# Patient Record
Sex: Female | Born: 1950 | Race: White | Hispanic: No | Marital: Married | Smoking: Never smoker
Health system: Southern US, Community
[De-identification: ages and names within clinical notes are randomized; demographics above are authoritative.]

## PROBLEM LIST (undated history)

## (undated) DIAGNOSIS — E119 Type 2 diabetes mellitus without complications: Secondary | ICD-10-CM

## (undated) DIAGNOSIS — M1990 Unspecified osteoarthritis, unspecified site: Secondary | ICD-10-CM

## (undated) DIAGNOSIS — R05 Cough: Secondary | ICD-10-CM

## (undated) DIAGNOSIS — R062 Wheezing: Secondary | ICD-10-CM

## (undated) DIAGNOSIS — E039 Hypothyroidism, unspecified: Secondary | ICD-10-CM

## (undated) DIAGNOSIS — F329 Major depressive disorder, single episode, unspecified: Secondary | ICD-10-CM

## (undated) DIAGNOSIS — G459 Transient cerebral ischemic attack, unspecified: Secondary | ICD-10-CM

## (undated) DIAGNOSIS — K219 Gastro-esophageal reflux disease without esophagitis: Secondary | ICD-10-CM

## (undated) DIAGNOSIS — R0600 Dyspnea, unspecified: Secondary | ICD-10-CM

## (undated) DIAGNOSIS — S82001A Unspecified fracture of right patella, initial encounter for closed fracture: Secondary | ICD-10-CM

## (undated) DIAGNOSIS — R0789 Other chest pain: Secondary | ICD-10-CM

## (undated) DIAGNOSIS — I10 Essential (primary) hypertension: Secondary | ICD-10-CM

## (undated) DIAGNOSIS — Z8719 Personal history of other diseases of the digestive system: Secondary | ICD-10-CM

## (undated) DIAGNOSIS — H9190 Unspecified hearing loss, unspecified ear: Secondary | ICD-10-CM

## (undated) DIAGNOSIS — J189 Pneumonia, unspecified organism: Secondary | ICD-10-CM

## (undated) DIAGNOSIS — I499 Cardiac arrhythmia, unspecified: Secondary | ICD-10-CM

## (undated) DIAGNOSIS — E785 Hyperlipidemia, unspecified: Secondary | ICD-10-CM

## (undated) DIAGNOSIS — I639 Cerebral infarction, unspecified: Secondary | ICD-10-CM

## (undated) DIAGNOSIS — E079 Disorder of thyroid, unspecified: Secondary | ICD-10-CM

## (undated) DIAGNOSIS — R519 Headache, unspecified: Secondary | ICD-10-CM

## (undated) DIAGNOSIS — E7800 Pure hypercholesterolemia, unspecified: Secondary | ICD-10-CM

## (undated) DIAGNOSIS — D649 Anemia, unspecified: Secondary | ICD-10-CM

## (undated) DIAGNOSIS — R740 Nonspecific elevation of levels of transaminase and lactic acid dehydrogenase [LDH]: Secondary | ICD-10-CM

## (undated) DIAGNOSIS — G5762 Lesion of plantar nerve, left lower limb: Secondary | ICD-10-CM

## (undated) DIAGNOSIS — E669 Obesity, unspecified: Secondary | ICD-10-CM

## (undated) DIAGNOSIS — J3089 Other allergic rhinitis: Secondary | ICD-10-CM

## (undated) DIAGNOSIS — K449 Diaphragmatic hernia without obstruction or gangrene: Secondary | ICD-10-CM

## (undated) DIAGNOSIS — R7401 Elevation of levels of liver transaminase levels: Secondary | ICD-10-CM

## (undated) DIAGNOSIS — Z87410 Personal history of cervical dysplasia: Secondary | ICD-10-CM

## (undated) DIAGNOSIS — R053 Chronic cough: Secondary | ICD-10-CM

## (undated) DIAGNOSIS — F32A Depression, unspecified: Secondary | ICD-10-CM

## (undated) DIAGNOSIS — R51 Headache: Secondary | ICD-10-CM

## (undated) DIAGNOSIS — F0390 Unspecified dementia without behavioral disturbance: Secondary | ICD-10-CM

## (undated) DIAGNOSIS — G43809 Other migraine, not intractable, without status migrainosus: Secondary | ICD-10-CM

## (undated) DIAGNOSIS — J40 Bronchitis, not specified as acute or chronic: Secondary | ICD-10-CM

## (undated) DIAGNOSIS — I82409 Acute embolism and thrombosis of unspecified deep veins of unspecified lower extremity: Secondary | ICD-10-CM

## (undated) HISTORY — DX: Other chest pain: R07.89

## (undated) HISTORY — DX: Depression, unspecified: F32.A

## (undated) HISTORY — DX: Personal history of cervical dysplasia: Z87.410

## (undated) HISTORY — DX: Disorder of thyroid, unspecified: E07.9

## (undated) HISTORY — DX: Headache, unspecified: R51.9

## (undated) HISTORY — DX: Nonspecific elevation of levels of transaminase and lactic acid dehydrogenase (ldh): R74.0

## (undated) HISTORY — DX: Obesity, unspecified: E66.9

## (undated) HISTORY — DX: Elevation of levels of liver transaminase levels: R74.01

## (undated) HISTORY — DX: Other migraine, not intractable, without status migrainosus: G43.809

## (undated) HISTORY — DX: Unspecified osteoarthritis, unspecified site: M19.90

## (undated) HISTORY — DX: Unspecified fracture of right patella, initial encounter for closed fracture: S82.001A

## (undated) HISTORY — PX: SPINE SURGERY: SHX786

## (undated) HISTORY — DX: Type 2 diabetes mellitus without complications: E11.9

## (undated) HISTORY — DX: Transient cerebral ischemic attack, unspecified: G45.9

## (undated) HISTORY — PX: TRIGGER FINGER RELEASE: SHX641

## (undated) HISTORY — PX: TUBAL LIGATION: SHX77

## (undated) HISTORY — PX: JOINT REPLACEMENT: SHX530

## (undated) HISTORY — DX: Major depressive disorder, single episode, unspecified: F32.9

## (undated) HISTORY — DX: Lesion of plantar nerve, left lower limb: G57.62

## (undated) HISTORY — DX: Headache: R51

## (undated) HISTORY — PX: BACK SURGERY: SHX140

## (undated) HISTORY — PX: TONSILLECTOMY: SHX5217

## (undated) HISTORY — DX: Hyperlipidemia, unspecified: E78.5

## (undated) HISTORY — PX: HAND SURGERY: SHX662

---

## 1993-04-28 HISTORY — PX: ESOPHAGOGASTRODUODENOSCOPY: SHX1529

## 2001-12-07 HISTORY — PX: CERVICAL BIOPSY  W/ LOOP ELECTRODE EXCISION: SUR135

## 2005-02-06 ENCOUNTER — Emergency Department: Payer: Self-pay | Admitting: General Practice

## 2005-02-06 ENCOUNTER — Other Ambulatory Visit: Payer: Self-pay

## 2005-02-12 ENCOUNTER — Emergency Department: Payer: Self-pay | Admitting: Unknown Physician Specialty

## 2005-02-12 ENCOUNTER — Other Ambulatory Visit: Payer: Self-pay

## 2006-08-17 ENCOUNTER — Ambulatory Visit: Payer: Self-pay | Admitting: Gastroenterology

## 2006-08-30 ENCOUNTER — Other Ambulatory Visit: Payer: Self-pay

## 2006-08-30 ENCOUNTER — Inpatient Hospital Stay: Payer: Self-pay | Admitting: Internal Medicine

## 2006-08-31 DIAGNOSIS — I639 Cerebral infarction, unspecified: Secondary | ICD-10-CM

## 2006-08-31 HISTORY — DX: Cerebral infarction, unspecified: I63.9

## 2007-05-31 ENCOUNTER — Ambulatory Visit: Payer: Self-pay | Admitting: Internal Medicine

## 2007-12-18 ENCOUNTER — Emergency Department: Payer: Self-pay | Admitting: Emergency Medicine

## 2008-09-05 ENCOUNTER — Ambulatory Visit: Payer: Self-pay | Admitting: Internal Medicine

## 2008-12-24 ENCOUNTER — Emergency Department: Payer: Self-pay | Admitting: Emergency Medicine

## 2009-05-14 ENCOUNTER — Ambulatory Visit: Payer: Self-pay

## 2009-10-06 ENCOUNTER — Emergency Department: Payer: Self-pay | Admitting: Emergency Medicine

## 2009-12-18 ENCOUNTER — Emergency Department: Payer: Self-pay | Admitting: Emergency Medicine

## 2010-07-07 ENCOUNTER — Emergency Department: Payer: Self-pay | Admitting: Emergency Medicine

## 2010-10-14 ENCOUNTER — Ambulatory Visit: Payer: Self-pay | Admitting: Internal Medicine

## 2012-04-29 ENCOUNTER — Emergency Department: Payer: Self-pay | Admitting: Emergency Medicine

## 2013-07-14 ENCOUNTER — Ambulatory Visit: Payer: Self-pay | Admitting: Neurology

## 2013-12-07 DIAGNOSIS — E119 Type 2 diabetes mellitus without complications: Secondary | ICD-10-CM | POA: Insufficient documentation

## 2013-12-07 DIAGNOSIS — I10 Essential (primary) hypertension: Secondary | ICD-10-CM | POA: Insufficient documentation

## 2013-12-07 DIAGNOSIS — M1990 Unspecified osteoarthritis, unspecified site: Secondary | ICD-10-CM | POA: Insufficient documentation

## 2013-12-07 DIAGNOSIS — E7800 Pure hypercholesterolemia, unspecified: Secondary | ICD-10-CM | POA: Insufficient documentation

## 2013-12-07 DIAGNOSIS — D649 Anemia, unspecified: Secondary | ICD-10-CM | POA: Insufficient documentation

## 2013-12-07 DIAGNOSIS — E039 Hypothyroidism, unspecified: Secondary | ICD-10-CM | POA: Insufficient documentation

## 2014-03-05 DIAGNOSIS — R262 Difficulty in walking, not elsewhere classified: Secondary | ICD-10-CM | POA: Insufficient documentation

## 2014-03-05 DIAGNOSIS — E669 Obesity, unspecified: Secondary | ICD-10-CM | POA: Insufficient documentation

## 2014-03-05 DIAGNOSIS — R413 Other amnesia: Secondary | ICD-10-CM | POA: Insufficient documentation

## 2014-03-05 DIAGNOSIS — G479 Sleep disorder, unspecified: Secondary | ICD-10-CM | POA: Insufficient documentation

## 2014-06-04 DIAGNOSIS — IMO0002 Reserved for concepts with insufficient information to code with codable children: Secondary | ICD-10-CM | POA: Insufficient documentation

## 2014-06-04 DIAGNOSIS — E119 Type 2 diabetes mellitus without complications: Secondary | ICD-10-CM | POA: Insufficient documentation

## 2014-06-04 DIAGNOSIS — Z794 Long term (current) use of insulin: Secondary | ICD-10-CM

## 2014-06-27 ENCOUNTER — Emergency Department: Payer: Self-pay | Admitting: Emergency Medicine

## 2014-06-27 LAB — COMPREHENSIVE METABOLIC PANEL
Albumin: 3.4 g/dL (ref 3.4–5.0)
Alkaline Phosphatase: 103 U/L
Anion Gap: 9 (ref 7–16)
BUN: 12 mg/dL (ref 7–18)
Bilirubin,Total: 0.3 mg/dL (ref 0.2–1.0)
Calcium, Total: 8.2 mg/dL — ABNORMAL LOW (ref 8.5–10.1)
Chloride: 108 mmol/L — ABNORMAL HIGH (ref 98–107)
Co2: 25 mmol/L (ref 21–32)
Creatinine: 0.77 mg/dL (ref 0.60–1.30)
EGFR (African American): >60
EGFR (Non-African Amer.): >60
Glucose: 123 mg/dL — ABNORMAL HIGH (ref 65–99)
Osmolality: 284 (ref 275–301)
Potassium: 3.6 mmol/L (ref 3.5–5.1)
SGOT(AST): 36 U/L (ref 15–37)
SGPT (ALT): 48 U/L
Sodium: 142 mmol/L (ref 136–145)
Total Protein: 7 g/dL (ref 6.4–8.2)

## 2014-06-27 LAB — CBC
HCT: 38.3 % (ref 35.0–47.0)
HGB: 12 g/dL (ref 12.0–16.0)
MCH: 27.2 pg (ref 26.0–34.0)
MCHC: 31.4 g/dL — ABNORMAL LOW (ref 32.0–36.0)
MCV: 87 fL (ref 80–100)
Platelet: 255 10*3/uL (ref 150–440)
RBC: 4.42 10*6/uL (ref 3.80–5.20)
RDW: 14.9 % — ABNORMAL HIGH (ref 11.5–14.5)
WBC: 7.9 10*3/uL (ref 3.6–11.0)

## 2014-06-27 LAB — TROPONIN I: Troponin-I: <0.02 ng/mL

## 2014-08-21 ENCOUNTER — Emergency Department: Payer: Self-pay | Admitting: Emergency Medicine

## 2014-08-21 LAB — CBC
HCT: 39 % (ref 35.0–47.0)
HGB: 12.5 g/dL (ref 12.0–16.0)
MCH: 27.5 pg (ref 26.0–34.0)
MCHC: 32 g/dL (ref 32.0–36.0)
MCV: 86 fL (ref 80–100)
Platelet: 283 10*3/uL (ref 150–440)
RBC: 4.54 10*6/uL (ref 3.80–5.20)
RDW: 14.7 % — ABNORMAL HIGH (ref 11.5–14.5)
WBC: 8.7 10*3/uL (ref 3.6–11.0)

## 2014-08-21 LAB — PRO B NATRIURETIC PEPTIDE: B-Type Natriuretic Peptide: 104 pg/mL (ref 0–125)

## 2014-08-21 LAB — BASIC METABOLIC PANEL
Anion Gap: 8 (ref 7–16)
BUN: 11 mg/dL (ref 7–18)
CO2: 26 mmol/L (ref 21–32)
CREATININE: 0.8 mg/dL (ref 0.60–1.30)
Calcium, Total: 8.3 mg/dL — ABNORMAL LOW (ref 8.5–10.1)
Chloride: 105 mmol/L (ref 98–107)
EGFR (Non-African Amer.): >60
GLUCOSE: 100 mg/dL — AB (ref 65–99)
OSMOLALITY: 277 (ref 275–301)
POTASSIUM: 3.8 mmol/L (ref 3.5–5.1)
Sodium: 139 mmol/L (ref 136–145)

## 2014-08-21 LAB — TROPONIN I: Troponin-I: <0.02 ng/mL

## 2015-03-11 DIAGNOSIS — F329 Major depressive disorder, single episode, unspecified: Secondary | ICD-10-CM | POA: Insufficient documentation

## 2015-03-11 DIAGNOSIS — F32A Depression, unspecified: Secondary | ICD-10-CM | POA: Insufficient documentation

## 2015-04-01 ENCOUNTER — Ambulatory Visit (INDEPENDENT_AMBULATORY_CARE_PROVIDER_SITE_OTHER): Payer: Medicare Other | Admitting: Licensed Clinical Social Worker

## 2015-04-01 DIAGNOSIS — F411 Generalized anxiety disorder: Secondary | ICD-10-CM

## 2015-04-01 NOTE — Progress Notes (Signed)
Patient:   Gina Tate   DOB:   Jan 22, 1951  MR Number:  [id number]  Location:  [facility] [address] [place] [address] Dept: [phone]           Date of Service:   04/01/2015  Start Time:   9:02am End Time:   10:05 am  Provider/Observer:  Gina Tate Counselor       Billing Code/Service: 46962  Behavioral Observation: Gina Tate  presents as a 64 y.o.-year-old Caucasian Female who appeared her stated age. her dress was appropriate and she was Casual and her manners were appropriate to the situation.  Gina Tate used a cane to assist her with walking.  she displayed an appropriate level of cooperation and motivation.    Interactions:    Active   Attention:   within normal limits  Memory:   within normal limits  Speech (Volume):  normal  Speech:   normal volume  Thought Process:  Coherent  Though Content:  WNL  Orientation:   person, place, time/date, situation, day of week and month of year  Judgment:   Fair  Planning:   Fair  Affect:    Depressed and Flat  Mood:    Depressed and Worthless  Insight:   Fair  Intelligence:   average  Chief Complaint:     Chief Complaint  Patient presents with  . Depression  . Family Problem  . Establish Care    Reason for Service:  To increase her energy, assist with weight reduction and increase sleep  Current Symptoms:  Reduce energy, weight gain, increase sleep, crying spells, sadness, fatigue, lack of motivation, low self esteem  Source of Distress:              unknown  Marital Status/Living: Lives in [place] with her husband  Employment History: Currently employed part time at [employer] for the past 24 years  Education:   GED  Legal History:  Denies current or past legal history.  Gina Tate reports that she has had difficulty with the IRS for the past 4 years.  Military  Experience:  denies   Religious/Spiritual Preferences:  Did not disclose    Children/Grand-children:    Gina Tate has three children Gina Tate [address], Gina Tate, & Gina Tate 37) and one living stepdaughter Gina Tate 40).  Gina Tate has been married for the past 18 years.  Natural/Informal Support:                           Reports that her children are supportive   Substance Use:  No concerns of substance abuse are reported.     Medical History:  No past medical history on file.        Medication List    Notice  As of 04/01/2015 11:20 AM   You have not been prescribed any medications.            Sexual History:   History  Sexual Activity  . Sexual Activity: Not on file     Abuse/Trauma History: Asees denies abuse or trauma      Psychiatric History:  Gina Tate has taken depression medication for several years.  She denies attending therapy or in patient treatment.   Strengths:   Hardworking, loving  Recovery Goals:  "To feel better"  Hobbies/Interests:               Reading,  sewing, interacting with her childre   Challenges/Barriers: Inability to sleep throughout the night, forgetful, lacks motivation    Family Med/Psych History: No family history on file.  Risk of Suicide/Violence: low     History of Suicide/Violence:  denies  Psychosis:   denies  Diagnosis:    Major Depression, Recurrent, Moderate  Impression/DX:  Gina Tate is a 64 year old female who was referred to [facility] by her Primary Care Physician.  Gina Tate endorses the following symptoms reduced energy, weight gain, increase sleep, crying spells, sadness, fatigue, lack of motivation and low self esteem.  She will best be assisted through CBT and motivational interviewing to address her current mood.  Gina Tate works part time and obtained her GED approximately ten years ago.  She is currently married and has three daughters and one step daughter.  Her step son died about twenty years ago due to medical concerns. Gina Tate wants to  improve the relationship she has with her husband who rarely communicates with her and her children.  Gina Tate is currently concerned with her IRS struggles.  Gina Tate's identify was mistaken several years ago in [name].  She currently owes about $1500 in taxes but reports that she does not have the money to pay or nor did she or her husband win any money.  Gina Tate continues to have medical and dental concerns that need to be addressed.  At this time Writer will recommend medication management and OPT to assist with recovery.  Recommendation/Plan: Writer recommends Outpatient Therapy at least twice monthly to include but not limited to individual, group and or family therapy.  Medication Management is also recommended to assist with her mood.

## 2015-04-02 ENCOUNTER — Ambulatory Visit: Payer: Medicare Other | Attending: Neurology

## 2015-04-02 DIAGNOSIS — E669 Obesity, unspecified: Secondary | ICD-10-CM | POA: Diagnosis not present

## 2015-04-02 DIAGNOSIS — Z8673 Personal history of transient ischemic attack (TIA), and cerebral infarction without residual deficits: Secondary | ICD-10-CM | POA: Insufficient documentation

## 2015-04-02 DIAGNOSIS — I10 Essential (primary) hypertension: Secondary | ICD-10-CM | POA: Diagnosis not present

## 2015-04-02 DIAGNOSIS — E119 Type 2 diabetes mellitus without complications: Secondary | ICD-10-CM | POA: Insufficient documentation

## 2015-04-02 DIAGNOSIS — G4733 Obstructive sleep apnea (adult) (pediatric): Secondary | ICD-10-CM | POA: Insufficient documentation

## 2015-04-02 DIAGNOSIS — E079 Disorder of thyroid, unspecified: Secondary | ICD-10-CM | POA: Diagnosis not present

## 2015-04-02 DIAGNOSIS — G479 Sleep disorder, unspecified: Secondary | ICD-10-CM | POA: Diagnosis present

## 2015-04-02 DIAGNOSIS — K219 Gastro-esophageal reflux disease without esophagitis: Secondary | ICD-10-CM | POA: Insufficient documentation

## 2015-04-02 DIAGNOSIS — G4761 Periodic limb movement disorder: Secondary | ICD-10-CM | POA: Insufficient documentation

## 2015-04-02 DIAGNOSIS — G4710 Hypersomnia, unspecified: Secondary | ICD-10-CM | POA: Diagnosis not present

## 2015-04-02 DIAGNOSIS — E785 Hyperlipidemia, unspecified: Secondary | ICD-10-CM | POA: Diagnosis not present

## 2015-04-15 ENCOUNTER — Ambulatory Visit: Payer: Self-pay | Admitting: Psychiatry

## 2015-04-15 ENCOUNTER — Ambulatory Visit: Payer: Self-pay | Admitting: Licensed Clinical Social Worker

## 2015-04-15 ENCOUNTER — Ambulatory Visit (INDEPENDENT_AMBULATORY_CARE_PROVIDER_SITE_OTHER): Payer: Medicare Other | Admitting: Licensed Clinical Social Worker

## 2015-04-15 DIAGNOSIS — F32A Depression, unspecified: Secondary | ICD-10-CM

## 2015-04-15 DIAGNOSIS — F329 Major depressive disorder, single episode, unspecified: Secondary | ICD-10-CM

## 2015-04-16 NOTE — Progress Notes (Signed)
   THERAPIST PROGRESS NOTE  Session Time:1p  Participation Level: Active  Behavioral Response: NeatAlertDepressed  Type of Therapy: Individual Therapy  Treatment Goals addressed: Coping  Interventions: CBT, Strength-based, Supportive and Reframing  Summary: Gina Tate is a 64 y.o. female who presents with depressive symptoms.  She continues to have difficulty with communicating with her husband and her adult children.  She has difficulty with sleeping throughout the night and is minimally socially active.   Suicidal/Homicidal: No  Therapist Response: Therapist encouraged Patient to take medication daily and to interact with friends at least every other day.  Therapist encouraged Patient to communicate with her husband daily.Therapist encouraged Patient to continue to attend OPT session.  Therapist gave homework assignment to write in journal.  Plan: Return again in 2 weeks.  Diagnosis: Axis I: Depressive Disorder NOS    Axis II: No diagnosis    [name] 04/16/2015

## 2015-04-18 ENCOUNTER — Ambulatory Visit: Payer: Self-pay | Admitting: Psychiatry

## 2015-04-29 ENCOUNTER — Ambulatory Visit (INDEPENDENT_AMBULATORY_CARE_PROVIDER_SITE_OTHER): Payer: Medicare Other | Admitting: Licensed Clinical Social Worker

## 2015-04-29 DIAGNOSIS — F329 Major depressive disorder, single episode, unspecified: Secondary | ICD-10-CM

## 2015-04-29 DIAGNOSIS — F32A Depression, unspecified: Secondary | ICD-10-CM

## 2015-04-29 HISTORY — PX: TRIGGER FINGER RELEASE: SHX641

## 2015-04-29 NOTE — Progress Notes (Signed)
   THERAPIST PROGRESS NOTE  Session Time: 3p  Participation Level: Active  Behavioral Response: CasualAlertIrritable  Type of Therapy: Individual Therapy  Treatment Goals addressed: Coping  Interventions: Motivational Interviewing  Summary: Gina Tate is a 64 y.o. female who presents with continued depressive symptoms. [name] had surgery on her right hand at 1130am on this day.  Although [name] was alert and oriented she appeared in pain.  Isidra continues to struggle with communicating with her husband and adult children.  [name] sleep patterns are getting better.  She feels well rested after waking up.  [name] has a good, strong support system through friends.   Suicidal/Homicidal: Nowithout intent/plan  Therapist Response: Writer was supportive and active throughout the session.  Writer shortened the session to 30 minutes due to her having surgery earlier today.  Although [name] denied pain she rubbed her hand several times during the session.  Writer will continue to provide OPT techniques to assist with depressive mood.  Plan: Return again in 2 weeks.  Diagnosis: Axis I: Depressive Disorder NOS    Axis II: No diagnosis    [name] 04/29/2015

## 2015-05-02 ENCOUNTER — Ambulatory Visit: Payer: Self-pay | Admitting: Psychiatry

## 2015-05-13 ENCOUNTER — Ambulatory Visit (INDEPENDENT_AMBULATORY_CARE_PROVIDER_SITE_OTHER): Payer: Medicare Other | Admitting: Licensed Clinical Social Worker

## 2015-05-13 DIAGNOSIS — F329 Major depressive disorder, single episode, unspecified: Secondary | ICD-10-CM | POA: Diagnosis not present

## 2015-05-13 DIAGNOSIS — F32A Depression, unspecified: Secondary | ICD-10-CM

## 2015-05-13 NOTE — Progress Notes (Signed)
   THERAPIST PROGRESS NOTE  Session Time:9a  Participation Level: Active  Behavioral Response: CasualAlertIrritable  Type of Therapy: Individual Therapy  Treatment Goals addressed: Coping  Interventions: CBT, Motivational Interviewing and Reframing  Summary: JERALDINE PRIMEAU is a 64 y.o. female who presents with continued depressive symptoms such as lack of sleep and appetite.  She continues to have a gloomy outlook and lack of interest in daily activities.  Patient does interact with one friend once weekly.  Currently Patient is worried about 15 year old Grandson who broke his arm in two places last week.  He recently had surgery.  Patient was open to communication styles and patient asked questions until she comprehended.   Suicidal/Homicidal: Nowithout intent/plan  Therapist Response: Therapist actively listened as offered suggestion on several coping styles.  Therapist performed role play exercise.  Plan: Return again in 2 weeks.  Diagnosis: Axis I: Major Depression, Recurrent, Moderate    Axis II: No diagnosis    [name] 05/13/2015

## 2015-05-23 ENCOUNTER — Ambulatory Visit: Payer: Self-pay | Admitting: Psychiatry

## 2015-05-27 ENCOUNTER — Ambulatory Visit (INDEPENDENT_AMBULATORY_CARE_PROVIDER_SITE_OTHER): Payer: Medicare Other | Admitting: Licensed Clinical Social Worker

## 2015-05-27 DIAGNOSIS — F329 Major depressive disorder, single episode, unspecified: Secondary | ICD-10-CM | POA: Diagnosis not present

## 2015-05-27 DIAGNOSIS — F32A Depression, unspecified: Secondary | ICD-10-CM

## 2015-05-28 NOTE — Progress Notes (Signed)
   THERAPIST PROGRESS NOTE  Session Time:  Participation Level: Active  Behavioral Response: CasualAlertDepressed  Type of Therapy: Individual Therapy  Treatment Goals addressed: Coping  Interventions: CBT, Solution Focused, Supportive and Reframing  Summary: Gina Tate is a 64 y.o. female who presents with continued symptoms of her diagnosis.  She continues to have difficulty with sleeping and lack of motivation.  [name] has become social able with her friends.  She is currently scheduling a day trip to the mountains with her husband and another couple.  She reports no change in the communication between she and her husband. She gets tearful while discussing her IRS troubles.  She continues to work but gets frustated and irritable while at work.   Suicidal/Homicidal: Nowithout intent/plan  Therapist Response: Therapist was able to assist Patient with coping strategies and to continue to learn about her diagnosis.  She has not attended Medication Management due to continued canceled appointments.  Therapist provided Patient with homework; journaling. Patient to return in 4 weeks due to prior obligations.  Plan: Return again in 4 weeks.  Diagnosis: Axis I: Depressive Disorder NOS    Axis II: No diagnosis    [name] 05/28/2015

## 2015-06-03 ENCOUNTER — Encounter: Payer: Self-pay | Admitting: Psychiatry

## 2015-06-03 ENCOUNTER — Ambulatory Visit (INDEPENDENT_AMBULATORY_CARE_PROVIDER_SITE_OTHER): Payer: Medicare Other | Admitting: Psychiatry

## 2015-06-03 VITALS — BP 140/88 | HR 103 | Temp 98.4°F | Ht 66.0 in | Wt 244.6 lb

## 2015-06-03 DIAGNOSIS — F331 Major depressive disorder, recurrent, moderate: Secondary | ICD-10-CM

## 2015-06-03 DIAGNOSIS — F411 Generalized anxiety disorder: Secondary | ICD-10-CM | POA: Diagnosis not present

## 2015-06-03 MED ORDER — VENLAFAXINE HCL 75 MG PO TABS: 75.0000 mg | ORAL_TABLET | Freq: Every day | ORAL | Status: DC

## 2015-06-03 MED ORDER — DONEPEZIL HCL 10 MG PO TABS: 10.0000 mg | ORAL_TABLET | Freq: Every day | ORAL | Status: DC

## 2015-06-03 MED ORDER — NORTRIPTYLINE HCL 10 MG PO CAPS: 10.0000 mg | ORAL_CAPSULE | Freq: Every day | ORAL | Status: DC
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
## 2015-07-01 ENCOUNTER — Ambulatory Visit (INDEPENDENT_AMBULATORY_CARE_PROVIDER_SITE_OTHER): Payer: Medicare Other | Admitting: Licensed Clinical Social Worker

## 2015-07-01 DIAGNOSIS — F331 Major depressive disorder, recurrent, moderate: Secondary | ICD-10-CM
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
## 2015-07-04 ENCOUNTER — Ambulatory Visit (INDEPENDENT_AMBULATORY_CARE_PROVIDER_SITE_OTHER): Payer: Medicare Other | Admitting: Psychiatry

## 2015-07-04 ENCOUNTER — Encounter: Payer: Self-pay | Admitting: Psychiatry

## 2015-07-04 VITALS — BP 124/86 | HR 94 | Temp 98.6°F | Ht 66.0 in | Wt 248.4 lb

## 2015-07-04 DIAGNOSIS — F411 Generalized anxiety disorder: Secondary | ICD-10-CM | POA: Diagnosis not present

## 2015-07-04 DIAGNOSIS — F331 Major depressive disorder, recurrent, moderate: Secondary | ICD-10-CM | POA: Diagnosis not present

## 2015-07-04 MED ORDER — NORTRIPTYLINE HCL 10 MG PO CAPS: 10.0000 mg | ORAL_CAPSULE | Freq: Every day | ORAL | Status: DC

## 2015-07-04 MED ORDER — VENLAFAXINE HCL 75 MG PO TABS: 75.0000 mg | ORAL_TABLET | Freq: Every day | ORAL | Status: DC

## 2015-07-04 MED ORDER — DONEPEZIL HCL 10 MG PO TABS: 10.0000 mg | ORAL_TABLET | Freq: Every day | ORAL | Status: DC

## 2015-07-04 NOTE — Progress Notes (Signed)
BH MD/PA/NP OP Progress Note  07/04/2015 2:46 PM Gina Tate  MRN:  [id number]  Subjective:  Patient returns for follow-up of her major depressive disorder recurrent moderate and generalized anxiety disorder. She is very cheerful today. She indicated that her oldest granddaughter just got married at the middle of last month and she was able to enjoy that. She states she is now ambulating without her cane and this makes her feel good. He does state one stressor some ongoing issues with the IRS demanding payment for some alleged winnings. Patient states she's sleeping pretty well. She states her appetite is unchanged. She feels good on the medications and denies any side effects.  Chief Complaint: IRS Chief Complaint    Follow-up; Medication Refill     Visit Diagnosis:     ICD-9-CM ICD-10-CM   1. Major depressive disorder, recurrent episode, moderate (HCC) 296.32 F33.1   2. Generalized anxiety disorder 300.02 F41.1     Past Medical History:  Past Medical History  Diagnosis Date  . Depression   . Thyroid disease   . Diabetes mellitus, type II (HCC)   . Headache     Past Surgical History  Procedure Laterality Date  . Back surgery    . Tonsillectomy    . Hand surgery Right   . Trigger finger release Right    Family History:  Family History  Problem Relation Age of Onset  . Anemia Mother   . Alzheimer's disease Mother   . Heart attack Father   . Angina Father   . Alzheimer's disease Father   . Depression Father   . Alcohol abuse Father   . Diabetes Sister   . Heart disease Brother   . Bipolar disorder Brother   . Dementia Sister   . Diabetes Brother   . Heart attack Brother   . Heart disease Brother   . Cancer Brother    Social History:  Social History   Social History  . Marital Status: Married    Spouse Name: N/A  . Number of Children: N/A  . Years of Education: N/A   Social History Main Topics  . Smoking status: Never Smoker   . Smokeless tobacco: Never  Used  . Alcohol Use: No  . Drug Use: No  . Sexual Activity: Not Currently   Other Topics Concern  . None   Social History Narrative   Additional History:   Assessment:   Musculoskeletal: Strength & Muscle Tone: within normal limits Gait & Station: normal Patient leans: N/A  Psychiatric Specialty Exam: HPI  Review of Systems  Psychiatric/Behavioral: Negative for depression, suicidal ideas, hallucinations, memory loss and substance abuse. The patient is not nervous/anxious and does not have insomnia.   All other systems reviewed and are negative.   Blood pressure 124/86, pulse 94, temperature 98.6 F (37 C), temperature source Tympanic, height 5\' 6"  (1.676 m), weight 248 lb 6.4 oz (112.674 kg), SpO2 94 %.Body mass index is 40.11 kg/(m^2).  General Appearance: Well Groomed  Eye Contact:  Good  Speech:  Normal Rate  Volume:  Normal  Mood:  Good  Affect:  Congruent Bright, smiling able to laugh   Thought Process:  Linear  Orientation:  Full (Time, Place, and Person)  Thought Content:  Negative  Suicidal Thoughts:  No  Homicidal Thoughts:  No  Memory:  Immediate;   Good Recent;   Good Remote;   Good  Judgement:  Good  Insight:  Good  Psychomotor Activity:  Negative  Concentration:  Good  Recall:  Good  Fund of Knowledge: Good  Language: Good  Akathisia:  Negative  Handed:    AIMS (if indicated):    Assets:  Communication Skills Desire for Improvement Social Support Vocational/Educational  ADL's:  Intact  Cognition: WNL  Sleep:  good   Is the patient at risk to self?  No. Has the patient been a risk to self in the past 6 months?  No. Has the patient been a risk to self within the distant past?  Yes.   Is the patient a risk to others?  No. Has the patient been a risk to others in the past 6 months?  No. Has the patient been a risk to others within the distant past?  No.  Current Medications: Current Outpatient Prescriptions  Medication Sig Dispense Refill   . aspirin EC 81 MG tablet Take 81 mg by mouth daily.    Marland Kitchen atenolol (TENORMIN) 25 MG tablet Take 25 mg by mouth daily.  0  . atorvastatin (LIPITOR) 10 MG tablet Take by mouth.    . Cyanocobalamin (RA VITAMIN B-12 TR) 1000 MCG TBCR Take by mouth.    . donepezil (ARICEPT) 10 MG tablet Take 1 tablet (10 mg total) by mouth at bedtime. 30 tablet 2  . gabapentin (NEURONTIN) 300 MG capsule take 1 capsule by mouth at bedtime for 1 week then 2 capsules for 1 week then 3 capsules at bedtime  0  . insulin NPH-regular Human (NOVOLIN 70/30) (70-30) 100 UNIT/ML injection Inject 35 units q am and inject 45 units q pm    . lansoprazole (PREVACID) 15 MG capsule Take by mouth.    . levothyroxine (SYNTHROID, LEVOTHROID) 137 MCG tablet Take by mouth.    . losartan (COZAAR) 50 MG tablet   0  . metFORMIN (GLUCOPHAGE) 1000 MG tablet Take by mouth.    . nortriptyline (PAMELOR) 10 MG capsule Take 1 capsule (10 mg total) by mouth at bedtime. 30 capsule 2  . venlafaxine (EFFEXOR) 75 MG tablet Take 1 tablet (75 mg total) by mouth daily. 30 tablet 2   No current facility-administered medications for this visit.    Medical Decision Making:  Established Problem, Stable/Improving (1) and Review of Medication Regimen & Side Effects (2)  Treatment Plan Summary:Medication management and Plan   Major depressive disorder, recurrent, moderate. Patient has been on her venlafaxine for 1 year and nortriptyline for 2 or 3 months. She reports this has improved her mood. Patient has been on Aricept by her neurologist reports this is also helped her issues with poor memory. Patient is reporting good benefit from the current regimen as such we will continue her on this. She will continue on venlafaxine 75 mg a day, nortriptyline 10 mg at bedtime and donepezil 10 mg at bedtime. Risk and benefits of medications reviewed in patient's able to consent.  Generalized anxiety disorder-continue venlafaxine as above.  Patient will follow up in  10 weeks. She's been encouraged call with any questions or concerns prior to her next appointment.    Wallace Going 07/04/2015, 2:46 PM

## 2015-07-31 ENCOUNTER — Ambulatory Visit: Payer: Self-pay | Admitting: Licensed Clinical Social Worker

## 2015-09-12 ENCOUNTER — Ambulatory Visit: Payer: Medicare Other | Admitting: Psychiatry

## 2015-10-07 ENCOUNTER — Ambulatory Visit: Payer: Medicare Other | Admitting: Anesthesiology

## 2015-10-07 ENCOUNTER — Encounter
Admission: RE | Disposition: A | Discharge status: Home / Self Care | Payer: Self-pay | Source: Ambulatory Visit | Attending: Gastroenterology

## 2015-10-07 ENCOUNTER — Encounter: Payer: Self-pay | Admitting: *Deleted

## 2015-10-07 ENCOUNTER — Ambulatory Visit
Admission: RE | Admit: 2015-10-07 | Discharge: 2015-10-07 | Disposition: A | Discharge status: Home / Self Care | Payer: Medicare Other | Source: Ambulatory Visit | Attending: Gastroenterology | Admitting: Gastroenterology

## 2015-10-07 DIAGNOSIS — Z7984 Long term (current) use of oral hypoglycemic drugs: Secondary | ICD-10-CM | POA: Insufficient documentation

## 2015-10-07 DIAGNOSIS — E039 Hypothyroidism, unspecified: Secondary | ICD-10-CM | POA: Insufficient documentation

## 2015-10-07 DIAGNOSIS — R140 Abdominal distension (gaseous): Secondary | ICD-10-CM | POA: Insufficient documentation

## 2015-10-07 DIAGNOSIS — Z794 Long term (current) use of insulin: Secondary | ICD-10-CM | POA: Diagnosis not present

## 2015-10-07 DIAGNOSIS — E7800 Pure hypercholesterolemia, unspecified: Secondary | ICD-10-CM | POA: Insufficient documentation

## 2015-10-07 DIAGNOSIS — Z8673 Personal history of transient ischemic attack (TIA), and cerebral infarction without residual deficits: Secondary | ICD-10-CM | POA: Insufficient documentation

## 2015-10-07 DIAGNOSIS — R0789 Other chest pain: Secondary | ICD-10-CM | POA: Diagnosis not present

## 2015-10-07 DIAGNOSIS — E119 Type 2 diabetes mellitus without complications: Secondary | ICD-10-CM | POA: Insufficient documentation

## 2015-10-07 DIAGNOSIS — Z7982 Long term (current) use of aspirin: Secondary | ICD-10-CM | POA: Insufficient documentation

## 2015-10-07 DIAGNOSIS — M1990 Unspecified osteoarthritis, unspecified site: Secondary | ICD-10-CM | POA: Insufficient documentation

## 2015-10-07 DIAGNOSIS — G43909 Migraine, unspecified, not intractable, without status migrainosus: Secondary | ICD-10-CM | POA: Diagnosis not present

## 2015-10-07 DIAGNOSIS — K2950 Unspecified chronic gastritis without bleeding: Secondary | ICD-10-CM | POA: Insufficient documentation

## 2015-10-07 DIAGNOSIS — K210 Gastro-esophageal reflux disease with esophagitis: Secondary | ICD-10-CM | POA: Insufficient documentation

## 2015-10-07 DIAGNOSIS — E785 Hyperlipidemia, unspecified: Secondary | ICD-10-CM | POA: Diagnosis not present

## 2015-10-07 DIAGNOSIS — I10 Essential (primary) hypertension: Secondary | ICD-10-CM | POA: Diagnosis not present

## 2015-10-07 DIAGNOSIS — Z79899 Other long term (current) drug therapy: Secondary | ICD-10-CM | POA: Insufficient documentation

## 2015-10-07 DIAGNOSIS — Z6839 Body mass index (BMI) 39.0-39.9, adult: Secondary | ICD-10-CM | POA: Insufficient documentation

## 2015-10-07 DIAGNOSIS — F0390 Unspecified dementia without behavioral disturbance: Secondary | ICD-10-CM | POA: Insufficient documentation

## 2015-10-07 DIAGNOSIS — R1310 Dysphagia, unspecified: Secondary | ICD-10-CM | POA: Diagnosis not present

## 2015-10-07 HISTORY — DX: Essential (primary) hypertension: I10

## 2015-10-07 HISTORY — DX: Unspecified dementia, unspecified severity, without behavioral disturbance, psychotic disturbance, mood disturbance, and anxiety: F03.90

## 2015-10-07 HISTORY — DX: Pure hypercholesterolemia, unspecified: E78.00

## 2015-10-07 HISTORY — DX: Gastro-esophageal reflux disease without esophagitis: K21.9

## 2015-10-07 HISTORY — DX: Hypothyroidism, unspecified: E03.9

## 2015-10-07 HISTORY — DX: Personal history of other diseases of the digestive system: Z87.19

## 2015-10-07 HISTORY — PX: ESOPHAGOGASTRODUODENOSCOPY: SHX5428

## 2015-10-07 LAB — GLUCOSE, CAPILLARY: Glucose-Capillary: 184 mg/dL — ABNORMAL HIGH (ref 65–99)

## 2015-10-07 SURGERY — EGD (ESOPHAGOGASTRODUODENOSCOPY)
Anesthesia: General

## 2015-10-07 MED ORDER — GLYCOPYRROLATE 0.2 MG/ML IJ SOLN
INTRAMUSCULAR | Status: DC | PRN
  Administered 2015-10-07: 0.2 mg via INTRAVENOUS

## 2015-10-07 MED ORDER — SODIUM CHLORIDE 0.9 % IV SOLN: INTRAVENOUS | Status: DC

## 2015-10-07 MED ORDER — SODIUM CHLORIDE 0.9 % IV SOLN
INTRAVENOUS | Status: DC
  Administered 2015-10-07: 1000 mL via INTRAVENOUS

## 2015-10-07 MED ORDER — LIDOCAINE HCL (CARDIAC) 20 MG/ML IV SOLN
INTRAVENOUS | Status: DC | PRN
  Administered 2015-10-07: 100 mg via INTRAVENOUS

## 2015-10-07 MED ORDER — PROPOFOL 500 MG/50ML IV EMUL
INTRAVENOUS | Status: DC | PRN
Start: 2015-10-07 — End: 2015-10-07
  Administered 2015-10-07: 150 ug/kg/min via INTRAVENOUS

## 2015-10-07 MED ORDER — PROPOFOL 10 MG/ML IV BOLUS
INTRAVENOUS | Status: DC | PRN
  Administered 2015-10-07: 120 mg via INTRAVENOUS

## 2015-10-07 NOTE — H&P (Signed)
  Date of Initial H&P: 09/13/2015  History reviewed, patient examined, no change in status, stable for surgery. 

## 2015-10-07 NOTE — Transfer of Care (Signed)
Immediate Anesthesia Transfer of Care Note  Patient: Gina Tate  Procedure(s) Performed: Procedure(s): ESOPHAGOGASTRODUODENOSCOPY (EGD) (N/A)  Patient Location: Endoscopy Unit  Anesthesia Type:General  Level of Consciousness: awake and alert   Airway & Oxygen Therapy: Patient Spontanous Breathing and Patient connected to nasal cannula oxygen  Post-op Assessment: Report given to RN and Post -op Vital signs reviewed and stable  Post vital signs: Reviewed and stable  Last Vitals:  Filed Vitals:   10/07/15 0711  BP: 139/69  Pulse: 76  Temp: 36.6 C  Resp: 16    Complications: No apparent anesthesia complications

## 2015-10-07 NOTE — Op Note (Signed)
[facility] Gastroenterology Patient Name: Gina Tate Procedure Date: 10/07/2015 7:56 AM MRN: [id number] Account #: [id number] Date of Birth: 1951/03/07 Admit Type: Outpatient Age: 65 Room: [facility] ENDO ROOM 4 Gender: Female Note Status: Finalized Procedure:         Upper GI endoscopy Indications:       Dysphagia, Chest pain (non cardiac) Providers:         Ezzard Standing. Bluford Kaufmann, MD Referring MD:      Teena Irani. Sampson Goon, MD (Referring MD) Medicines:         Monitored Anesthesia Care Complications:     No immediate complications. Procedure:         Pre-Anesthesia Assessment:                    - Prior to the procedure, a History and Physical was                     performed, and patient medications, allergies and                     sensitivities were reviewed. The patient's tolerance of                     previous anesthesia was reviewed.                    - The risks and benefits of the procedure and the sedation                     options and risks were discussed with the patient. All                     questions were answered and informed consent was obtained.                    - After reviewing the risks and benefits, the patient was                     deemed in satisfactory condition to undergo the procedure.                    After obtaining informed consent, the endoscope was passed                     under direct vision. Throughout the procedure, the                     patient's blood pressure, pulse, and oxygen saturations                     were monitored continuously. The Endoscope was introduced                     through the mouth, and advanced to the second part of                     duodenum. The upper GI endoscopy was accomplished without                     difficulty. The patient tolerated the procedure well. Findings:      No endoscopic abnormality was evident in the esophagus to explain the       patient's complaint of dysphagia. It was  decided, however, to proceed  with dilation of the entire esophagus. The scope was withdrawn. Dilation       was performed with a Maloney dilator with mild resistance at 54 Fr. GE       junction biopsies taken.      Localized erythematous mucosa was found in the gastric antrum. Biopsies       were taken with a cold forceps for histology.      The exam was otherwise without abnormality.      The examined duodenum was normal. Impression:        - No endoscopic esophageal abnormality to explain                     patient's dysphagia. Esophagus dilated. Dilated.                    - Erythematous mucosa in the antrum. Biopsied.                    - The examination was otherwise normal.                    - Normal examined duodenum. Recommendation:    - Discharge patient to home.                    - Observe patient's clinical course.                    - Await pathology results.                    - The findings and recommendations were discussed with the                     patient. Procedure Code(s): --- Professional ---                    [phone], Esophagogastroduodenoscopy, flexible, transoral;                     with biopsy, single or multiple                    43450, Dilation of esophagus, by unguided sound or bougie,                     single or multiple passes Diagnosis Code(s): --- Professional ---                    R13.10, Dysphagia, unspecified                    K31.89, Other diseases of stomach and duodenum                    R07.89, Other chest pain CPT copyright 2014 American Medical Association. All rights reserved. The codes documented in this report are preliminary and upon coder review may  be revised to meet current compliance requirements. Wallace Cullens, MD 10/07/2015 8:16:19 AM This report has been signed electronically. Number of Addenda: 0 Note Initiated On: 10/07/2015 7:56 AM      [facility]

## 2015-10-07 NOTE — Anesthesia Postprocedure Evaluation (Signed)
Anesthesia Post Note  Patient: Gina Tate  Procedure(s) Performed: Procedure(s) (LRB): ESOPHAGOGASTRODUODENOSCOPY (EGD) (N/A)  Patient location during evaluation: Endoscopy Anesthesia Type: General Level of consciousness: awake and alert Pain management: pain level controlled Vital Signs Assessment: post-procedure vital signs reviewed and stable Respiratory status: spontaneous breathing, nonlabored ventilation, respiratory function stable and patient connected to nasal cannula oxygen Cardiovascular status: blood pressure returned to baseline and stable Postop Assessment: no signs of nausea or vomiting Anesthetic complications: no    Last Vitals:  Filed Vitals:   10/07/15 0840 10/07/15 0850  BP: 137/69 146/83  Pulse: 80 80  Temp:    Resp: 23 18    Last Pain: There were no vitals filed for this visit.               Lenard Simmer

## 2015-10-07 NOTE — Anesthesia Preprocedure Evaluation (Signed)
Anesthesia Evaluation  Patient identified by MRN, date of birth, ID band Patient awake    Reviewed: Allergy & Precautions, H&P , NPO status , Patient's Chart, lab work & pertinent test results, reviewed documented beta blocker date and time   History of Anesthesia Complications Negative for: history of anesthetic complications  Airway Mallampati: III  TM Distance: >3 FB Neck ROM: full    Dental no notable dental hx. (+) Missing, Poor Dentition   Pulmonary neg pulmonary ROS,    Pulmonary exam normal breath sounds clear to auscultation       Cardiovascular Exercise Tolerance: Good hypertension, On Medications and On Home Beta Blockers (-) angina(-) CAD, (-) Past MI, (-) Cardiac Stents and (-) CABG Normal cardiovascular exam(-) dysrhythmias (-) Valvular Problems/Murmurs Rhythm:regular Rate:Normal     Neuro/Psych PSYCHIATRIC DISORDERS (Depression and dementia) negative neurological ROS     GI/Hepatic Neg liver ROS, hiatal hernia, GERD  Medicated,  Endo/Other  diabetes, Poorly Controlled, Insulin Dependent, Oral Hypoglycemic AgentsHypothyroidism Morbid obesity  Renal/GU negative Renal ROS  negative genitourinary   Musculoskeletal   Abdominal   Peds  Hematology  (+) Blood dyscrasia, anemia ,   Anesthesia Other Findings Past Medical History:   Depression                                                   Thyroid disease                                              Diabetes mellitus, type II (HCC)                             Headache                                                     Hypercholesteremia                                           Hypertension                                                 Hypothyroidism                                               Dementia                                                     GERD (gastroesophageal reflux disease)  History of hiatal hernia                                      Reproductive/Obstetrics negative OB ROS                             Anesthesia Physical Anesthesia Plan  ASA: III  Anesthesia Plan: General   Post-op Pain Management:    Induction:   Airway Management Planned:   Additional Equipment:   Intra-op Plan:   Post-operative Plan:   Informed Consent: I have reviewed the patients History and Physical, chart, labs and discussed the procedure including the risks, benefits and alternatives for the proposed anesthesia with the patient or authorized representative who has indicated his/her understanding and acceptance.   Dental Advisory Given  Plan Discussed with: Anesthesiologist, CRNA and Surgeon  Anesthesia Plan Comments:         Anesthesia Quick Evaluation

## 2015-10-08 ENCOUNTER — Encounter: Payer: Self-pay | Admitting: Gastroenterology

## 2015-10-08 LAB — SURGICAL PATHOLOGY

## 2015-11-04 ENCOUNTER — Encounter: Payer: Self-pay | Admitting: *Deleted

## 2015-11-04 ENCOUNTER — Emergency Department
Admission: EM | Admit: 2015-11-04 | Discharge: 2015-11-04 | Disposition: A | Discharge status: Home / Self Care | Payer: Medicare Other | Attending: Emergency Medicine | Admitting: Emergency Medicine

## 2015-11-04 DIAGNOSIS — E11649 Type 2 diabetes mellitus with hypoglycemia without coma: Secondary | ICD-10-CM | POA: Insufficient documentation

## 2015-11-04 DIAGNOSIS — Z794 Long term (current) use of insulin: Secondary | ICD-10-CM | POA: Diagnosis not present

## 2015-11-04 DIAGNOSIS — Z7982 Long term (current) use of aspirin: Secondary | ICD-10-CM | POA: Insufficient documentation

## 2015-11-04 DIAGNOSIS — I10 Essential (primary) hypertension: Secondary | ICD-10-CM | POA: Diagnosis not present

## 2015-11-04 DIAGNOSIS — E162 Hypoglycemia, unspecified: Secondary | ICD-10-CM

## 2015-11-04 DIAGNOSIS — Z79899 Other long term (current) drug therapy: Secondary | ICD-10-CM | POA: Insufficient documentation

## 2015-11-04 DIAGNOSIS — Z7984 Long term (current) use of oral hypoglycemic drugs: Secondary | ICD-10-CM | POA: Insufficient documentation

## 2015-11-04 LAB — CBC
HCT: 36.8 % (ref 35.0–47.0)
HEMOGLOBIN: 12 g/dL (ref 12.0–16.0)
MCH: 27.2 pg (ref 26.0–34.0)
MCHC: 32.7 g/dL (ref 32.0–36.0)
MCV: 83 fL (ref 80.0–100.0)
Platelets: 275 10*3/uL (ref 150–440)
RBC: 4.44 MIL/uL (ref 3.80–5.20)
RDW: 15.8 % — ABNORMAL HIGH (ref 11.5–14.5)
WBC: 9.3 10*3/uL (ref 3.6–11.0)

## 2015-11-04 LAB — GLUCOSE, CAPILLARY
GLUCOSE-CAPILLARY: 100 mg/dL — AB (ref 65–99)
Glucose-Capillary: 193 mg/dL — ABNORMAL HIGH (ref 65–99)

## 2015-11-04 LAB — COMPREHENSIVE METABOLIC PANEL
ALT: 32 U/L (ref 14–54)
ANION GAP: 9 (ref 5–15)
AST: 40 U/L (ref 15–41)
Albumin: 3.9 g/dL (ref 3.5–5.0)
Alkaline Phosphatase: 92 U/L (ref 38–126)
BUN: 12 mg/dL (ref 6–20)
CHLORIDE: 104 mmol/L (ref 101–111)
CO2: 26 mmol/L (ref 22–32)
Calcium: 8.9 mg/dL (ref 8.9–10.3)
Creatinine, Ser: 0.69 mg/dL (ref 0.44–1.00)
GFR calc non Af Amer: >60 mL/min (ref >60)
Glucose, Bld: 112 mg/dL — ABNORMAL HIGH (ref 65–99)
Potassium: 3.7 mmol/L (ref 3.5–5.1)
SODIUM: 139 mmol/L (ref 135–145)
Total Bilirubin: 0.6 mg/dL (ref 0.3–1.2)
Total Protein: 7.1 g/dL (ref 6.5–8.1)

## 2015-11-04 NOTE — ED Notes (Signed)
States this AM CBG of 47, states she took 30 units of 70/30, states this AM she had to fast for [facility], EMS reports CBG of 64 and came up to 83, pt awake and alert, states her CBg is normally around 140

## 2015-11-04 NOTE — ED Notes (Signed)
CBG 100 

## 2015-11-04 NOTE — Discharge Instructions (Signed)

## 2015-11-04 NOTE — ED Provider Notes (Signed)
Time Seen: Approximately 1518 I have reviewed the triage notes  Chief Complaint: Hypoglycemia   History of Present Illness: Gina Tate is a 65 y.o. female who presents after having some hypoglycemia earlier today. Patient states that she was fasting for some blood work at the clinic and apparently was able to complete the blood work and had her dosage of her normal insulin 70/30. Patient states she ate breakfast and then went to work and started feeling very lightheaded and diaphoretic and symptoms consistent with hypoglycemia. EMS was notified and the patient's blood sugar was in the 40s which is very low for her. The patient states she had some cookies there and then had something to eat patient feels improved now and her blood sugar appears to be increasing   Past Medical History  Diagnosis Date  . Depression   . Thyroid disease   . Diabetes mellitus, type II (HCC)   . Headache   . Hypercholesteremia   . Hypertension   . Hypothyroidism   . Dementia   . GERD (gastroesophageal reflux disease)   . History of hiatal hernia     Patient Active Problem List   Diagnosis Date Noted  . Clinical depression 03/11/2015  . Type 2 diabetes mellitus treated with insulin (HCC) 06/04/2014  . Adult BMI 30+ 06/04/2014  . Difficulty in walking 03/05/2014  . Amnesia 03/05/2014  . Adiposity 03/05/2014  . Disordered sleep 03/05/2014  . Acquired hypothyroidism 12/07/2013  . Absolute anemia 12/07/2013  . Benign essential HTN 12/07/2013  . Arthritis, degenerative 12/07/2013  . Pure hypercholesterolemia 12/07/2013  . Type 2 diabetes mellitus (HCC) 12/07/2013  . Diabetes mellitus (HCC) 12/07/2013    Past Surgical History  Procedure Laterality Date  . Back surgery    . Tonsillectomy    . Hand surgery Right   . Trigger finger release Right   . Tubal ligation    . Esophagogastroduodenoscopy N/A 10/07/2015    Procedure: ESOPHAGOGASTRODUODENOSCOPY (EGD);  Surgeon: Wallace Cullens, MD;  Location:  [facility] ENDOSCOPY;  Service: Gastroenterology;  Laterality: N/A;    Past Surgical History  Procedure Laterality Date  . Back surgery    . Tonsillectomy    . Hand surgery Right   . Trigger finger release Right   . Tubal ligation    . Esophagogastroduodenoscopy N/A 10/07/2015    Procedure: ESOPHAGOGASTRODUODENOSCOPY (EGD);  Surgeon: Wallace Cullens, MD;  Location: [facility] ENDOSCOPY;  Service: Gastroenterology;  Laterality: N/A;    Current Outpatient Rx  Name  Route  Sig  Dispense  Refill  . aspirin EC 81 MG tablet   Oral   Take 81 mg by mouth daily.         Marland Kitchen atenolol (TENORMIN) 25 MG tablet   Oral   Take 25 mg by mouth daily.      0   . atorvastatin (LIPITOR) 10 MG tablet   Oral   Take by mouth.         . Cyanocobalamin (RA VITAMIN B-12 TR) 1000 MCG TBCR   Oral   Take by mouth.         . donepezil (ARICEPT) 10 MG tablet   Oral   Take 1 tablet (10 mg total) by mouth at bedtime.   30 tablet   2     Hold filling until patient calls for refill.   . gabapentin (NEURONTIN) 300 MG capsule      take 1 capsule by mouth at bedtime for 1 week then 2 capsules  for 1 week then 3 capsules at bedtime      0   . insulin NPH-regular Human (NOVOLIN 70/30) (70-30) 100 UNIT/ML injection      Inject 35 units q am and inject 45 units q pm         . lansoprazole (PREVACID) 15 MG capsule   Oral   Take by mouth.         . levothyroxine (SYNTHROID, LEVOTHROID) 137 MCG tablet   Oral   Take by mouth.         . losartan (COZAAR) 50 MG tablet            0   . metFORMIN (GLUCOPHAGE) 1000 MG tablet   Oral   Take by mouth.         . nortriptyline (PAMELOR) 10 MG capsule   Oral   Take 1 capsule (10 mg total) by mouth at bedtime.   30 capsule   2     HOLD FILLING UNTIL PATIENT CALLS FOR REFILL   . venlafaxine (EFFEXOR) 75 MG tablet   Oral   Take 1 tablet (75 mg total) by mouth daily.   30 tablet   2     Hold filling until patient calls for refill.     Allergies:   Review of patient's allergies indicates no known allergies.  Family History: Family History  Problem Relation Age of Onset  . Anemia Mother   . Alzheimer's disease Mother   . Heart attack Father   . Angina Father   . Alzheimer's disease Father   . Depression Father   . Alcohol abuse Father   . Diabetes Sister   . Heart disease Brother   . Bipolar disorder Brother   . Dementia Sister   . Diabetes Brother   . Heart attack Brother   . Heart disease Brother   . Cancer Brother     Social History: Social History  Substance Use Topics  . Smoking status: Never Smoker   . Smokeless tobacco: Never Used  . Alcohol Use: No     Review of Systems:   10 point review of systems was performed and was otherwise negative:  Constitutional: No fever Eyes: No visual disturbances ENT: No sore throat, ear pain Cardiac: No chest pain Respiratory: No shortness of breath, wheezing, or stridor Abdomen: No abdominal pain, no vomiting, No diarrhea Endocrine: No weight loss, No night sweats Extremities: No peripheral edema, cyanosis Skin: No rashes, easy bruising Neurologic: No focal weakness, trouble with speech or swollowing Urologic: No dysuria, Hematuria, or urinary frequency   Physical Exam:  ED Triage Vitals  Enc Vitals Group     BP 11/04/15 1336 116/64 mmHg     Pulse Rate 11/04/15 1336 85     Resp 11/04/15 1336 18     Temp 11/04/15 1336 98.4 F (36.9 C)     Temp Source 11/04/15 1336 Oral     SpO2 11/04/15 1336 99 %     Weight 11/04/15 1336 248 lb (112.492 kg)     Height 11/04/15 1336  (1.676 m)     Head Cir --      Peak Flow --      Pain Score 11/04/15 1337 4     Pain Loc --      Pain Edu? --      Excl. in GC? --     General: Awake , Alert , and Oriented times 3; GCS 15 Head: Normal cephalic , atraumatic Eyes:  Pupils equal , round, reactive to light Nose/Throat: No nasal drainage, patent upper airway without erythema or exudate.  Neck: Supple, Full range of  motion, No anterior adenopathy or palpable thyroid masses Lungs: Clear to ascultation without wheezes , rhonchi, or rales Heart: Regular rate, regular rhythm without murmurs , gallops , or rubs Abdomen: Soft, non tender without rebound, guarding , or rigidity; bowel sounds positive and symmetric in all 4 quadrants. No organomegaly .        Extremities: 2 plus symmetric pulses. No edema, clubbing or cyanosis Neurologic: normal ambulation, Motor symmetric without deficits, sensory intact Skin: warm, dry, no rashes   Labs:   All laboratory work was reviewed including any pertinent negatives or positives listed below:  Labs Reviewed  COMPREHENSIVE METABOLIC PANEL - Abnormal; Notable for the following:    Glucose, Bld 112 (*)    All other components within normal limits  CBC - Abnormal; Notable for the following:    RDW 15.8 (*)    All other components within normal limits  GLUCOSE, CAPILLARY - Abnormal; Notable for the following:    Glucose-Capillary 100 (*)    All other components within normal limits  GLUCOSE, CAPILLARY - Abnormal; Notable for the following:    Glucose-Capillary 193 (*)    All other components within normal limits  CBG MONITORING, ED  CBG MONITORING, ED  CBG MONITORING, ED   patient's blood sugar was monitored here and reached a high of 193   ED Course:  Patient was observed here in emergency department and felt symptomatically improved. Appears that she just got off her normal eating insulin regimen and I felt no further adjustments of her insulin was necessary. Patient has had her blood work done at the clinic and was advised to follow up with them for further outpatient management. She should continue to monitor blood sugar at home and was advised to go home and return if she has any other new concerns.   Assessment:  Hypoglycemia Final Clinical Impression:  Final diagnoses:  Hypoglycemia     Plan:  Outpatient management Patient was advised to return  immediately if condition worsens. Patient was advised to follow up with their primary care physician or other specialized physicians involved in their outpatient care             Jennye MoccasinBrian S Quigley, MD 11/04/15 1610

## 2015-11-25 ENCOUNTER — Encounter: Payer: Medicare Other | Attending: Nurse Practitioner | Admitting: Dietician

## 2015-11-25 VITALS — Ht 66.0 in | Wt 248.3 lb

## 2015-11-25 DIAGNOSIS — Z794 Long term (current) use of insulin: Secondary | ICD-10-CM

## 2015-11-25 DIAGNOSIS — K2100 Gastro-esophageal reflux disease with esophagitis, without bleeding: Secondary | ICD-10-CM

## 2015-11-25 DIAGNOSIS — E119 Type 2 diabetes mellitus without complications: Secondary | ICD-10-CM | POA: Diagnosis present

## 2015-11-25 DIAGNOSIS — K219 Gastro-esophageal reflux disease without esophagitis: Secondary | ICD-10-CM | POA: Diagnosis present

## 2015-11-25 DIAGNOSIS — E669 Obesity, unspecified: Secondary | ICD-10-CM | POA: Insufficient documentation

## 2015-11-25 NOTE — Progress Notes (Signed)
Medical Nutrition Therapy: Visit start time: 1330 end time: 1445 Assessment:  Diagnosis: obesity, type 2 diabetes, GERD Past medical history: hypercholesterolemia, hypertension, hypothyroidism Psychosocial issues/ stress concerns: Patient rates her stress as moderate and indicates "not so well" as to how well she is dealing with her stress. Her PHQ-2 score was 1. Preferred learning method:  . Auditory  Current weight: 248.3 lbs  Height: 66 Medications, supplements: see list Progress and evaluation:  Patient accompanied by her friend in for initial medical nutrition therapy appointment. She states that her main concern is her GERD. She reports that the acid comes up into her throat and into her ears at times. She also has diabetes and she checks her fasting blood sugars and states they are typically in the 140's unless her husband makes or buys her a large dessert the night before. She reports this am, her FBG was 197 after she had eaten a chocolate sundae last night. Her dinner meal can be as late as 8:00 followed by an evening snack before bedtime which is between 9:30-10:00pm.  Five of her dinner meals are "take outs" or eaten "out", often high fat choices such as a cheese dog/fries. She eats "sweets" on a daily basis. She takes her morning 70/30 insulin after breakfast because she doesn't know at work when she will finish her breakfast. She also takes her evening insulin after dinner. She reports that she sometimes has low blood sugars during the night. She adjust the evening insulin she takes based on what she eats. She states that she does not have a good appetite presently. "I have lost my taste for meats". She reports that sometimes she eats a big bowl of sweetened cereal for dinner or just picks up french fries from a [employer] restaurant.   Physical activity: rides on exercise bike 1 time per week for 1/2 mile.   Dietary Intake:  Usual eating pattern includes 3  meals and 3 snacks per  day. Dining out frequency: 7 meals per week.  Breakfast: 8:00am egg sandwich or 2 boiled eggs or sausage or bacon biscuit/hash browns or cereal/whole milk Snack: 10:00am poptart or grapes, decaf. coffee Lunch: 12:30pm- pimento cheese or chicken or tuna salad sandwich, or indiv. Chicken pie, pudding(reg.) or yogurt Snack: banana Supper: 4-8:00pm- cheesedog/fries or manwhich or sandwich Snack: cheese/crackers or ice cream or cookies Beverages: decaf. coffee, water  Nutrition Care Education: Gastro-esophageal reflux: Gave and instructed on dietary guidelines for GERD. Stressed importance of small, low fat meals. Diabetes:  Instructed on a meal plan for diabetes based on 1500 calories including how to balance meals with protein, carbohydrate and non-starchy vegetables. Used food models and food guide plate as visuals. Discussed how dietary guidelines for diabetes can incorporate basic dietary principles for GERD. Showed menu examples based on foods available and preferences. Discussed importance of consistent meal times as well as more consistent balance of carbohydrate and protein  when taking insulin to help decrease low blood sugars. Reviewed treatment of low blood sugars. Suggested to take her evening insulin before her dinner explaining that 30% of dosage is to help cover her meal. Suggested to do that at breakfast as well, but she stated she often cannot finish her breakfast and is concerned about having a low blood sugar.  Nutritional Diagnosis:  Winston-3.3 Overweight/obesity As related to intake of high fat meals, frequent dining out, lack of physical activity.  As evidenced by diet and exercise history and BMI of 40.2.  Intervention:  Refer to dietary guidelines  for GERD and avoid foods listed. Balance meals with 1-3 oz. protein, 2-3 servings of carbohydrate and non-starchy vegetables.  Avoid fried foods. Limit added fats such as mayonnaise, margarine, sour cream, etc. Bedtime snack: Include  1 oz. Protein and 1 serving of starch. Add small amounts of canned fruit with sandwich. Try to eat dinner earlier, by 6:00pm if possible.  Patient requested information re: Advanced Directives (Living will and health care power of attorney) [facility] [phone] [facility]  Education Materials given:  . General diet guidelines GERD . Food lists/ Planning A Balanced Meal . Sample meal pattern/ menus . Goals/ instructions  Learner/ who was taught:  . Patient . Patient's friend, Darel Hong  Level of understanding: . Partial understanding; needs review/ practice Learning barriers: . None  Willingness to learn/ readiness for change: . Hesitance, contemplating change  Monitoring and Evaluation:  Dietary intake, exercise, and body weight      follow up:12/23/15 at 1:15pm

## 2015-11-25 NOTE — Patient Instructions (Signed)
Refer to dietary guidelines for GERD and avoid foods listed. Balance meals with 1-3 oz. protein, 2-3 servings of carbohydrate and non-starchy vegetables.  Avoid fried foods. Limit added fats such as mayonnaise, margarine, sour cream, etc. Bedtime snack: Include 1 oz. Protein and 1 serving of starch. Add small amounts of canned fruit with sandwich. Try to eat dinner earlier, by 6:00pm if possible.  Re: Advanced Directives (Living will and health care power of attorney [facility] [phone] Vickey HugerGail Scott

## 2015-12-23 ENCOUNTER — Encounter: Payer: Medicare Other | Attending: Nurse Practitioner | Admitting: Dietician

## 2015-12-23 VITALS — Ht 66.0 in | Wt 238.8 lb

## 2015-12-23 DIAGNOSIS — Z794 Long term (current) use of insulin: Secondary | ICD-10-CM

## 2015-12-23 DIAGNOSIS — K219 Gastro-esophageal reflux disease without esophagitis: Secondary | ICD-10-CM | POA: Diagnosis present

## 2015-12-23 DIAGNOSIS — K2100 Gastro-esophageal reflux disease with esophagitis, without bleeding: Secondary | ICD-10-CM

## 2015-12-23 DIAGNOSIS — E669 Obesity, unspecified: Secondary | ICD-10-CM | POA: Insufficient documentation

## 2015-12-23 DIAGNOSIS — E119 Type 2 diabetes mellitus without complications: Secondary | ICD-10-CM | POA: Insufficient documentation

## 2015-12-23 NOTE — Progress Notes (Signed)
Medical Nutrition Therapy Follow-up visit:  Time with patient: 1320-1410 Visit #:2 ASSESSMENT:  Diagnosis: obesity, type 2 diabetes, GERD  Current weight:238.8 lbs    Height: 66 in Medications: See list Medical History: hypercholesterolemia, hypertension, hypothyroidism Progress and evaluation: Patient in for medical nutrition therapy follow-up visit. She reports that her fasting blood sugars have improved and most are 110's or less. She also reports that GERD symptoms are less frequent and less severe. She has followed through to make positive changes in her diet and exercise. She is eating dinner by 6:00pm and preparing more meals at home. She has eliminated most "fast foods" and most fried foods. She is eating fruit at least once daily and is eating steamed vegetables more often. She states she is not adding any type of fat to her baked potato and states, "You can really taste the potato". Her diet may be low in protein sources on some days and sources of calcium are limited. She has lost 9.5 lbs in the past month. On some days, she is possibly being overly restrictive which if continued, may decrease chance of long-term success. Physical activity: rides exercise bike 5-6 days/week for 1/2 mile. She states if she does more, she aggravates her knee.  NUTRITION CARE EDUCATION: Diabetes/weight control: Commended patient on her positive diet and exercise changes. Discussed the improvement in her blood sugars related to these changes and subsequent weight loss. Obtained present meal pattern and examples of her choices. Discussed food group servings needed to meet nutrient needs and foods that could be added to help meet those needs and to avoid being overly restrictive.  Reviewed balance of carbohydrate, protein, non-starchy vegetables and fat. Discussed again why the exercise she is doing is so important to make her cells more sensitive to insulin, helping to control blood sugar.   INTERVENTION:   Continue with previous goals. Include 5-6 oz of protein foods per day to meet protein needs. Strive for goal of 5 servings of fruits and vegetables per day. Use snacks to eat some of the foods that otherwise might be low in the diet, especially fruit/vegetables and milk products (yogurt). Continue with exercise bike- 5-6 days/week for 1/2 mile.  EDUCATION MATERIALS GIVEN:  . Goals/ instructions LEARNER/ who was taught:  . Patient  LEVEL OF UNDERSTANDING: . Partial understanding; needs review/ practice LEARNING BARRIERS: . None WILLINGNESS TO LEARN/READINESS FOR CHANGE: . Eager, change in progress  MONITORING AND EVALUATION:  02/03/16 at 1:15pm

## 2015-12-23 NOTE — Patient Instructions (Signed)
Continue with previous goals. Include 5-6 oz of protein foods per day to meet protein needs. Strive for goal of 5 servings of fruits and vegetables per day. Use snacks to eat some of the foods that otherwise might be low in the diet, especially fruit/vegetables and milk products (yogurt). Continue with exercise bike- 5-6 days/week for 1/2 mile.

## 2015-12-27 ENCOUNTER — Encounter: Payer: Self-pay | Admitting: Dietician

## 2016-02-03 ENCOUNTER — Encounter: Payer: Self-pay | Admitting: Dietician

## 2016-02-03 ENCOUNTER — Encounter: Payer: Medicare Other | Attending: Nurse Practitioner | Admitting: Dietician

## 2016-02-03 VITALS — Ht 66.0 in | Wt 241.0 lb

## 2016-02-03 DIAGNOSIS — K2100 Gastro-esophageal reflux disease with esophagitis, without bleeding: Secondary | ICD-10-CM

## 2016-02-03 DIAGNOSIS — K219 Gastro-esophageal reflux disease without esophagitis: Secondary | ICD-10-CM | POA: Diagnosis present

## 2016-02-03 DIAGNOSIS — E669 Obesity, unspecified: Secondary | ICD-10-CM | POA: Diagnosis not present

## 2016-02-03 DIAGNOSIS — E119 Type 2 diabetes mellitus without complications: Secondary | ICD-10-CM | POA: Diagnosis present

## 2016-02-03 DIAGNOSIS — Z794 Long term (current) use of insulin: Secondary | ICD-10-CM

## 2016-02-03 NOTE — Patient Instructions (Signed)
Switch to unsweetened cereal such as shredded wheat, cheerios, rice chex or some of the [place] cereals.  Try to limit cereal to 1 cup. Add 3 sets of peanut crackers or 1 oz cheese with crackers or toast. Or eat 1 egg with toast, 1/2 cup grits.  Lunch: Ex. Tuna sandwich, fruit If leftovers, balance with meat or other protein, 2 starches and non-starchy vegetables. Can add a small serving of fruit to any meal. Count 1 cup of anything starchy as 2 servings of starch.  Dinner: If you don't want to eat the meat that's prepared, include protein such as peanut butter, beans or cheese.  Be more consistent with riding exercise bike.

## 2016-02-03 NOTE — Progress Notes (Signed)
Medical Nutrition Therapy: Visit start time: 1315  end time: 1400 Assessment:  Diagnosis: obesity, type 2 diabetes, GERD  Current weight: 241 lbs  Height: 66 in Medications, supplements: see list Progress and evaluation:  Patient in for medical nutrition therapy follow-up. She reports that in the 6 weeks since her previous visit she has attended several family celebrations and this has made it difficult for her to "stay on track" with positive diet changes. She reports that she has increased her intake of sweets and fast foods. She reports her fasting blood sugars have increased from 110's to 160-180 and GERD symptoms have increased. She does continue to eat her dinner meal by 6:00pm and is preparing some meals at home.She reports she tries to eat fruit at least once daily and continues to add steamed non-starchy vegetables to some of her dinner meals. She is not doing any structured exercise and states she can resume riding her exercise bike once she has completed knee injections.  Weight gain of 2.2 lbs in past 6 weeks and patient stated she was not surprised about weight gain. She reports she is still motivated to lose weight and would like to lose at least 9-10 lbs to fit into the dress she will wear to grandson's wedding in July.  Nutrition Care Education: Diabetes/Weight control: Reviewed strategies to continue efforts with healthier eating such as using salad size plate and adding large portion of non-starchy vegetables to meat/starch meals. Discussed snacks options. Encouraged to split exercise when able to resume to 2 (10-15 minute) segments. Reviewed food guide plate method again in helping to balance meals.  Intervention:  Switch to unsweetened cereal such as shredded wheat, cheerios, rice chex or some of the [place] cereals.  Try to limit cereal to 1 cup. Add 3 sets of peanut crackers or 1 oz cheese with crackers or toast. Or eat 1 egg with toast, 1/2 cup grits.  Lunch: Ex. Tuna  sandwich, fruit If leftovers, balance with meat or other protein, 2 starches and non-starchy vegetables. Can add a small serving of fruit to any meal. Count 1 cup of anything starchy as 2 servings of starch.  Dinner: If you don't want to eat the meat that's prepared, include protein such as peanut butter, beans or cheese.  Be more consistent with riding exercise bike.   Education Materials given:  Marland Kitchen. Goals/ instructions Learner/ who was taught:  . Patient   Level of understanding: . Partial understanding; needs review/ practice Learning barriers: . None Willingness to learn/ readiness for change: . Eager, change in progress  Monitoring and Evaluation:  Dietary intake, exercise,  and body weight      follow up: 03/02/2016 at 1:00pm

## 2016-03-02 ENCOUNTER — Encounter: Payer: Medicare Other | Attending: Nurse Practitioner | Admitting: Dietician

## 2016-03-02 ENCOUNTER — Encounter: Payer: Self-pay | Admitting: Dietician

## 2016-03-02 VITALS — Ht 66.0 in | Wt 245.5 lb

## 2016-03-02 DIAGNOSIS — K219 Gastro-esophageal reflux disease without esophagitis: Secondary | ICD-10-CM | POA: Diagnosis present

## 2016-03-02 DIAGNOSIS — K2100 Gastro-esophageal reflux disease with esophagitis, without bleeding: Secondary | ICD-10-CM

## 2016-03-02 DIAGNOSIS — E119 Type 2 diabetes mellitus without complications: Secondary | ICD-10-CM | POA: Diagnosis present

## 2016-03-02 DIAGNOSIS — E669 Obesity, unspecified: Secondary | ICD-10-CM | POA: Diagnosis not present

## 2016-03-02 DIAGNOSIS — Z794 Long term (current) use of insulin: Secondary | ICD-10-CM

## 2016-03-02 NOTE — Patient Instructions (Signed)
Switch to unsweetened cereal such as shredded wheat, cheerios, rice chex or some of the [place] cereals.  Try to limit cereal to 1 cup. Add 3 sets of peanut crackers or 1 oz cheese with crackers or toast. Or eat 1 egg with toast, 1/2 cup grits.  Lunch: Ex. Tuna sandwich, fruit If leftovers, balance with meat or other protein, 2 starches and non-starchy vegetables. Can add a small serving of fruit to any meal. Count 1 cup of anything starchy as 2 servings of starch.  Dinner: If you don't want to eat the meat that's prepared, include protein such as peanut butter, beans or cheese.  Be more consistent with riding exercise bike.  Avoid buffets.

## 2016-03-02 NOTE — Progress Notes (Signed)
Medical Nutrition Therapy Follow-up visit:  Time with patient: 1330-1400 Visit #:4 ASSESSMENT:  Diagnosis:obesity, type 2 diabetes, GERD  Current weight:245.5 lbs    Height:66in Medications: See list Progress and evaluation: Patient in for medical nutrition therapy follow-up. She reports she has had increased stress related to IRS accusations that are untrue and now some of her husband and her wages are being garnished. She discussed all the steps she has taken to prove that someone else used her SS number. She states, when I'm stressed, I eat more. She gave an example of a breakfast she had to celebrate her birthday that consisted of chipped beef gravy, 2 pieces toast, 3 pancakes, grits, 6 sausage links, 2-3 glasses of chocolate milk, cantaloupe and hash browns. Her husband made her a banana pudding cheesecake and later that day she ate the entire pie. She has been to several buffet restaurants since her previous visit. She states that she does not have a readiness to make positive changes in her diet presently but that it has helped her to come for appointments so that she does not ultimately give up on her health. She has completed series of knee injections and she feels she will be able to resume using her exercise bike soon. Her weight today is 4.5 lbs greater than 1 month ago.   NUTRITION CARE EDUCATION: Weight control/ Diabetes: Acknowledged how eating can be a way of coping with stress and encouraged at this time to keep a consistent pattern of meals and 1-2 small snacks in order to be mindful of intake. Encouraged to avoid buffets which lend to over eating. Reviewed basic concept of food guide plate to help balance meals but not overwhelm. Encouraged to use fruits and non-starchy vegetables to help limit higher calorie entrees that husband prepares. Reviewed previous goals set and only added to avoid buffets.   INTERVENTION:  Previous goals: Switch to unsweetened cereal such as  shredded wheat, cheerios, rice chex or some of the [place] cereals.  Try to limit cereal to 1 cup. Add 3 sets of peanut crackers or 1 oz cheese with crackers or toast. Or eat 1 egg with toast, 1/2 cup grits.  Lunch: Ex. Tuna sandwich, fruit If leftovers, balance with meat or other protein, 2 starches and non-starchy vegetables. Can add a small serving of fruit to any meal. Count 1 cup of anything starchy as 2 servings of starch.  Dinner: If you don't want to eat the meat that's prepared, include protein such as peanut butter, beans or cheese.  Be more consistent with riding exercise bike. New goal: Avoid buffets.   EDUCATION MATERIALS GIVEN:  . Plate Planner . "Pick One" basic meal plan guide  LEARNER/ who was taught:  . Patient   LEVEL OF UNDERSTANDING: . Partial understanding; needs review/ practice LEARNING BARRIERS: . None WILLINGNESS TO LEARN/READINESS FOR CHANGE: . Hesitance, contemplating change  MONITORING AND EVALUATION:  05/18/16 at 1315

## 2016-05-18 ENCOUNTER — Encounter: Payer: Medicare Other | Attending: Nurse Practitioner | Admitting: Dietician

## 2016-05-18 ENCOUNTER — Encounter: Payer: Self-pay | Admitting: Dietician

## 2016-05-18 VITALS — Ht 66.0 in | Wt 249.0 lb

## 2016-05-18 DIAGNOSIS — K219 Gastro-esophageal reflux disease without esophagitis: Secondary | ICD-10-CM | POA: Insufficient documentation

## 2016-05-18 DIAGNOSIS — E119 Type 2 diabetes mellitus without complications: Secondary | ICD-10-CM | POA: Diagnosis present

## 2016-05-18 DIAGNOSIS — E669 Obesity, unspecified: Secondary | ICD-10-CM | POA: Insufficient documentation

## 2016-05-18 DIAGNOSIS — K2100 Gastro-esophageal reflux disease with esophagitis, without bleeding: Secondary | ICD-10-CM

## 2016-05-18 DIAGNOSIS — Z794 Long term (current) use of insulin: Secondary | ICD-10-CM

## 2016-05-18 NOTE — Patient Instructions (Signed)
Previous goals: Switch to unsweetened cereal such as shredded wheat, cheerios, rice chex or some of the [place] cereals.  Try to limit cereal to 1 cup. Add 3 sets of peanut crackers or 1 oz cheese with crackers or toast. Or eat 1 egg with toast, 1/2 cup grits. Add a small serving of fruit.  Lunch: Ex. Tuna sandwich, fruit If leftovers, balance with meat or other protein, 2 starches and non-starchy vegetables. Can add a small serving of fruit to any meal. Count 1 cup of anything starchy as 2 servings of starch.  Dinner: If you don't want to eat the meat that's prepared, include protein such as peanut butter, beans or cheese.  Be more consistent with riding exercise bike. (Wait on this until MD evaluates knee pain). New goal: Avoid buffets. Stop preparing desserts. No fried foods Include a fruit or vegetable or both at meals.

## 2016-05-18 NOTE — Progress Notes (Signed)
Medical Nutrition Therapy Follow-up visit:  Time with patient: 1320-1430 Visit #:5 ASSESSMENT:  Diagnosis:obesity, Type 2 diabetes, GERD  Current weight:249 lbs    Height:66 in Medications: See list Progress and evaluation: Patient in for medical nutrition therapy follow-up. She states that she has not followed through with previous goals she had established. She continues to experience stress related to IRS issues, work issues and her daughter's health.  When asked about working on positive diet changes, she stated "I don't see it happening right now".  I suggested that she meet with one of [facility]'s counselors since there is no charge for a one time visit but she said she doesn't want to at this time. Discussed again how being overwhelmed with her circumstances can be a barrier to making any diet changes.  She reports she is eating more fast food at breakfast such as ham or sausage and egg biscuits and hash browns. Her lunch and dinner meals yesterday were hamburger/fries and lemonade and pizza for dinner. She states," I can't understand why I am gaining weight because I don't eat that much." Her present diet is low in fruits, vegetables, fiber, whole grains and calcium sources. Her main beverage continues to be water and she drinks at least 3 (16oz) bottles per day. She checks her blood sugar most mornings and said her 14 day average (from her meter) was 180. Her last HgA1c was March '17 and she reports she does not have another one scheduled.  She has gained 3.5 lbs since previous visit 2 1/2 months agol  Physical activity:none presently. She rates the pain in her knees as a "10" and has an MD appointment scheduled Wed. (05/20/16) regarding this.     NUTRITION CARE EDUCATION:  Weight controlDiabetes:  Patient admits to having no readiness to make positive diet changes but states she wants to continue to schedule appointments so she does not totally give up on her health. Stressed again  how counseling could help give her guidance on working through the emotional barriers that prevent change but she states she doesn't want to meet with a counselor. Reviewed the simple meal plan guide given at previous visit and reviewed previous goals that addressed simple changes at her meals. Stressed importance of keeping established meal and snack times to avoid eating at other times and to then try again the plate method of protein, starch and non-starchy vegetables, adding fruit to any meal. At end of session, she stated that she would like to see if her husband would come to the next session and I encouraged her to do so.  Intervention: Previous goals: Switch to unsweetened cereal such as shredded wheat, cheerios, rice chex or some of the [place] cereals.  Try to limit cereal to 1 cup. Add 3 sets of peanut crackers or 1 oz cheese with crackers or toast. Or eat 1 egg with toast, 1/2 cup grits. Add a small serving of fruit.  Lunch: Ex. Tuna sandwich, fruit If leftovers, balance with meat or other protein, 2 starches and non-starchy vegetables. Can add a small serving of fruit to any meal. Count 1 cup of anything starchy as 2 servings of starch.  Dinner: If you don't want to eat the meat that's prepared, include protein such as peanut butter, beans or cheese.  Be more consistent with riding exercise bike. (Wait on this until MD evaluates knee pain). Avoid buffets. In addition: Stop preparing desserts. No fried foods Include a fruit or vegetable or both at meals. Ask husband  to attend next session. EDUCATION MATERIALS GIVEN:  . Goals/ instructions LEARNER/ who was taught:  . Patient   LEVEL OF UNDERSTANDING: Verbalizes understanding but has not been able to follow through with change.  LEARNING BARRIERS: . None  WILLINGNESS TO LEARN/READINESS FOR CHANGE: . Non-acceptance, not ready for change  MONITORING AND EVALUATION: 06/15/16 at 1:15pm

## 2016-06-15 ENCOUNTER — Ambulatory Visit: Payer: No Typology Code available for payment source | Admitting: Dietician

## 2016-07-27 ENCOUNTER — Encounter: Payer: Medicare Other | Attending: Nurse Practitioner | Admitting: Dietician

## 2016-07-27 ENCOUNTER — Encounter: Payer: Self-pay | Admitting: Dietician

## 2016-07-27 VITALS — Ht 66.0 in | Wt 250.0 lb

## 2016-07-27 DIAGNOSIS — K219 Gastro-esophageal reflux disease without esophagitis: Secondary | ICD-10-CM | POA: Diagnosis present

## 2016-07-27 DIAGNOSIS — K2100 Gastro-esophageal reflux disease with esophagitis, without bleeding: Secondary | ICD-10-CM

## 2016-07-27 DIAGNOSIS — E119 Type 2 diabetes mellitus without complications: Secondary | ICD-10-CM | POA: Diagnosis present

## 2016-07-27 DIAGNOSIS — Z794 Long term (current) use of insulin: Secondary | ICD-10-CM

## 2016-07-27 DIAGNOSIS — E669 Obesity, unspecified: Secondary | ICD-10-CM | POA: Insufficient documentation

## 2016-07-27 NOTE — Patient Instructions (Signed)
Avoid fried foods (french fries, fried fish, etc). Include a meat or protein food such as egg or dried beans (pintos, black-eyed peas, etc) with each meal. Limit starches to 2 servings per meal. Balance meals with protein, 1-2 starch servings, vegetable or fruit or both. Use "Simple Meal Plan Guide" to help put together meal ideas. Consider arm chair exercises during the commercials when watching TV.

## 2016-07-27 NOTE — Progress Notes (Signed)
Medical Nutrition Therapy Follow-up visit:  Time with patient: 1315-1410 Visit #:6 ASSESSMENT:  Diagnosis:obesity, type 2 diabetes, GERD Current weight:250 lbs    Height:66 in Medications: See list  Progress and evaluation: Patient in for medical nutrition therapy follow-up. She reports that her stress has lessened in the past 2 months and she feels ready to make positive diet changes. She reports that she had to take Prednisone since her previous visit for an infected saliva gland which increased her appetite during that time. Presently, she eats an egg sandwich for breakfast and takes a sandwich and cup of fruit for lunch. She does very little cooking and reports her husband picks up dinner several nights per week and gave examples of cheese dog/fries and fried shrimp, potato and slaw. She eats a lunch or dinner meal "out" 2 times per week and at least one of these meals is a buffet meal. She reports that she had lab work done last week and her HgA1c had increased to 8.3 and she noticed that her liver enzymes have continued to increase and now exceed the recommended range. She also reports her Hg had decreased and was 11. Her weight has remained relatively stable in the past 10 weeks with a gain of 1 lb. Her present diet continues to be low in fruits, vegetables, fiber, calcium sources.  Physical activity:none   NUTRITION CARE EDUCATION:   Weight control/Diabetes: Discussed how her rate of weight gain has slowed in the past 10 weeks which possibly correlates with decreased stress level. Reviewed meal planning using "Simple Meal Plan Guide", discussing examples of meals that would better balance carbohydrate, protein and fat. Also, used food guide plate and food models as visuals. Discussed meal options when dining "out" and encouraged again to avoid buffets. Stressed again how balancing low calorie vegetables and fruit with portions of protein and starch will help increase nutrients and  fiber and can aid with weight loss. Commended her on looking at pattern of her lab results such as elevated liver enzymes and HgA1c and discussed how meal plan instructed on could help improve these results. INTERVENTION:  Avoid fried foods (french fries, fried fish, etc). Include a meat or protein food such as egg or dried beans (pintos, black-eyed peas, etc) with each meal. Limit starches to 2 servings per meal. Balance meals with protein, 1-2 starch servings, vegetable or fruit or both. Use "Simple Meal Plan Guide" to help put together meal ideas. Consider arm chair exercises during the commercials when watching TV.  EDUCATION MATERIALS GIVEN:  . Plate Planner . Simple Meal Plan Guide . Goals/ instructions LEARNER/ who was taught:  . Patient  LEVEL OF UNDERSTANDING: . Partial understanding; needs review/ practice LEARNING BARRIERS: . None WILLINGNESS TO LEARN/READINESS FOR CHANGE: . Acceptance, ready for change  MONITORING AND EVALUATION:  Weight, diet, exercise   Follow-up- 09-07-16 at 1:15pm

## 2016-08-31 DIAGNOSIS — I82409 Acute embolism and thrombosis of unspecified deep veins of unspecified lower extremity: Secondary | ICD-10-CM

## 2016-08-31 HISTORY — DX: Acute embolism and thrombosis of unspecified deep veins of unspecified lower extremity: I82.409

## 2016-09-07 ENCOUNTER — Encounter: Payer: Self-pay | Admitting: Dietician

## 2016-09-07 ENCOUNTER — Encounter: Payer: Medicare Other | Attending: Nurse Practitioner | Admitting: Dietician

## 2016-09-07 VITALS — Ht 66.0 in | Wt 250.9 lb

## 2016-09-07 DIAGNOSIS — IMO0001 Reserved for inherently not codable concepts without codable children: Secondary | ICD-10-CM

## 2016-09-07 DIAGNOSIS — E119 Type 2 diabetes mellitus without complications: Secondary | ICD-10-CM | POA: Diagnosis present

## 2016-09-07 DIAGNOSIS — Z794 Long term (current) use of insulin: Secondary | ICD-10-CM

## 2016-09-07 DIAGNOSIS — E669 Obesity, unspecified: Secondary | ICD-10-CM | POA: Insufficient documentation

## 2016-09-07 DIAGNOSIS — K219 Gastro-esophageal reflux disease without esophagitis: Secondary | ICD-10-CM | POA: Diagnosis present

## 2016-09-07 NOTE — Patient Instructions (Signed)
  Continue with previous goals set: Avoid fried foods (french fries, fried fish, etc). Include a meat or protein food such as egg or dried beans (pintos, black-eyed peas, etc) with each meal. Limit starches to 2 servings per meal. Balance meals with protein, 1-2 starch servings, vegetable or fruit or both. Use "Simple Meal Plan Guide" to help put together meal ideas. Exercise bike- 3 days per week

## 2016-09-07 NOTE — Progress Notes (Signed)
Medical Nutrition Therapy Follow-up visit:  Time with patient: 1315-1415 Visit #:7 ASSESSMENT:  Diagnosis:obesity, type 2 diabetes, GERD  Current weight:250.9 lbs lbs    Height:66 Medications: See list Progress and evaluation: Patient in for medical nutrition therapy follow-up. Weight has remained relatively stable in past 6 weeks. Patient reports she had 2 weeks off from work over the holidays and was at home more making it more difficult to control food intake. She continues to eat 3 meals per day. She states that she typically cooks an evening meal for her husband but she has been eating soup for dinner frequently. She reports that she has tried to eat healthier but her  her husband continues to pick up foods such as doughnuts and bring home. She states this seems to increase whenever she works on a [employer] diet. Patient takes iron and states she has had low iron levels most of her life. Her present diet is possibly low in protein and iron sources in addition to fruits/vegetables and calcium sources.  Physical activity:very little; has used stationary bike once in past 2-3 weeks.  NUTRITION CARE EDUCATION: Weight control/Diabetes: Discussed how her efforts to eat healthier has helped to keep her weight stable over the last month during the holiday season. Used food models, "Simple Meal Plan Guide" and Planning a balanced meals to review how to better balance meals to help ensure adequate protein and help with  portion control of carbohydrate foods. Made suggestions for low calorie sweet options such as congealed salads and cheesecake made with sugar free jello. Gave and reviewed protein sources in addition to meat. Also gave and reviewed iron rich foods, many of the same foods as on the protein list. Stressed importance of using exercise bike more frequently both for weight control and glucose control. Encouraged to discuss again with her husband importance of limiting sweets and  high fat snacks into the home.  INTERVENTION:  Continue with previous goals set: Avoid fried foods (french fries, fried fish, etc). Include a meat or protein food such as egg or dried beans (pintos, black-eyed peas, etc) with each meal. Limit starches to 2 servings per meal. Balance meals with protein, 1-2 starch servings, vegetable or fruit or both. Use "Simple Meal Plan Guide" to help put together meal ideas. Exercise bike- 3 days per week   EDUCATION MATERIALS GIVEN:  . Plate Planner . Food lists/ Planning A Balanced Meal . Protein Finder . List of iron rich foods  . Sample meal pattern/ menus . Goals/ instructions  LEARNER/ who was taught:  . Patient   LEVEL OF UNDERSTANDING: . Partial understanding; needs review/ practice LEARNING BARRIERS: . None WILLINGNESS TO LEARN/READINESS FOR CHANGE: . Acceptance, ready for change  MONITORING AND EVALUATION:  10/12/16 at 1:15pm

## 2016-10-12 ENCOUNTER — Encounter: Payer: Self-pay | Admitting: Dietician

## 2016-10-12 ENCOUNTER — Encounter: Payer: Medicare Other | Attending: Nurse Practitioner | Admitting: Dietician

## 2016-10-12 VITALS — Ht 66.0 in | Wt 251.5 lb

## 2016-10-12 DIAGNOSIS — K219 Gastro-esophageal reflux disease without esophagitis: Secondary | ICD-10-CM | POA: Diagnosis present

## 2016-10-12 DIAGNOSIS — K2100 Gastro-esophageal reflux disease with esophagitis, without bleeding: Secondary | ICD-10-CM

## 2016-10-12 DIAGNOSIS — IMO0001 Reserved for inherently not codable concepts without codable children: Secondary | ICD-10-CM

## 2016-10-12 DIAGNOSIS — E119 Type 2 diabetes mellitus without complications: Secondary | ICD-10-CM | POA: Diagnosis present

## 2016-10-12 DIAGNOSIS — E669 Obesity, unspecified: Secondary | ICD-10-CM | POA: Insufficient documentation

## 2016-10-12 DIAGNOSIS — Z794 Long term (current) use of insulin: Secondary | ICD-10-CM

## 2016-10-12 NOTE — Patient Instructions (Signed)
Meet with counselor. Exercise bike- Start with 1/2 mile every day. Remember to include a protein food with each meal. Include 3 food group servings at dinner meal , preferably meat or other protein, starch, vegetable or fruit or both. Keep raw vegetables and fruit to munch on.

## 2016-10-12 NOTE — Progress Notes (Signed)
Medical Nutrition Therapy: Visit start time: 13:20   end time: 1400 Assessment:  Diagnosis: obesity, Type 2 diabetes, GERD Medical history changes: none reported Psychosocial issues/ stress concerns: Patient reports that she would like to meet with counselor. She states, "I think I'm a little depressed". Her sister passed away 09/10/15 and has experienced a hurtful situation with her niece related to this. I have [facility] and this is the first she wants to do so and she initiated request.  Current weight: 251.5 lbs  Height: 66 in Medications, supplement changes: see list Progress and evaluation:  Patient in for medical nutrition therapy follow-up. She states she has tried to control portions better but has a tendency to eat too much especially for her evening meal. She admits that stress has contributed to eating more in the evenings. She has been trying to include more protein sources.  She reports that her recent blood sugars have improved with a fasting this am of 108.  Her weight today is .6 lbs greater than weight 1 month ago. Physical activity: 0-1 day per week-exercise bike for 1/2 mile. She has a healing fracture in her right foot which limits any walking.  Nutrition Care Education: Diabetes: Commended on improved blood glucose control. Discussed how even small diet changes could make a difference. As in previous visits, reviewed balancing meals using food guide plate as a guide. Stressed importance of increasing  exrcise using exercise bike which she states does not put pressure on her foot.   Nutritional Diagnosis:  [place]-3.3 Overweight/obesity As related to history of high fat meals, frequent "take out " foods and lack of physical activity..  As evidenced by diet and exercise history..  Intervention:  Meet with counselor. Exercise bike- Start with 1/2 mile every day. Remember to include a protein food with each meal. Include 3 food group servings  at dinner meal , preferably meat or other protein, starch, vegetable or fruit or both. Keep raw vegetables and fruit to munch on.  Education Materials given:  Marland Kitchen. Goals/ instructions  Learner/ who was taught:  . Patient  Level of understanding: . Partial understanding; needs review/ practice Demonstrated degree of understanding via:   Teach back Learning barriers: . None  Willingness to learn/ readiness for change: . Acceptance, ready for change  Monitoring and Evaluation:  Dietary intake, exercise, , and body weight      follow up: 11/23/16 at 1:30pm

## 2016-11-23 ENCOUNTER — Encounter: Payer: Self-pay | Admitting: Dietician

## 2016-11-23 ENCOUNTER — Encounter: Payer: Medicare Other | Attending: Nurse Practitioner | Admitting: Dietician

## 2016-11-23 VITALS — Ht 66.0 in | Wt 247.7 lb

## 2016-11-23 DIAGNOSIS — K2100 Gastro-esophageal reflux disease with esophagitis, without bleeding: Secondary | ICD-10-CM

## 2016-11-23 DIAGNOSIS — E669 Obesity, unspecified: Secondary | ICD-10-CM | POA: Diagnosis present

## 2016-11-23 DIAGNOSIS — K219 Gastro-esophageal reflux disease without esophagitis: Secondary | ICD-10-CM | POA: Insufficient documentation

## 2016-11-23 DIAGNOSIS — E119 Type 2 diabetes mellitus without complications: Secondary | ICD-10-CM

## 2016-11-23 DIAGNOSIS — Z794 Long term (current) use of insulin: Secondary | ICD-10-CM

## 2016-11-23 DIAGNOSIS — IMO0001 Reserved for inherently not codable concepts without codable children: Secondary | ICD-10-CM

## 2016-11-23 NOTE — Progress Notes (Signed)
Medical Nutrition Therapy Follow-up visit:  Time with patient: 1330-1430 Visit #:9 ASSESSMENT:  Diagnosis:obesity, type 2 diabetes, GERD  Current weight: 247.7 lbs    Height: 66in Medications: See list Medical History : Reports her last HgA1c was 8.2 so MD increased her insulin. Progress and evaluation: Patient in for medical nutrition therapy follow-up. She states that she hit "rock bottom" and found it helpful to meet with [facility] counselor. She states she is much better and is more motivated regarding her health. She also confronted a co-worker which was a source of stress and states that situation is much better. She has a consistent pattern of 3 meals and 2 snacks and is working to control portions. She would not go with her husband and daughter when they chose to go to a buffet because she knew she would over eat. She states that she is experiencing more constipation now that she is eating less.  She has lost 3.8 lbs in the past 6 weeks since her previous visit. She has increased her exercise to 1/2 mile on stationary bike, 5-6 days per week.   NUTRITION CARE EDUCATION:     Weight control/Diabetes:  Reviewed balance at meals of protein, carbohydrate and non-starchy vegetables. Gave and reviewed list of foods and fiber content and discussed practical ways to increase in the diet encouraging to keep a good fluid intake in the process. Commended on increase in exercise.   NUTRITIONAL DIAGNOSIS:  NI-5.11.1 Predicted suboptimal nutrient intake As related to low intake of fruits, vegetables and fiber.  As evidenced by diet history.. INTERVENTION:  Make more breakfast meals at home. Consider some of the high fiber cereals; add to yogurt to give crunch and fiber. Refer to list of foods and fiber content with goal of 20-25 gms daily.  Include a fruit or vegetable or both at meals with goal of at least 5 servings per day. Continue to use food guide plate to remind to balance protein,  carbohydrate servings (2-3 servings per meal or 30-50 grams per meal) and non-starchy vegetables. Continue with 1/2 mile on bike 5-6 days per week.   EDUCATION MATERIALS GIVEN:  . List of Foods and fiber content . Goals/ instructions  LEARNER/ who was taught:  . Patient   LEVEL OF UNDERSTANDING: . Partial understanding; needs review/ practice LEARNING BARRIERS: . None WILLINGNESS TO LEARN/READINESS FOR CHANGE: . Change in progress.  MONITORING AND EVALUATION:  Weight, diet, exercise            Follow-up: 01/05/17 at 1:30pm

## 2016-11-23 NOTE — Patient Instructions (Signed)
Make more breakfast meals at home. Consider some of the high fiber cereals; add to yogurt to give crunch and fiber. Refer to list of foods and fiber content with goal of 20-25 gms daily.  Include a fruit or vegetable or both at meals with goal of at least 5 servings per day. Continue to use food guide plate to remind to balance protein, carbohydrate servings (2-3 servings per meal or 30-50 grams per meal) and non-starchy vegetables. Continue with 1/2 mile on bike 5-6 days per week.

## 2017-01-05 ENCOUNTER — Encounter: Payer: Self-pay | Admitting: Dietician

## 2017-01-05 ENCOUNTER — Encounter: Payer: Medicare Other | Attending: Nurse Practitioner | Admitting: Dietician

## 2017-01-05 VITALS — BP 130/80 | Wt 246.1 lb

## 2017-01-05 DIAGNOSIS — E119 Type 2 diabetes mellitus without complications: Secondary | ICD-10-CM | POA: Insufficient documentation

## 2017-01-05 DIAGNOSIS — E669 Obesity, unspecified: Secondary | ICD-10-CM | POA: Insufficient documentation

## 2017-01-05 DIAGNOSIS — K219 Gastro-esophageal reflux disease without esophagitis: Secondary | ICD-10-CM | POA: Diagnosis present

## 2017-01-05 DIAGNOSIS — Z794 Long term (current) use of insulin: Secondary | ICD-10-CM

## 2017-01-05 NOTE — Progress Notes (Signed)
Diabetes Self-Management Education  Visit Type: First/Initial  Appt. Start Time: 1345 Appt. End Time: 1445 01/05/2017  Gina Tate, identified by name and date of birth, is a 66 y.o. female with a diagnosis of Diabetes: Type 2.   ASSESSMENT  Blood pressure 130/80, weight 246 lb 1.6 oz (111.6 kg). Body mass index is 39.72 kg/m.      Diabetes Self-Management Education - 01/05/17 1529      Visit Information   Visit Type First/Initial     Initial Visit   Diabetes Type Type 2     Pre-Education Assessment   Patient understands the diabetes disease and treatment process. Needs Review   Patient understands incorporating nutritional management into lifestyle. Needs Review   Patient undertands incorporating physical activity into lifestyle. Needs Review   Patient understands using medications safely. Needs Review   Patient understands monitoring blood glucose, interpreting and using results Demonstrates understanding / competency   Patient understands prevention, detection, and treatment of acute complications. Needs Review   Patient understands prevention, detection, and treatment of chronic complications. Needs Review   Patient understands how to develop strategies to address psychosocial issues. Needs Review   Patient understands how to develop strategies to promote health/change behavior. Needs Review     Complications   Last HgB A1C per patient/outside source 7.8 %   How often do you check your blood sugar? 1-2 times/day   Fasting Blood glucose range (mg/dL) 16-109;[id number];130-179   Have you had a dilated eye exam in the past 12 months? No   Have you had a dental exam in the past 12 months? No   Are you checking your feet? No     Dietary Intake   Breakfast 8:15am sausage or ham biscuit, hash browns, decaff.coffee   Snack (morning) 10:00 package of cheese crackers with cheese filling   Lunch 12:30 varies; brings from home; sandwich or leftovers from previous dinner, decaff.  coffee   Snack (afternoon) 2:30 banana   Dinner 5:30 Has been eating "alot of cereal" lately to larger meals   Snack (evening) 8:00pm- fruit cup or banana   Beverage(s) water, decaff. coffee     Exercise   Exercise Type ADL's     Patient Education   Nutrition management  Role of diet in the treatment of diabetes and the relationship between the three main macronutrients and blood glucose level;Carbohydrate counting;Meal timing in regards to the patients' current diabetes medication.;Meal options for control of blood glucose level and chronic complications.;Information on hints to eating out and maintain blood glucose control.   Physical activity and exercise  Role of exercise on diabetes management, blood pressure control and cardiac health.   Acute complications Taught treatment of hypoglycemia - the 15 rule.   Personal strategies to promote health Lifestyle issues that need to be addressed for better diabetes care     Individualized Goals (developed by patient)   Nutrition Follow meal plan discussed;General guidelines for healthy choices and portions discussed     Outcomes   Future DMSE --  3 weeks      Individualized Plan for Diabetes Self-Management Training:   Learning Objective:  Patient will have a greater understanding of diabetes self-management. Patient education plan is to attend individual and/or group sessions per assessed needs and concerns.   Plan:   Patient Instructions  Since eating breakfast at home is not an option: For breakfast, switch from sausage and ham biscuit to egg biscuit (English muffin would be a better choice), but acknowledge that  English muffin is hard to chew. Add tomato juice or a serving of fruit in place of hash browns.  Balance lunch and dinner with 2-3 oz protein, 2 starch servings and a serving of fruit and non-starchy vegetables. Refer to food guide plate and "Planning a Balanced Meal".   Limit fruit to 1 serving at a time but can have at  each meal and at snacks.  Use guide given for treatment of low blood sugars.        Education material provided:  Games developer (Diabetes) Planning a Balanced Meal  If problems or questions, patient to contact team via:  Darrel Reach, RD, CDE [phone]  Future DSME appointment:  (3 weeks) 01/26/17 at 3:30pm

## 2017-01-05 NOTE — Patient Instructions (Signed)
Since eating breakfast at home is not an option: For breakfast, switch from sausage and ham biscuit to egg biscuit (English muffin would be a better choice), but acknowledge that English muffin is hard to chew. Add tomato juice or a serving of fruit in place of hash browns.  Balance lunch and dinner with 2-3 oz protein, 2 starch servings and a serving of fruit and non-starchy vegetables. Refer to food guide plate and "Planning a Balanced Meal".   Limit fruit to 1 serving at a time but can have at each meal and at snacks.  Use guide given for treatment of low blood sugars.

## 2017-01-26 ENCOUNTER — Encounter: Payer: Medicare Other | Admitting: Dietician

## 2017-01-26 ENCOUNTER — Encounter: Payer: Self-pay | Admitting: Dietician

## 2017-01-26 VITALS — Wt 248.3 lb

## 2017-01-26 DIAGNOSIS — E119 Type 2 diabetes mellitus without complications: Secondary | ICD-10-CM

## 2017-01-26 DIAGNOSIS — E669 Obesity, unspecified: Secondary | ICD-10-CM | POA: Diagnosis not present

## 2017-01-26 DIAGNOSIS — Z794 Long term (current) use of insulin: Principal | ICD-10-CM

## 2017-01-26 NOTE — Patient Instructions (Addendum)
Breakfast meal examples: 1 cup oatmeal, fruit 1 egg, ww toast, fruit 1 cup Cereal with milk. Add a protein source (egg or 2 Tbsp nuts). Avoid fruit with cold cereal.  Lunch: 1/2 turkey(2 oz) or tuna or chicken or ham sandwich, salad or any non-starchy vegetables, fruit serving water  Dinner: 3-4 oz of meat, 2 starches ( 6 oz potato or 1 large ear of corn for examples) and non-starchy vegetables. Can add a small amount of fruit to any meal, water  Include at least 6 oz of protein food daily. Include a total of 5 servings of fruits/vegetables. Include at least 3 servings of whole grain daily.  Use food guide plate to compare to menu planned to see if meal will be balanced with protein, carbohydrate and non-starchy vegetables. Include at least 1 fat serving per meal if not already in the food.   Be consistent with taking iron supplement as well as protein foods that provide iron in the diet.  Ride exercise bike beginning with 5 minutes twice daily.

## 2017-01-26 NOTE — Progress Notes (Signed)
Diabetes Self-Management Education  Visit Type:  Follow-up  Appt. Start Time: 1545 Appt. End Time: 1645  01/26/2017  Ms. Gina CanavanSylvia Tate, identified by name and date of birth, is a 66 y.o. female with a diagnosis of Diabetes:  Type 2 Diabetes.   ASSESSMENT  Weight 248 lb 4.8 oz (112.6 kg). Body mass index is 40.08 kg/m.       Diabetes Self-Management Education - 01/26/17 1702      Complications   Last HgB A1C per patient/outside source 7.8 %   How often do you check your blood sugar? 1-2 times/day   Fasting Blood glucose range (mg/dL) [phone]   Have you had a dilated eye exam in the past 12 months? No   Have you had a dental exam in the past 12 months? No   Are you checking your feet? No     Dietary Intake   Breakfast egg biscuit, tomato juice or cereal,milk and fruit on weekends   Lunch sandwich, water or chicken/turkey pot pie or leftovers from dinner   Snack (afternoon) banana   Dinner Ex. country style steak, red skin potatoes, fried okra,   Snack (evening) banana   Beverage(s) water, decaf. coffee in am,      Exercise   Exercise Type ADL's     Patient Education   Nutrition management  Role of diet in the treatment of diabetes and the relationship between the three main macronutrients and blood glucose level;Food label reading, portion sizes and measuring food.;Carbohydrate counting;Meal timing in regards to the patients' current diabetes medication.;Information on hints to eating out and maintain blood glucose control.;Meal options for control of blood glucose level and chronic complications.   Physical activity and exercise  Role of exercise on diabetes management, blood pressure control and cardiac health.;Helped patient identify appropriate exercises in relation to his/her diabetes, diabetes complications and other health issue.   Acute complications --  Reviewed treatment of hypoglycemia   Chronic complications Relationship between chronic complications and blood  glucose control;Lipid levels, blood glucose control and heart disease   Personal strategies to promote health Lifestyle issues that need to be addressed for better diabetes care     Individualized Goals (developed by patient)   Nutrition Follow meal plan discussed   Physical Activity Exercise 5-7 days per week  Begin with 5 minutes on bike; 2 times daily   Monitoring  test my blood glucose as discussed     Post-Education Assessment   Patient understands the diabetes disease and treatment process. Demonstrates understanding / competency   Patient understands incorporating nutritional management into lifestyle. Needs Review   Patient undertands incorporating physical activity into lifestyle. Needs Review   Patient understands using medications safely. Demonstrates understanding / competency   Patient understands monitoring blood glucose, interpreting and using results Demonstrates understanding / competency   Patient understands prevention, detection, and treatment of acute complications. Needs Review   Patient understands prevention, detection, and treatment of chronic complications. Needs Review   Patient understands how to develop strategies to promote health/change behavior. Needs Review     Outcomes   Program Status Completed      Learning Objective:  Patient will have a greater understanding of diabetes self-management. Patient education plan is to attend individual and/or group sessions per assessed needs and concerns.   Plan:   Patient Instructions  Breakfast meal examples: 1 cup oatmeal, fruit 1 egg, ww toast, fruit 1 cup Cereal with milk. Add a protein source (egg or 2 Tbsp nuts). Avoid fruit  with cold cereal.  Lunch: 1/2 turkey(2 oz) or tuna or chicken or ham sandwich, salad or any non-starchy vegetables, fruit serving water  Dinner: 3-4 oz of meat, 2 starches ( 6 oz potato or 1 large ear of corn for examples) and non-starchy vegetables. Can add a small amount of  fruit to any meal, water  Include at least 6 oz of protein food daily. Include a total of 5 servings of fruits/vegetables. Include at least 3 servings of whole grain daily.  Use food guide plate to compare to menu planned to see if meal will be balanced with protein, carbohydrate and non-starchy vegetables. Include at least 1 fat serving per meal if not already in the food.   Be consistent with taking iron supplement as well as protein foods that provide iron in the diet.  Ride exercise bike beginning with 5 minutes twice daily.      Expected Outcomes:   (patient has not been able to consistently follow recommendations made.)  Education material provided:Food Guide Plate for Diabetes If problems or questions, patient to contact team via: Darrel Reach, RD,CDE  [phone]  Future DSME appointment: - PRN

## 2017-02-03 ENCOUNTER — Other Ambulatory Visit: Payer: Self-pay | Admitting: Gastroenterology

## 2017-02-03 DIAGNOSIS — R1310 Dysphagia, unspecified: Secondary | ICD-10-CM

## 2017-02-03 DIAGNOSIS — K219 Gastro-esophageal reflux disease without esophagitis: Secondary | ICD-10-CM

## 2017-02-22 ENCOUNTER — Ambulatory Visit
Admission: RE | Admit: 2017-02-22 | Discharge: 2017-02-22 | Disposition: A | Discharge status: Home / Self Care | Payer: Medicare Other | Source: Ambulatory Visit | Attending: Gastroenterology | Admitting: Gastroenterology

## 2017-02-22 DIAGNOSIS — K219 Gastro-esophageal reflux disease without esophagitis: Secondary | ICD-10-CM | POA: Insufficient documentation

## 2017-02-22 DIAGNOSIS — R1310 Dysphagia, unspecified: Secondary | ICD-10-CM | POA: Insufficient documentation

## 2017-02-22 DIAGNOSIS — K449 Diaphragmatic hernia without obstruction or gangrene: Secondary | ICD-10-CM | POA: Insufficient documentation

## 2017-03-23 ENCOUNTER — Other Ambulatory Visit: Payer: Self-pay | Admitting: Orthopedic Surgery

## 2017-03-23 DIAGNOSIS — M1712 Unilateral primary osteoarthritis, left knee: Secondary | ICD-10-CM

## 2017-03-31 ENCOUNTER — Ambulatory Visit
Admission: RE | Admit: 2017-03-31 | Discharge: 2017-03-31 | Disposition: A | Discharge status: Home / Self Care | Payer: Medicare Other | Source: Ambulatory Visit | Attending: Orthopedic Surgery | Admitting: Orthopedic Surgery

## 2017-03-31 DIAGNOSIS — M25462 Effusion, left knee: Secondary | ICD-10-CM | POA: Diagnosis not present

## 2017-03-31 DIAGNOSIS — M1712 Unilateral primary osteoarthritis, left knee: Secondary | ICD-10-CM | POA: Diagnosis present

## 2017-04-15 ENCOUNTER — Encounter
Admission: RE | Admit: 2017-04-15 | Discharge: 2017-04-15 | Disposition: A | Discharge status: Home / Self Care | Payer: Medicare Other | Source: Ambulatory Visit | Attending: Orthopedic Surgery | Admitting: Orthopedic Surgery

## 2017-04-15 DIAGNOSIS — I10 Essential (primary) hypertension: Secondary | ICD-10-CM | POA: Diagnosis not present

## 2017-04-15 DIAGNOSIS — Z01818 Encounter for other preprocedural examination: Secondary | ICD-10-CM | POA: Insufficient documentation

## 2017-04-15 HISTORY — DX: Cardiac arrhythmia, unspecified: I49.9

## 2017-04-15 HISTORY — DX: Dyspnea, unspecified: R06.00

## 2017-04-15 LAB — BASIC METABOLIC PANEL
ANION GAP: 8 (ref 5–15)
BUN: 13 mg/dL (ref 6–20)
CHLORIDE: 103 mmol/L (ref 101–111)
CO2: 28 mmol/L (ref 22–32)
Calcium: 8.9 mg/dL (ref 8.9–10.3)
Creatinine, Ser: 0.71 mg/dL (ref 0.44–1.00)
GFR calc non Af Amer: >60 mL/min (ref >60)
GLUCOSE: 256 mg/dL — AB (ref 65–99)
Potassium: 4.2 mmol/L (ref 3.5–5.1)
Sodium: 139 mmol/L (ref 135–145)

## 2017-04-15 LAB — URINALYSIS, COMPLETE (UACMP) WITH MICROSCOPIC
BILIRUBIN URINE: NEGATIVE
Bacteria, UA: NONE SEEN
GLUCOSE, UA: 150 mg/dL — AB
KETONES UR: 5 mg/dL — AB
NITRITE: NEGATIVE
PH: 5 (ref 5.0–8.0)
Protein, ur: NEGATIVE mg/dL
SPECIFIC GRAVITY, URINE: 1.017 (ref 1.005–1.030)

## 2017-04-15 LAB — SEDIMENTATION RATE: Sed Rate: 38 mm/hr — ABNORMAL HIGH (ref 0–30)

## 2017-04-15 LAB — CBC
HEMATOCRIT: 35 % (ref 35.0–47.0)
Hemoglobin: 11.4 g/dL — ABNORMAL LOW (ref 12.0–16.0)
MCH: 27.4 pg (ref 26.0–34.0)
MCHC: 32.7 g/dL (ref 32.0–36.0)
MCV: 83.6 fL (ref 80.0–100.0)
PLATELETS: 277 10*3/uL (ref 150–440)
RBC: 4.18 MIL/uL (ref 3.80–5.20)
RDW: 15.9 % — AB (ref 11.5–14.5)
WBC: 8.2 10*3/uL (ref 3.6–11.0)

## 2017-04-15 LAB — SURGICAL PCR SCREEN
MRSA, PCR: NEGATIVE
Staphylococcus aureus: NEGATIVE

## 2017-04-15 LAB — APTT: APTT: 31 s (ref 24–36)

## 2017-04-15 LAB — TYPE AND SCREEN
ABO/RH(D): A POS
Antibody Screen: NEGATIVE

## 2017-04-15 LAB — PROTIME-INR
INR: 1.05
Prothrombin Time: 13.7 seconds (ref 11.4–15.2)

## 2017-04-15 NOTE — Patient Instructions (Signed)
Your procedure is scheduled on: 04/22/17 Thurs Report to Same Day Surgery 2nd floor medical mall [facility](Medical Mall Entrance-take elevator on left to 2nd floor.  Check in with surgery information desk.) To find out your arrival time please call [phone] between 1PM - 3PM on  04/21/17 Wed Remember: Instructions that are not followed completely may result in serious medical risk, up to and including death, or upon the discretion of your surgeon and anesthesiologist your surgery may need to be rescheduled.    _x___ 1. Do not eat food or drink liquids after midnight. No gum chewing or                              hard candies.     __x__ 2. No Alcohol for 24 hours before or after surgery.   __x__3. No Smoking for 24 prior to surgery.   ____  4. Bring all medications with you on the day of surgery if instructed.    __x__ 5. Notify your doctor if there is any change in your medical condition     (cold, fever, infections).     Do not wear jewelry, make-up, hairpins, clips or nail polish.  Do not wear lotions, powders, or perfumes. You may wear deodorant.  Do not shave 48 hours prior to surgery. Men may shave face and neck.  Do not bring valuables to the hospital.    [facility] is not responsible for any belongings or valuables.               Contacts, dentures or bridgework may not be worn into surgery.  Leave your suitcase in the car. After surgery it may be brought to your room.  For patients admitted to the hospital, discharge time is determined by your                       treatment team.   Patients discharged the day of surgery will not be allowed to drive home.  You will need someone to drive you home and stay with you the night of your procedure.    Please read over the following fact sheets that you were given:   [facility] Preparing for Surgery and or MRSA Information   _x___ Take anti-hypertensive (unless it includes a diuretic), cardiac, seizure, asthma,     anti-reflux and  psychiatric medicines. These include:  1. atenolol (TENORMIN) 25 MG tablet  2.amLODipine (NORVASC) 10   3.lansoprazole (PREVACID  4.levothyroxine (SYNTHROID, LEVOTHROID) 137 MCG   5.  6.  ____Fleets enema or Magnesium Citrate as directed.   _x___ Use CHG Soap or sage wipes as directed on instruction sheet   ____ Use inhalers on the day of surgery and bring to hospital day of surgery  _x___ Stop Metformin and Janumet 2 days prior to surgery.    ____ Take 1/2 of usual insulin dose the night before surgery and none on the morning     surgery.   _x___ Follow recommendations from Cardiologist, Pulmonologist or PCP regarding          stopping Aspirin, Coumadin, Pllavix ,Eliquis, Effient, or Pradaxa, and Pletal.  Stop Aspirin 1 week if OK with physician  X____Stop Anti-inflammatories such as Advil, Aleve, Ibuprofen, Motrin, Naproxen, Naprosyn, Goodies powders or aspirin products. OK to take Tylenol and  Celebrex.   _x___ Stop supplements until after surgery.  But may continue Vitamin D, Vitamin B,       and multivitamin.   ____ Bring C-Pap to the hospital.

## 2017-04-16 LAB — URINE CULTURE: Culture: NO GROWTH

## 2017-04-21 MED ORDER — CEFAZOLIN SODIUM-DEXTROSE 2-4 GM/100ML-% IV SOLN
2.0000 g | Freq: Once | INTRAVENOUS | Status: AC
  Administered 2017-04-22: 2 g via INTRAVENOUS

## 2017-04-21 MED ORDER — SODIUM CHLORIDE 0.9 % IV SOLN
1000.0000 mg | INTRAVENOUS | Status: AC
  Administered 2017-04-22: 1000 mg via INTRAVENOUS
  Filled 2017-04-21: qty 10

## 2017-04-22 ENCOUNTER — Inpatient Hospital Stay
Admission: RE | Admit: 2017-04-22 | Discharge: 2017-04-25 | DRG: 470 | Disposition: A | Discharge status: Skilled Nursing Facility | Payer: Medicare Other | Attending: Orthopedic Surgery | Admitting: Orthopedic Surgery

## 2017-04-22 ENCOUNTER — Ambulatory Visit: Payer: Medicare Other | Admitting: Anesthesiology

## 2017-04-22 ENCOUNTER — Inpatient Hospital Stay: Payer: Medicare Other

## 2017-04-22 ENCOUNTER — Encounter: Payer: Self-pay | Admitting: *Deleted

## 2017-04-22 ENCOUNTER — Encounter
Admission: RE | Disposition: A | Discharge status: Skilled Nursing Facility | Payer: Self-pay | Source: Home / Self Care | Attending: Orthopedic Surgery

## 2017-04-22 DIAGNOSIS — I10 Essential (primary) hypertension: Secondary | ICD-10-CM | POA: Diagnosis present

## 2017-04-22 DIAGNOSIS — F0390 Unspecified dementia without behavioral disturbance: Secondary | ICD-10-CM | POA: Diagnosis present

## 2017-04-22 DIAGNOSIS — F329 Major depressive disorder, single episode, unspecified: Secondary | ICD-10-CM | POA: Diagnosis present

## 2017-04-22 DIAGNOSIS — Z8249 Family history of ischemic heart disease and other diseases of the circulatory system: Secondary | ICD-10-CM

## 2017-04-22 DIAGNOSIS — M11262 Other chondrocalcinosis, left knee: Secondary | ICD-10-CM | POA: Diagnosis present

## 2017-04-22 DIAGNOSIS — Z794 Long term (current) use of insulin: Secondary | ICD-10-CM | POA: Diagnosis not present

## 2017-04-22 DIAGNOSIS — E7800 Pure hypercholesterolemia, unspecified: Secondary | ICD-10-CM | POA: Diagnosis present

## 2017-04-22 DIAGNOSIS — M1712 Unilateral primary osteoarthritis, left knee: Secondary | ICD-10-CM | POA: Diagnosis present

## 2017-04-22 DIAGNOSIS — E119 Type 2 diabetes mellitus without complications: Secondary | ICD-10-CM | POA: Diagnosis present

## 2017-04-22 DIAGNOSIS — Z6841 Body Mass Index (BMI) 40.0 and over, adult: Secondary | ICD-10-CM

## 2017-04-22 DIAGNOSIS — Z7982 Long term (current) use of aspirin: Secondary | ICD-10-CM | POA: Diagnosis not present

## 2017-04-22 DIAGNOSIS — E785 Hyperlipidemia, unspecified: Secondary | ICD-10-CM | POA: Diagnosis present

## 2017-04-22 DIAGNOSIS — Z79899 Other long term (current) drug therapy: Secondary | ICD-10-CM | POA: Diagnosis not present

## 2017-04-22 DIAGNOSIS — E039 Hypothyroidism, unspecified: Secondary | ICD-10-CM | POA: Diagnosis present

## 2017-04-22 DIAGNOSIS — Z833 Family history of diabetes mellitus: Secondary | ICD-10-CM | POA: Diagnosis not present

## 2017-04-22 DIAGNOSIS — M25562 Pain in left knee: Secondary | ICD-10-CM | POA: Diagnosis present

## 2017-04-22 DIAGNOSIS — E669 Obesity, unspecified: Secondary | ICD-10-CM | POA: Diagnosis present

## 2017-04-22 DIAGNOSIS — Z8673 Personal history of transient ischemic attack (TIA), and cerebral infarction without residual deficits: Secondary | ICD-10-CM

## 2017-04-22 DIAGNOSIS — M25762 Osteophyte, left knee: Secondary | ICD-10-CM | POA: Diagnosis present

## 2017-04-22 DIAGNOSIS — G8918 Other acute postprocedural pain: Secondary | ICD-10-CM

## 2017-04-22 DIAGNOSIS — I951 Orthostatic hypotension: Secondary | ICD-10-CM | POA: Diagnosis not present

## 2017-04-22 DIAGNOSIS — K219 Gastro-esophageal reflux disease without esophagitis: Secondary | ICD-10-CM | POA: Diagnosis present

## 2017-04-22 HISTORY — PX: TOTAL KNEE ARTHROPLASTY: SHX125

## 2017-04-22 LAB — CBC
HCT: 35 % (ref 35.0–47.0)
HEMOGLOBIN: 11.5 g/dL — AB (ref 12.0–16.0)
MCH: 27.4 pg (ref 26.0–34.0)
MCHC: 32.7 g/dL (ref 32.0–36.0)
MCV: 83.8 fL (ref 80.0–100.0)
PLATELETS: 280 10*3/uL (ref 150–440)
RBC: 4.18 MIL/uL (ref 3.80–5.20)
RDW: 15.9 % — AB (ref 11.5–14.5)
WBC: 8.8 10*3/uL (ref 3.6–11.0)

## 2017-04-22 LAB — GLUCOSE, CAPILLARY
GLUCOSE-CAPILLARY: 229 mg/dL — AB (ref 65–99)
GLUCOSE-CAPILLARY: 196 mg/dL — AB (ref 65–99)
Glucose-Capillary: 228 mg/dL — ABNORMAL HIGH (ref 65–99)
Glucose-Capillary: 204 mg/dL — ABNORMAL HIGH (ref 65–99)
Glucose-Capillary: 211 mg/dL — ABNORMAL HIGH (ref 65–99)

## 2017-04-22 LAB — CREATININE, SERUM
CREATININE: 0.59 mg/dL (ref 0.44–1.00)
GFR calc Af Amer: >60 mL/min (ref >60)

## 2017-04-22 LAB — ABO/RH: ABO/RH(D): A POS

## 2017-04-22 SURGERY — ARTHROPLASTY, KNEE, TOTAL
Anesthesia: Spinal | Site: Knee | Laterality: Left | Wound class: Clean

## 2017-04-22 MED ORDER — FENTANYL CITRATE (PF) 100 MCG/2ML IJ SOLN: 25.0000 ug | INTRAMUSCULAR | Status: DC | PRN

## 2017-04-22 MED ORDER — INSULIN ASPART PROT & ASPART (70-30 MIX) 100 UNIT/ML ~~LOC~~ SUSP
30.0000 [IU] | Freq: Two times a day (BID) | SUBCUTANEOUS | Status: DC
  Administered 2017-04-23 – 2017-04-25 (×5): 30 [IU] via SUBCUTANEOUS
  Filled 2017-04-22 (×5): qty 10

## 2017-04-22 MED ORDER — ACETAMINOPHEN 650 MG RE SUPP
650.0000 mg | Freq: Four times a day (QID) | RECTAL | Status: DC | PRN
Start: 2017-04-22 — End: 2017-04-25

## 2017-04-22 MED ORDER — ACETAMINOPHEN 10 MG/ML IV SOLN
INTRAVENOUS | Status: DC | PRN
  Administered 2017-04-22: 1000 mg via INTRAVENOUS

## 2017-04-22 MED ORDER — BUPIVACAINE HCL (PF) 0.5 % IJ SOLN
INTRAMUSCULAR | Status: AC
  Filled 2017-04-22: qty 10

## 2017-04-22 MED ORDER — SODIUM CHLORIDE 0.9 % IV SOLN
INTRAVENOUS | Status: DC | PRN
  Administered 2017-04-22: 30 ug/min via INTRAVENOUS

## 2017-04-22 MED ORDER — OXYCODONE HCL 5 MG PO TABS: 5.0000 mg | ORAL_TABLET | Freq: Once | ORAL | Status: DC | PRN

## 2017-04-22 MED ORDER — DOCUSATE SODIUM 100 MG PO CAPS
100.0000 mg | ORAL_CAPSULE | Freq: Every day | ORAL | Status: DC
  Administered 2017-04-23 – 2017-04-25 (×3): 100 mg via ORAL
  Filled 2017-04-22 (×3): qty 1

## 2017-04-22 MED ORDER — ONDANSETRON HCL 4 MG/2ML IJ SOLN
INTRAMUSCULAR | Status: AC
  Filled 2017-04-22: qty 2

## 2017-04-22 MED ORDER — NEOMYCIN-POLYMYXIN B GU 40-200000 IR SOLN
Status: AC
  Filled 2017-04-22: qty 20

## 2017-04-22 MED ORDER — BUPIVACAINE LIPOSOME 1.3 % IJ SUSP
INTRAMUSCULAR | Status: AC
  Filled 2017-04-22: qty 20

## 2017-04-22 MED ORDER — MORPHINE SULFATE 10 MG/ML IJ SOLN
INTRAMUSCULAR | Status: DC | PRN
  Administered 2017-04-22: 10 mg

## 2017-04-22 MED ORDER — METOCLOPRAMIDE HCL 5 MG/ML IJ SOLN: 5.0000 mg | Freq: Three times a day (TID) | INTRAMUSCULAR | Status: DC | PRN

## 2017-04-22 MED ORDER — NEOMYCIN-POLYMYXIN B GU 40-200000 IR SOLN
Status: DC | PRN
  Administered 2017-04-22: 14 mL

## 2017-04-22 MED ORDER — LOSARTAN POTASSIUM 50 MG PO TABS
100.0000 mg | ORAL_TABLET | Freq: Every day | ORAL | Status: DC
  Administered 2017-04-23: 100 mg via ORAL
  Filled 2017-04-22 (×4): qty 2

## 2017-04-22 MED ORDER — ASPIRIN EC 81 MG PO TBEC
81.0000 mg | DELAYED_RELEASE_TABLET | Freq: Every day | ORAL | Status: DC
  Administered 2017-04-23 – 2017-04-25 (×3): 81 mg via ORAL
  Filled 2017-04-22 (×3): qty 1

## 2017-04-22 MED ORDER — PHENOL 1.4 % MT LIQD
1.0000 | OROMUCOSAL | Status: DC | PRN
Start: 2017-04-22 — End: 2017-04-25

## 2017-04-22 MED ORDER — ONDANSETRON HCL 4 MG/2ML IJ SOLN: 4.0000 mg | Freq: Four times a day (QID) | INTRAMUSCULAR | Status: DC | PRN

## 2017-04-22 MED ORDER — ACETAMINOPHEN 10 MG/ML IV SOLN
INTRAVENOUS | Status: AC
  Filled 2017-04-22: qty 100

## 2017-04-22 MED ORDER — MELATONIN 5 MG PO TABS
1.0000 | ORAL_TABLET | Freq: Every day | ORAL | Status: DC
  Administered 2017-04-23 – 2017-04-24 (×2): 5 mg via ORAL
  Filled 2017-04-22 (×4): qty 1

## 2017-04-22 MED ORDER — FENTANYL CITRATE (PF) 100 MCG/2ML IJ SOLN
INTRAMUSCULAR | Status: AC
  Filled 2017-04-22: qty 2

## 2017-04-22 MED ORDER — FENTANYL CITRATE (PF) 100 MCG/2ML IJ SOLN
INTRAMUSCULAR | Status: DC | PRN
  Administered 2017-04-22: 50 ug via INTRAVENOUS

## 2017-04-22 MED ORDER — PROPOFOL 10 MG/ML IV BOLUS
INTRAVENOUS | Status: AC
  Filled 2017-04-22: qty 20

## 2017-04-22 MED ORDER — PROPOFOL 500 MG/50ML IV EMUL
INTRAVENOUS | Status: DC | PRN
  Administered 2017-04-22: 45 ug/kg/min via INTRAVENOUS

## 2017-04-22 MED ORDER — EPINEPHRINE PF 1 MG/ML IJ SOLN
INTRAMUSCULAR | Status: AC
  Filled 2017-04-22: qty 1

## 2017-04-22 MED ORDER — SODIUM CHLORIDE 0.9 % IJ SOLN
INTRAMUSCULAR | Status: DC | PRN
  Administered 2017-04-22: 30 mL

## 2017-04-22 MED ORDER — NORTRIPTYLINE HCL 10 MG PO CAPS
10.0000 mg | ORAL_CAPSULE | Freq: Every day | ORAL | Status: DC
  Administered 2017-04-23 – 2017-04-24 (×2): 10 mg via ORAL
  Filled 2017-04-22 (×4): qty 1

## 2017-04-22 MED ORDER — SODIUM CHLORIDE 0.9 % IV SOLN
INTRAVENOUS | Status: DC
  Administered 2017-04-22 – 2017-04-24 (×2): via INTRAVENOUS

## 2017-04-22 MED ORDER — ENOXAPARIN SODIUM 40 MG/0.4ML ~~LOC~~ SOLN
40.0000 mg | Freq: Two times a day (BID) | SUBCUTANEOUS | Status: DC
  Administered 2017-04-23 – 2017-04-25 (×5): 40 mg via SUBCUTANEOUS
  Filled 2017-04-22 (×5): qty 0.4

## 2017-04-22 MED ORDER — METHOCARBAMOL 1000 MG/10ML IJ SOLN
500.0000 mg | Freq: Four times a day (QID) | INTRAVENOUS | Status: DC | PRN
  Filled 2017-04-22: qty 5

## 2017-04-22 MED ORDER — CEFAZOLIN SODIUM-DEXTROSE 2-4 GM/100ML-% IV SOLN
INTRAVENOUS | Status: AC
  Filled 2017-04-22: qty 100

## 2017-04-22 MED ORDER — OXYCODONE HCL 5 MG/5ML PO SOLN: 5.0000 mg | Freq: Once | ORAL | Status: DC | PRN

## 2017-04-22 MED ORDER — INSULIN ASPART 100 UNIT/ML ~~LOC~~ SOLN
0.0000 [IU] | Freq: Three times a day (TID) | SUBCUTANEOUS | Status: DC
  Administered 2017-04-22: 3 [IU] via SUBCUTANEOUS
  Administered 2017-04-22: 5 [IU] via SUBCUTANEOUS
  Administered 2017-04-23: 8 [IU] via SUBCUTANEOUS
  Administered 2017-04-23 (×2): 11 [IU] via SUBCUTANEOUS
  Administered 2017-04-24 (×2): 8 [IU] via SUBCUTANEOUS
  Administered 2017-04-24 – 2017-04-25 (×2): 5 [IU] via SUBCUTANEOUS
  Administered 2017-04-25: 3 [IU] via SUBCUTANEOUS
  Filled 2017-04-22 (×10): qty 1

## 2017-04-22 MED ORDER — LIDOCAINE HCL (PF) 2 % IJ SOLN
INTRAMUSCULAR | Status: AC
  Filled 2017-04-22: qty 2

## 2017-04-22 MED ORDER — ZOLPIDEM TARTRATE 5 MG PO TABS: 5.0000 mg | ORAL_TABLET | Freq: Every evening | ORAL | Status: DC | PRN

## 2017-04-22 MED ORDER — KETOROLAC TROMETHAMINE 30 MG/ML IJ SOLN
INTRAMUSCULAR | Status: DC | PRN
  Administered 2017-04-22: 30 mg

## 2017-04-22 MED ORDER — VITAMIN B-12 1000 MCG PO TABS
1000.0000 ug | ORAL_TABLET | Freq: Two times a day (BID) | ORAL | Status: DC
  Administered 2017-04-23 – 2017-04-25 (×5): 1000 ug via ORAL
  Filled 2017-04-22 (×6): qty 1

## 2017-04-22 MED ORDER — PROPOFOL 500 MG/50ML IV EMUL
INTRAVENOUS | Status: AC
  Filled 2017-04-22: qty 50

## 2017-04-22 MED ORDER — METHOCARBAMOL 500 MG PO TABS
500.0000 mg | ORAL_TABLET | Freq: Four times a day (QID) | ORAL | Status: DC | PRN
  Administered 2017-04-23 (×2): 500 mg via ORAL
  Filled 2017-04-22 (×3): qty 1

## 2017-04-22 MED ORDER — MAGNESIUM HYDROXIDE 400 MG/5ML PO SUSP
30.0000 mL | Freq: Every day | ORAL | Status: DC | PRN
  Administered 2017-04-24 – 2017-04-25 (×2): 30 mL via ORAL
  Filled 2017-04-22 (×3): qty 30

## 2017-04-22 MED ORDER — MENTHOL 3 MG MT LOZG: 1.0000 | LOZENGE | OROMUCOSAL | Status: DC | PRN

## 2017-04-22 MED ORDER — FERROUS SULFATE 325 (65 FE) MG PO TABS
325.0000 mg | ORAL_TABLET | Freq: Every day | ORAL | Status: DC
  Administered 2017-04-23 – 2017-04-25 (×3): 325 mg via ORAL
  Filled 2017-04-22 (×3): qty 1

## 2017-04-22 MED ORDER — PHENYLEPHRINE HCL 10 MG/ML IJ SOLN
INTRAMUSCULAR | Status: DC | PRN
Start: 2017-04-22 — End: 2017-04-22
  Administered 2017-04-22: 100 ug via INTRAVENOUS
  Administered 2017-04-22: 200 ug via INTRAVENOUS

## 2017-04-22 MED ORDER — CA PHOSPHATE-CHOLECALCIFEROL 250-350 MG-UNIT PO CHEW: 1.0000 | CHEWABLE_TABLET | Freq: Three times a day (TID) | ORAL | Status: DC

## 2017-04-22 MED ORDER — BUPIVACAINE HCL (PF) 0.5 % IJ SOLN
INTRAMUSCULAR | Status: DC | PRN
  Administered 2017-04-22: 3 mL

## 2017-04-22 MED ORDER — MORPHINE SULFATE (PF) 10 MG/ML IV SOLN
INTRAVENOUS | Status: AC
  Filled 2017-04-22: qty 1

## 2017-04-22 MED ORDER — ONDANSETRON HCL 4 MG PO TABS: 4.0000 mg | ORAL_TABLET | Freq: Four times a day (QID) | ORAL | Status: DC | PRN

## 2017-04-22 MED ORDER — ALUM & MAG HYDROXIDE-SIMETH 200-200-20 MG/5ML PO SUSP: 30.0000 mL | ORAL | Status: DC | PRN

## 2017-04-22 MED ORDER — PHENYLEPHRINE HCL 10 MG/ML IJ SOLN
INTRAMUSCULAR | Status: AC
  Filled 2017-04-22: qty 1

## 2017-04-22 MED ORDER — ATORVASTATIN CALCIUM 10 MG PO TABS
10.0000 mg | ORAL_TABLET | Freq: Every day | ORAL | Status: DC
  Administered 2017-04-23 – 2017-04-24 (×2): 10 mg via ORAL
  Filled 2017-04-22 (×2): qty 1

## 2017-04-22 MED ORDER — ACETAMINOPHEN 325 MG PO TABS
650.0000 mg | ORAL_TABLET | Freq: Four times a day (QID) | ORAL | Status: DC | PRN
  Administered 2017-04-24 – 2017-04-25 (×2): 650 mg via ORAL
  Filled 2017-04-22 (×2): qty 2

## 2017-04-22 MED ORDER — PANTOPRAZOLE SODIUM 40 MG PO TBEC
40.0000 mg | DELAYED_RELEASE_TABLET | Freq: Every day | ORAL | Status: DC
  Administered 2017-04-23 – 2017-04-25 (×3): 40 mg via ORAL
  Filled 2017-04-22 (×3): qty 1

## 2017-04-22 MED ORDER — GLYCOPYRROLATE 0.2 MG/ML IJ SOLN
INTRAMUSCULAR | Status: AC
  Filled 2017-04-22: qty 1

## 2017-04-22 MED ORDER — ATENOLOL 25 MG PO TABS
25.0000 mg | ORAL_TABLET | Freq: Every evening | ORAL | Status: DC
  Administered 2017-04-23 – 2017-04-24 (×2): 25 mg via ORAL
  Filled 2017-04-22 (×3): qty 1

## 2017-04-22 MED ORDER — OXYCODONE HCL 5 MG PO TABS
5.0000 mg | ORAL_TABLET | ORAL | Status: DC | PRN
  Administered 2017-04-22: 5 mg via ORAL
  Administered 2017-04-23 (×3): 10 mg via ORAL
  Administered 2017-04-24 – 2017-04-25 (×4): 5 mg via ORAL
  Filled 2017-04-22: qty 2
  Filled 2017-04-22 (×3): qty 1
  Filled 2017-04-22: qty 2
  Filled 2017-04-22: qty 1
  Filled 2017-04-22: qty 2
  Filled 2017-04-22: qty 1

## 2017-04-22 MED ORDER — SODIUM CHLORIDE 0.9 % IJ SOLN
INTRAMUSCULAR | Status: AC
  Filled 2017-04-22: qty 10

## 2017-04-22 MED ORDER — BISACODYL 10 MG RE SUPP
10.0000 mg | Freq: Every day | RECTAL | Status: DC | PRN
  Filled 2017-04-22: qty 1

## 2017-04-22 MED ORDER — PROPOFOL 10 MG/ML IV BOLUS
INTRAVENOUS | Status: DC | PRN
  Administered 2017-04-22: 30 mg via INTRAVENOUS

## 2017-04-22 MED ORDER — MAGNESIUM CITRATE PO SOLN: 1.0000 | Freq: Once | ORAL | Status: DC | PRN

## 2017-04-22 MED ORDER — METOCLOPRAMIDE HCL 10 MG PO TABS: 5.0000 mg | ORAL_TABLET | Freq: Three times a day (TID) | ORAL | Status: DC | PRN

## 2017-04-22 MED ORDER — MIDAZOLAM HCL 2 MG/2ML IJ SOLN
INTRAMUSCULAR | Status: AC
  Filled 2017-04-22: qty 2

## 2017-04-22 MED ORDER — METFORMIN HCL 500 MG PO TABS
1000.0000 mg | ORAL_TABLET | Freq: Two times a day (BID) | ORAL | Status: DC
  Administered 2017-04-23 – 2017-04-25 (×6): 1000 mg via ORAL
  Filled 2017-04-22 (×7): qty 2

## 2017-04-22 MED ORDER — ONDANSETRON HCL 4 MG/2ML IJ SOLN
INTRAMUSCULAR | Status: DC | PRN
  Administered 2017-04-22: 4 mg via INTRAVENOUS

## 2017-04-22 MED ORDER — SODIUM CHLORIDE 0.9 % IV SOLN
INTRAVENOUS | Status: DC | PRN
  Administered 2017-04-22: 60 mL

## 2017-04-22 MED ORDER — SODIUM CHLORIDE 0.9 % IJ SOLN
INTRAMUSCULAR | Status: AC
  Filled 2017-04-22: qty 50

## 2017-04-22 MED ORDER — LIDOCAINE HCL (CARDIAC) 20 MG/ML IV SOLN
INTRAVENOUS | Status: DC | PRN
  Administered 2017-04-22: 80 mg via INTRAVENOUS

## 2017-04-22 MED ORDER — VENLAFAXINE HCL ER 75 MG PO CP24
75.0000 mg | ORAL_CAPSULE | Freq: Every day | ORAL | Status: DC
  Administered 2017-04-23 – 2017-04-24 (×2): 75 mg via ORAL
  Filled 2017-04-22 (×4): qty 1

## 2017-04-22 MED ORDER — EPHEDRINE SULFATE 50 MG/ML IJ SOLN
INTRAMUSCULAR | Status: AC
  Filled 2017-04-22: qty 1

## 2017-04-22 MED ORDER — DIPHENHYDRAMINE HCL 12.5 MG/5ML PO ELIX: 12.5000 mg | ORAL_SOLUTION | ORAL | Status: DC | PRN

## 2017-04-22 MED ORDER — SODIUM CHLORIDE 0.9 % IV SOLN
INTRAVENOUS | Status: DC
  Administered 2017-04-22: 07:00:00 via INTRAVENOUS

## 2017-04-22 MED ORDER — BUPIVACAINE-EPINEPHRINE (PF) 0.25% -1:200000 IJ SOLN
INTRAMUSCULAR | Status: DC | PRN
  Administered 2017-04-22: 30 mL

## 2017-04-22 MED ORDER — MORPHINE SULFATE (PF) 2 MG/ML IV SOLN
2.0000 mg | INTRAVENOUS | Status: DC | PRN
Start: 2017-04-22 — End: 2017-04-25

## 2017-04-22 MED ORDER — CEFAZOLIN SODIUM-DEXTROSE 2-4 GM/100ML-% IV SOLN
2.0000 g | Freq: Four times a day (QID) | INTRAVENOUS | Status: AC
  Administered 2017-04-22 – 2017-04-23 (×3): 2 g via INTRAVENOUS
  Filled 2017-04-22 (×3): qty 100

## 2017-04-22 MED ORDER — MIDAZOLAM HCL 5 MG/5ML IJ SOLN
INTRAMUSCULAR | Status: DC | PRN
Start: 2017-04-22 — End: 2017-04-22
  Administered 2017-04-22 (×2): 1 mg via INTRAVENOUS

## 2017-04-22 MED ORDER — AMLODIPINE BESYLATE 10 MG PO TABS
10.0000 mg | ORAL_TABLET | Freq: Every day | ORAL | Status: DC
  Filled 2017-04-22 (×2): qty 1

## 2017-04-22 MED ORDER — LEVOTHYROXINE SODIUM 137 MCG PO TABS
137.0000 ug | ORAL_TABLET | Freq: Every day | ORAL | Status: DC
  Administered 2017-04-23 – 2017-04-25 (×3): 137 ug via ORAL
  Filled 2017-04-22 (×3): qty 1

## 2017-04-22 SURGICAL SUPPLY — 61 items
BANDAGE ACE 6X5 VEL STRL LF (GAUZE/BANDAGES/DRESSINGS) ×3 IMPLANT
BLADE SAW 1 (BLADE) ×3 IMPLANT
BLOCK CUTTING FEMUR 3+ LT MED (MISCELLANEOUS) IMPLANT
BLOCK CUTTING TIBIAL 3 LT (MISCELLANEOUS) IMPLANT
BONE CEMENT GENTAMICIN (Cement) ×6 IMPLANT
CANISTER SUCT 1200ML W/VALVE (MISCELLANEOUS) ×3 IMPLANT
CANISTER SUCT 3000ML PPV (MISCELLANEOUS) ×6 IMPLANT
CAPT KNEE TOTAL 3 ×3 IMPLANT
CATH FOL LEG HOLDER (MISCELLANEOUS) ×3 IMPLANT
CATH TRAY METER 16FR LF (MISCELLANEOUS) ×3 IMPLANT
CEMENT BONE GENTAMICIN 40 (Cement) ×2 IMPLANT
CHLORAPREP W/TINT 26ML (MISCELLANEOUS) ×6 IMPLANT
COOLER POLAR GLACIER W/PUMP (MISCELLANEOUS) ×3 IMPLANT
CUFF TOURN 24 STER (MISCELLANEOUS) IMPLANT
CUFF TOURN 30 STER DUAL PORT (MISCELLANEOUS) IMPLANT
DRAPE SHEET LG 3/4 BI-LAMINATE (DRAPES) ×6 IMPLANT
ELECT CAUTERY BLADE 6.4 (BLADE) ×3 IMPLANT
ELECT REM PT RETURN 9FT ADLT (ELECTROSURGICAL) ×3
ELECTRODE REM PT RTRN 9FT ADLT (ELECTROSURGICAL) ×1 IMPLANT
GAUZE PETRO XEROFOAM 1X8 (MISCELLANEOUS) ×3 IMPLANT
GAUZE SPONGE 4X4 12PLY STRL (GAUZE/BANDAGES/DRESSINGS) ×3 IMPLANT
GLOVE BIOGEL PI IND STRL 9 (GLOVE) ×1 IMPLANT
GLOVE BIOGEL PI INDICATOR 9 (GLOVE) ×2
GLOVE INDICATOR 8.0 STRL GRN (GLOVE) ×3 IMPLANT
GLOVE SURG ORTHO 8.0 STRL STRW (GLOVE) ×3 IMPLANT
GLOVE SURG SYN 9.0  PF PI (GLOVE) ×2
GLOVE SURG SYN 9.0 PF PI (GLOVE) ×1 IMPLANT
GOWN SRG 2XL LVL 4 RGLN SLV (GOWNS) ×1 IMPLANT
GOWN STRL NON-REIN 2XL LVL4 (GOWNS) ×2
GOWN STRL REUS W/ TWL LRG LVL3 (GOWN DISPOSABLE) ×1 IMPLANT
GOWN STRL REUS W/ TWL XL LVL3 (GOWN DISPOSABLE) ×1 IMPLANT
GOWN STRL REUS W/TWL LRG LVL3 (GOWN DISPOSABLE) ×2
GOWN STRL REUS W/TWL XL LVL3 (GOWN DISPOSABLE) ×2
HOOD PEEL AWAY FLYTE STAYCOOL (MISCELLANEOUS) ×6 IMPLANT
IMMBOLIZER KNEE 19 BLUE UNIV (SOFTGOODS) ×3 IMPLANT
KIT RM TURNOVER STRD PROC AR (KITS) ×3 IMPLANT
KNEE MEDACTA TIBIAL/FEMORAL BL (Knees) ×3 IMPLANT
KNIFE SCULPS 14X20 (INSTRUMENTS) ×3 IMPLANT
MY KNEE FEMUR BONE MODEL MEDACTA IMPLANT
NDL SAFETY 18GX1.5 (NEEDLE) ×3 IMPLANT
NEEDLE SPNL 18GX3.5 QUINCKE PK (NEEDLE) ×3 IMPLANT
NEEDLE SPNL 20GX3.5 QUINCKE YW (NEEDLE) ×3 IMPLANT
NS IRRIG 1000ML POUR BTL (IV SOLUTION) ×3 IMPLANT
PACK TOTAL KNEE (MISCELLANEOUS) ×3 IMPLANT
PAD WRAPON POLAR KNEE (MISCELLANEOUS) ×1 IMPLANT
PULSAVAC PLUS IRRIG FAN TIP (DISPOSABLE) ×3
SOL .9 NS 3000ML IRR  AL (IV SOLUTION) ×2
SOL .9 NS 3000ML IRR UROMATIC (IV SOLUTION) ×1 IMPLANT
STAPLER SKIN PROX 35W (STAPLE) ×3 IMPLANT
SUCTION FRAZIER HANDLE 10FR (MISCELLANEOUS) ×2
SUCTION TUBE FRAZIER 10FR DISP (MISCELLANEOUS) ×1 IMPLANT
SUT DVC 2 QUILL PDO  T11 36X36 (SUTURE) ×2
SUT DVC 2 QUILL PDO T11 36X36 (SUTURE) ×1 IMPLANT
SUT V-LOC 90 ABS DVC 3-0 CL (SUTURE) ×3 IMPLANT
SYR 20CC LL (SYRINGE) ×3 IMPLANT
SYR 50ML LL SCALE MARK (SYRINGE) ×6 IMPLANT
TIBIAL BONE MODEL LEFT (MISCELLANEOUS) IMPLANT
TIP FAN IRRIG PULSAVAC PLUS (DISPOSABLE) ×1 IMPLANT
TOWEL OR 17X26 4PK STRL BLUE (TOWEL DISPOSABLE) ×3 IMPLANT
TOWER CARTRIDGE SMART MIX (DISPOSABLE) ×3 IMPLANT
WRAPON POLAR PAD KNEE (MISCELLANEOUS) ×3

## 2017-04-22 NOTE — Evaluation (Signed)
Physical Therapy Evaluation Patient Details Name: Gina Tate MRN: [id number] DOB: [birth year] Today's Date: 04/22/2017   History of Present Illness  Pt underwent L TKR without reported post-op complications. She is POD#0 at time of initial evaluation. PMH includes depression, TIA, OA, SVT, GERD, headaches, HTN, and DM.   Clinical Impression  Pt admitted with above diagnosis. Pt currently with functional limitations due to the deficits listed below (see PT Problem List).  Pt able to complete bed exercises with therapist with good LLE strength noted. After completing exercises in the bed pt reports feeling "unwell, like my blood sugar is low." RN contacted who checks vitals and BG. VSS in supine and BG is elevated. Pt requires minA+1 to come upright to EOB with therapist. Once in sitting after 30s-60s pt reports feeling lightheaded. Dynamap brought over however before vitals obtained pt reports that she feels like she may pass out. Pt returned to supine and vitals obtained. BP is 107/78 after 2-3 minutes of patient in supine. Unable to obtain seated vitals due to symptoms. Pt starts to feel improved after laying down. Further transfers and amulation deferred at this time due to patient feeling unwell. She reports a history of prior syncopal episodes. Pt brought up to seated position in bed in order to help her acclimate to being upright and RN notified of events. RN to dangle later in the PM and pt would be appropriate to attempt transfers with nursing if she desires to get up. Will progress transfers and ambulation with therapist on later date as appropriate. Pt will benefit from PT services to address deficits in strength, balance, and mobility in order to return to full function at home.     Follow Up Recommendations Home health PT    Equipment Recommendations  Rolling walker with 5" wheels    Recommendations for Other Services       Precautions / Restrictions Precautions Precautions:  Fall;Knee Required Braces or Orthoses: Knee Immobilizer - Left Knee Immobilizer - Left: Discontinue once straight leg raise with < 10 degree lag Restrictions Weight Bearing Restrictions: Yes LLE Weight Bearing: Weight bearing as tolerated      Mobility  Bed Mobility Overal bed mobility: Needs Assistance Bed Mobility: Supine to Sit     Supine to sit: Min assist     General bed mobility comments: MinA+1 to assist with LE and to bring trunk upright to sitting. Once in sitting after 30s-60s pt reports feeling lightheaded. Dynamap brought over however before vitals obtained pt reports that she feels like she may pass out. Pt returned to supine and vitals obtained. BP is 107/78 but obtained after 2-3 minutes of patient laying in bed. Pt starts to feel improved after laying down. Further transfers and amulation deferred at this time. Pt brought up to seated position in bed and RN notified of events. RN to dangle later.  Transfers                 General transfer comment: Unable to attempt due to presyncopal symptoms in sitting  Ambulation/Gait                Stairs            Wheelchair Mobility    Modified Rankin (Stroke Patients Only)       Balance Overall balance assessment: Needs assistance Sitting-balance support: No upper extremity supported Sitting balance-Leahy Scale: Good  Pertinent Vitals/Pain Pain Assessment: Faces Faces Pain Scale: Hurts a little bit Pain Location: Posterior L knee soreness Pain Descriptors / Indicators: Sore Pain Intervention(s): Monitored during session    Home Living Family/patient expects to be discharged to:: Private residence Living Arrangements: Spouse/significant other Available Help at Discharge: Family;Available PRN/intermittently Type of Home: Mobile home Home Access: Stairs to enter Entrance Stairs-Rails: Can reach both;Right;Left Entrance Stairs-Number of  Steps: 7 (3+4) Home Layout: One level Home Equipment: Cane - single point (no walker, no BSC, no shower seat)      Prior Function Level of Independence: Independent         Comments: Independent with ADLs/IADLs. Ambulating with a spc in the days leading up to the surgery. Reports 1 fall in the last 12 monts     Hand Dominance   Dominant Hand: Right    Extremity/Trunk Assessment   Upper Extremity Assessment Upper Extremity Assessment: Overall WFL for tasks assessed    Lower Extremity Assessment Lower Extremity Assessment: LLE deficits/detail LLE Deficits / Details: Able to perform SLR and SAQ without assist. Full DF/PF. Reports intact sensation to light touch. RLE strength appears grossly WFL       Communication   Communication: No difficulties  Cognition Arousal/Alertness: Awake/alert Behavior During Therapy: WFL for tasks assessed/performed Overall Cognitive Status: Within Functional Limits for tasks assessed                                        General Comments      Exercises Total Joint Exercises Ankle Circles/Pumps: AROM;Both;10 reps;Supine Quad Sets: Strengthening;Both;10 reps;Supine Gluteal Sets: Strengthening;Both;10 reps;Supine Towel Squeeze: Strengthening;Both;10 reps;Supine Short Arc Quad: Strengthening;Left;10 reps;Supine Heel Slides: Strengthening;Left;10 reps;Supine Hip ABduction/ADduction: Strengthening;Left;10 reps;Supine Straight Leg Raises: Strengthening;Left;10 reps;Supine Goniometric ROM: -2 to 60 degrees, AAROM and limited by pain. Measured knee flexion in supine   Assessment/Plan    PT Assessment Patient needs continued PT services  PT Problem List Decreased strength;Decreased range of motion;Decreased activity tolerance;Decreased balance;Decreased mobility;Pain       PT Treatment Interventions DME instruction;Gait training;Stair training;Therapeutic activities;Therapeutic exercise;Balance training;Neuromuscular  re-education;Patient/family education;Manual techniques    PT Goals (Current goals can be found in the Care Plan section)  Acute Rehab PT Goals Patient Stated Goal: Return to prior function at home PT Goal Formulation: With patient/family Time For Goal Achievement: 05/06/17 Potential to Achieve Goals: Good    Frequency BID   Barriers to discharge Decreased caregiver support Pt has periods where she is home alone while husband works    Co-evaluation               AM-PAC PT "6 Clicks" Daily Activity  Outcome Measure Difficulty turning over in bed (including adjusting bedclothes, sheets and blankets)?: Unable Difficulty moving from lying on back to sitting on the side of the bed? : Unable Difficulty sitting down on and standing up from a chair with arms (e.g., wheelchair, bedside commode, etc,.)?: Unable Help needed moving to and from a bed to chair (including a wheelchair)?: Total Help needed walking in hospital room?: Total Help needed climbing 3-5 steps with a railing? : Total 6 Click Score: 6    End of Session   Activity Tolerance: Treatment limited secondary to medical complications (Comment);Other (comment) (Pt feeling lightheaded) Patient left: with bed alarm set;in bed;with call bell/phone within reach;with family/visitor present;with SCD's reapplied;Other (comment) (bone foam and polar care in place) Nurse Communication: Mobility status;Other (  comment) (Pt feeling lightheaded) PT Visit Diagnosis: Difficulty in walking, not elsewhere classified (R26.2);Muscle weakness (generalized) (M62.81);Pain Pain - Right/Left: Left Pain - part of body: Knee    Time: 1526-1550 PT Time Calculation (min) (ACUTE ONLY): 24 min   Charges:   PT Evaluation $PT Eval Low Complexity: 1 Low PT Treatments $Therapeutic Exercise: 8-22 mins   PT G Codes:   PT G-Codes **NOT FOR INPATIENT CLASS** Functional Assessment Tool Used: AM-PAC 6 Clicks Basic Mobility Functional Limitation:  Mobility: Walking and moving around Mobility: Walking and Moving Around Current Status (Z6109): 100 percent impaired, limited or restricted Mobility: Walking and Moving Around Goal Status (U0454): At least 1 percent but less than 20 percent impaired, limited or restricted    Lynnea Maizes PT, DPT   Huprich,Jason 04/22/2017, 4:24 PM

## 2017-04-22 NOTE — Anesthesia Procedure Notes (Signed)
Spinal  Patient location during procedure: OR Start time: 04/22/2017 7:25 AM End time: 04/22/2017 7:29 AM Staffing Anesthesiologist: Andria Frames Resident/CRNA: Johnna Acosta Performed: resident/CRNA  Preanesthetic Checklist Completed: patient identified, site marked, surgical consent, pre-op evaluation, timeout performed, IV checked, risks and benefits discussed and monitors and equipment checked Spinal Block Patient position: sitting Prep: ChloraPrep Patient monitoring: heart rate, continuous pulse ox, blood pressure and cardiac monitor Approach: midline Location: L4-5 Injection technique: single-shot Needle Needle type: Whitacre and Introducer  Needle gauge: 24 G Needle length: 9 cm Assessment Sensory level: T8 Additional Notes Negative paresthesia. Negative blood return. Positive free-flowing CSF. Expiration date of kit checked and confirmed. Patient tolerated procedure well, without complications.

## 2017-04-22 NOTE — Anesthesia Preprocedure Evaluation (Addendum)
Anesthesia Evaluation  Patient identified by MRN, date of birth, ID band Patient awake    Reviewed: Allergy & Precautions, H&P , NPO status , Patient's Chart, lab work & pertinent test results  History of Anesthesia Complications Negative for: history of anesthetic complications  Airway Mallampati: III  TM Distance: <3 FB Neck ROM: limited    Dental  (+) Poor Dentition, Chipped, Missing, Loose   Pulmonary shortness of breath and with exertion,           Cardiovascular Exercise Tolerance: Good hypertension, (-) angina+ DOE  + dysrhythmias Supra Ventricular Tachycardia      Neuro/Psych  Headaches, PSYCHIATRIC DISORDERS Depression TIA   GI/Hepatic Neg liver ROS, hiatal hernia, GERD  Medicated and Controlled,  Endo/Other  diabetes, Type 2, Insulin DependentHypothyroidism   Renal/GU      Musculoskeletal  (+) Arthritis ,   Abdominal   Peds  Hematology negative hematology ROS (+)   Anesthesia Other Findings Past Medical History: No date: Dementia No date: Depression No date: Diabetes mellitus, type II (HCC) No date: Dyspnea No date: Dysrhythmia No date: GERD (gastroesophageal reflux disease) No date: Headache No date: History of hiatal hernia No date: Hypercholesteremia No date: Hypertension No date: Hypothyroidism No date: Thyroid disease  Past Surgical History: No date: BACK SURGERY 10/07/2015: ESOPHAGOGASTRODUODENOSCOPY; N/A     Comment:  Procedure: ESOPHAGOGASTRODUODENOSCOPY (EGD);  Surgeon:               Wallace Cullens, MD;  Location: [facility] ENDOSCOPY;  Service:               Gastroenterology;  Laterality: N/A; No date: HAND SURGERY; Right No date: TONSILLECTOMY No date: TRIGGER FINGER RELEASE; Right No date: TUBAL LIGATION     Reproductive/Obstetrics negative OB ROS                            Anesthesia Physical Anesthesia Plan  ASA: III  Anesthesia Plan: Spinal   Post-op  Pain Management:    Induction:   PONV Risk Score and Plan:   Airway Management Planned: Natural Airway and Nasal Cannula  Additional Equipment:   Intra-op Plan:   Post-operative Plan:   Informed Consent: I have reviewed the patients History and Physical, chart, labs and discussed the procedure including the risks, benefits and alternatives for the proposed anesthesia with the patient or authorized representative who has indicated his/her understanding and acceptance.   Dental Advisory Given  Plan Discussed with: Anesthesiologist, CRNA and Surgeon  Anesthesia Plan Comments: (Patient reports no bleeding problems and no anticoagulant use.  Plan for spinal with backup GA  Patient consented for risks of anesthesia including but not limited to:  - adverse reactions to medications - risk of bleeding, infection, nerve damage and headache - risk of failed spinal - damage to teeth, lips or other oral mucosa - sore throat or hoarseness - Damage to heart, brain, lungs or loss of life  Patient voiced understanding.)        Anesthesia Quick Evaluation

## 2017-04-22 NOTE — Op Note (Signed)
04/22/2017  9:28 AM  PATIENT:  Gina Tate  66 y.o. female  PRE-OPERATIVE DIAGNOSIS:  PRIMARY OSTEOARTHRITIS LEFT KNEE  POST-OPERATIVE DIAGNOSIS:  PRIMARY OSTEOARTHRITIS LEFT KNEE  PROCEDURE:  Procedure(s): TOTAL KNEE ARTHROPLASTY (Left)  SURGEON: Leitha Schuller, MD  ASSISTANTS: [place] [facility]  ANESTHESIA:   spinal  EBL:  Total I/O In: 900 [I.V.:900] Out: 425 [Urine:400; Blood:25]  BLOOD ADMINISTERED:none  DRAINS: none   LOCAL MEDICATIONS USED:  MARCAINE    and OTHER Toradol morphine and Exparel  SPECIMEN:  No Specimen  DISPOSITION OF SPECIMEN:  N/A  COUNTS:  YES  TOURNIQUET:   64 minutes at 300 mmHg  IMPLANTS: Medacta GMK sphere left 3+ femur, 3 tibia was short stem, 13 mm tibial insert, to patella all components cemented  DICTATION: .Dragon Dictation   patient brought the operating room and after adequate general anesthesia was obtained the left  leg was prepped draped in sterile fashion was turned by the upper thigh. After patient identification and timeout procedures were completed, a midline skin incision was made with the knee in flexion followed by medial parapatellar arthrotomy. There is sclerotic bone in the medial compartment with significant wear to the femoral condyle and medial tibial condyle with moderate patellofemoral and mild lateral compartment changes. Anterior cruciate ligament and fat pad were excised. Medacta cutting guide applied after removing cartilage off the anterior aspect of the tibia and proximal tibia cut carried out. Next the distal femoral cut was carried out after applying the my knee cutting guide to it. The 3+ cutting block was applied anterior posterior and chamfer cuts made with no notching. Residual PCL removed at this time with excision of the posterior horns of the menisci. The 3 tibia baseplate was placed with appropriate rotation based on the preop templating and pin from [facility], The guide and proximal tibial preparation carried  out with drilling and placement of the keel. The 3+ femoral trial was placed a 13 mm insert gave excellent stability through range of motion was chosen for the final component. Distal femoral drill holes were made followed by the trochlear groove cut. Trials were removed and the knee held in extension and patella cut using the patellar cutting guide and sized to a size 2 after drilling holes were made.  Tourniquet was then raised and the bony surfaces thoroughly irrigated and dried the tibial component was cemented into place first with removal of excess cement followed by placement of the tibial polyethylene component with set screw using torque screwdriver. The distal femoral component was then applied and the knee held in extension with patellar button clamped into place with cement being used. After the cement had set excess cement was removed and the knee thoroughly irrigated with a tourniquet let down. Patella tracked well with no touch technique. The arthrotomy was repaired using a heavy Quill followed by 2-0 Vicryl subcutaneously. Skin was closed with staples followed by dressing of Xeroform 4 x 4's web roll Ace wrap and Polar Care   PLAN OF CARE: Admit to inpatient   PATIENT DISPOSITION:  PACU - hemodynamically stable.

## 2017-04-22 NOTE — H&P (Signed)
Reviewed paper H+P, will be scanned into chart. No changes noted.  

## 2017-04-22 NOTE — Anesthesia Procedure Notes (Signed)
Date/Time: 04/22/2017 7:32 AM Performed by: Ginger Carne Pre-anesthesia Checklist: Patient identified, Emergency Drugs available, Suction available, Patient being monitored and Timeout performed Patient Re-evaluated:Patient Re-evaluated prior to induction Oxygen Delivery Method: Simple face mask Preoxygenation: Pre-oxygenation with 100% oxygen

## 2017-04-22 NOTE — Anesthesia Post-op Follow-up Note (Signed)
Anesthesia QCDR form completed.        

## 2017-04-22 NOTE — Transfer of Care (Signed)
Immediate Anesthesia Transfer of Care Note  Patient: ANESSIA JAFFE  Procedure(s) Performed: Procedure(s): TOTAL KNEE ARTHROPLASTY (Left)  Patient Location: PACU  Anesthesia Type:Spinal  Level of Consciousness: awake, alert  and oriented  Airway & Oxygen Therapy: Patient Spontanous Breathing and Patient connected to face mask oxygen  Post-op Assessment: Report given to RN and Post -op Vital signs reviewed and stable  Post vital signs: Reviewed and stable  Last Vitals:  Vitals:   04/22/17 0620  BP: (!) 113/93  Pulse: 80  Resp: 18  Temp: 36.6 C  SpO2: 98%    Last Pain:  Vitals:   04/22/17 0620  TempSrc: Tympanic  PainSc: 0-No pain         Complications: No apparent anesthesia complications

## 2017-04-22 NOTE — Progress Notes (Signed)
Patient is admitted to room 140 from surgery. NSL, foley, and polar care in place. A&O x 4. Husband at bedside. Oriented to room, call light, TV and bed controls. Bed alarm on for safety. Bone foam applied.

## 2017-04-22 NOTE — NC FL2 (Signed)
[place] MEDICAID FL2 LEVEL OF CARE SCREENING TOOL     IDENTIFICATION  Patient Name: Gina Tate Birthdate: 12/03/1950 Sex: female Admission Date (Current Location): 04/22/2017  [place] Number:  Chiropodist and Address:  [facility], [address], [place], [place] [address]      Provider Number: [id number]  Attending Physician Name and Address:  Kennedy Bucker, MD  Relative Name and Phone Number:       Current Level of Care: Hospital Recommended Level of Care: Skilled Nursing Facility Prior Approval Number:    Date Approved/Denied:   PASRR Number:  [id number])  Discharge Plan: SNF    Current Diagnoses: Patient Active Problem List   Diagnosis Date Noted  . Primary localized osteoarthritis of left knee 04/22/2017  . Clinical depression 03/11/2015  . Type 2 diabetes mellitus treated with insulin (HCC) 06/04/2014  . Adult BMI 30+ 06/04/2014  . Difficulty in walking 03/05/2014  . Amnesia 03/05/2014  . Adiposity 03/05/2014  . Disordered sleep 03/05/2014  . Acquired hypothyroidism 12/07/2013  . Absolute anemia 12/07/2013  . Benign essential HTN 12/07/2013  . Arthritis, degenerative 12/07/2013  . Pure hypercholesterolemia 12/07/2013  . Type 2 diabetes mellitus (HCC) 12/07/2013  . Diabetes mellitus (HCC) 12/07/2013    Orientation RESPIRATION BLADDER Height & Weight     Self, Time, Situation, Place  Normal Continent Weight: 251 lb (113.9 kg) Height:  5\' 5"  (165.1 cm)  BEHAVIORAL SYMPTOMS/MOOD NEUROLOGICAL BOWEL NUTRITION STATUS   (none)   Continent Diet (Diet: Clear Liquid to be advanced. )  AMBULATORY STATUS COMMUNICATION OF NEEDS Skin   Extensive Assist Verbally Surgical wounds (Incision: Left Knee )                       Personal Care Assistance Level of Assistance  Bathing, Feeding, Dressing Bathing Assistance: Limited assistance Feeding assistance: Independent Dressing Assistance: Limited  assistance     Functional Limitations Info  Sight, Hearing, Speech Sight Info: Adequate Hearing Info: Adequate Speech Info: Adequate    SPECIAL CARE FACTORS FREQUENCY  PT (By licensed PT), OT (By licensed OT)     PT Frequency:  (5) OT Frequency:  (5)            Contractures      Additional Factors Info  Code Status, Allergies Code Status Info:  (Full Code. ) Allergies Info:  (No Known Allergies. )           Current Medications (04/22/2017):  This is the current hospital active medication list Current Facility-Administered Medications  Medication Dose Route Frequency Provider Last Rate Last Dose  . 0.9 %  sodium chloride infusion   Intravenous Continuous Kennedy Bucker, MD      . acetaminophen (TYLENOL) tablet 650 mg  650 mg Oral Q6H PRN Kennedy Bucker, MD       Or  . acetaminophen (TYLENOL) suppository 650 mg  650 mg Rectal Q6H PRN Kennedy Bucker, MD      . alum & mag hydroxide-simeth (MAALOX/MYLANTA) 200-200-20 MG/5ML suspension 30 mL  30 mL Oral Q4H PRN Kennedy Bucker, MD      . amLODipine (NORVASC) tablet 10 mg  10 mg Oral Daily Kennedy Bucker, MD      . aspirin EC tablet 81 mg  81 mg Oral Daily Kennedy Bucker, MD      . atenolol (TENORMIN) tablet 25 mg  25 mg Oral QPM Kennedy Bucker, MD      . atorvastatin (LIPITOR)  tablet 10 mg  10 mg Oral q1800 Kennedy Bucker, MD      . bisacodyl (DULCOLAX) suppository 10 mg  10 mg Rectal Daily PRN Kennedy Bucker, MD      . ceFAZolin (ANCEF) IVPB 2g/100 mL premix  2 g Intravenous Q6H Kennedy Bucker, MD      . diphenhydrAMINE (BENADRYL) 12.5 MG/5ML elixir 12.5-25 mg  12.5-25 mg Oral Q4H PRN Kennedy Bucker, MD      . docusate sodium (COLACE) capsule 100 mg  100 mg Oral Daily Kennedy Bucker, MD      . Melene Muller ON 04/23/2017] enoxaparin (LOVENOX) injection 40 mg  40 mg Subcutaneous Q12H Kennedy Bucker, MD      . Melene Muller ON 04/23/2017] ferrous sulfate tablet 325 mg  325 mg Oral Q breakfast Kennedy Bucker, MD      . insulin aspart (novoLOG) injection  0-15 Units  0-15 Units Subcutaneous TID WC Kennedy Bucker, MD      . insulin aspart protamine- aspart (NOVOLOG MIX 70/30) injection 30 Units  30 Units Subcutaneous BID WC Kennedy Bucker, MD      . Melene Muller ON 04/23/2017] levothyroxine (SYNTHROID, LEVOTHROID) tablet 137 mcg  137 mcg Oral QAC breakfast Kennedy Bucker, MD      . losartan (COZAAR) tablet 100 mg  100 mg Oral Daily Kennedy Bucker, MD      . magnesium citrate solution 1 Bottle  1 Bottle Oral Once PRN Kennedy Bucker, MD      . magnesium hydroxide (MILK OF MAGNESIA) suspension 30 mL  30 mL Oral Daily PRN Kennedy Bucker, MD      . Melatonin TABS 5 mg  1 tablet Oral QHS Kennedy Bucker, MD      . menthol-cetylpyridinium (CEPACOL) lozenge 3 mg  1 lozenge Oral PRN Kennedy Bucker, MD       Or  . phenol (CHLORASEPTIC) mouth spray 1 spray  1 spray Mouth/Throat PRN Kennedy Bucker, MD      . metFORMIN (GLUCOPHAGE) tablet 1,000 mg  1,000 mg Oral BID WC Kennedy Bucker, MD      . methocarbamol (ROBAXIN) tablet 500 mg  500 mg Oral Q6H PRN Kennedy Bucker, MD       Or  . methocarbamol (ROBAXIN) 500 mg in dextrose 5 % 50 mL IVPB  500 mg Intravenous Q6H PRN Kennedy Bucker, MD      . metoCLOPramide (REGLAN) tablet 5-10 mg  5-10 mg Oral Q8H PRN Kennedy Bucker, MD       Or  . metoCLOPramide (REGLAN) injection 5-10 mg  5-10 mg Intravenous Q8H PRN Kennedy Bucker, MD      . morphine 2 MG/ML injection 2 mg  2 mg Intravenous Q2H PRN Kennedy Bucker, MD      . nortriptyline (PAMELOR) capsule 10 mg  10 mg Oral QHS Kennedy Bucker, MD      . ondansetron [facility]) tablet 4 mg  4 mg Oral Q6H PRN Kennedy Bucker, MD       Or  . ondansetron [facility]) injection 4 mg  4 mg Intravenous Q6H PRN Kennedy Bucker, MD      . oxyCODONE (Oxy IR/ROXICODONE) immediate release tablet 5-10 mg  5-10 mg Oral Q3H PRN Kennedy Bucker, MD      . pantoprazole (PROTONIX) EC tablet 40 mg  40 mg Oral Daily Kennedy Bucker, MD      . venlafaxine XR (EFFEXOR-XR) 24 hr capsule 75 mg  75 mg Oral QHS Kennedy Bucker, MD       . vitamin B-12 (  CYANOCOBALAMIN) tablet 1,000 mcg  1,000 mcg Oral BID Kennedy Bucker, MD      . zolpidem [facility]) tablet 5 mg  5 mg Oral QHS PRN Kennedy Bucker, MD         Discharge Medications: Please see discharge summary for a list of discharge medications.  Relevant Imaging Results:  Relevant Lab Results:   Additional Information  (SSN: [id number])  Ahmet Schank, Darleen Crocker, LCSW

## 2017-04-23 ENCOUNTER — Encounter: Payer: Self-pay | Admitting: Orthopedic Surgery

## 2017-04-23 LAB — GLUCOSE, CAPILLARY
GLUCOSE-CAPILLARY: 316 mg/dL — AB (ref 65–99)
GLUCOSE-CAPILLARY: 338 mg/dL — AB (ref 65–99)
Glucose-Capillary: 296 mg/dL — ABNORMAL HIGH (ref 65–99)
Glucose-Capillary: 258 mg/dL — ABNORMAL HIGH (ref 65–99)

## 2017-04-23 LAB — BASIC METABOLIC PANEL
Anion gap: 8 (ref 5–15)
BUN: 7 mg/dL (ref 6–20)
CO2: 26 mmol/L (ref 22–32)
CREATININE: 0.59 mg/dL (ref 0.44–1.00)
Calcium: 8.2 mg/dL — ABNORMAL LOW (ref 8.9–10.3)
Chloride: 98 mmol/L — ABNORMAL LOW (ref 101–111)
GFR calc non Af Amer: >60 mL/min (ref >60)
Glucose, Bld: 285 mg/dL — ABNORMAL HIGH (ref 65–99)
Potassium: 4.4 mmol/L (ref 3.5–5.1)
SODIUM: 132 mmol/L — AB (ref 135–145)

## 2017-04-23 LAB — CBC
HEMATOCRIT: 34 % — AB (ref 35.0–47.0)
Hemoglobin: 11.3 g/dL — ABNORMAL LOW (ref 12.0–16.0)
MCH: 28 pg (ref 26.0–34.0)
MCHC: 33.3 g/dL (ref 32.0–36.0)
MCV: 84.2 fL (ref 80.0–100.0)
Platelets: 275 10*3/uL (ref 150–440)
RBC: 4.04 MIL/uL (ref 3.80–5.20)
RDW: 15.8 % — ABNORMAL HIGH (ref 11.5–14.5)
WBC: 11.1 10*3/uL — ABNORMAL HIGH (ref 3.6–11.0)

## 2017-04-23 NOTE — Evaluation (Signed)
Occupational Therapy Evaluation Patient Details Name: DARIONA POSTMA MRN: [id number] DOB: [birth year] Today's Date: 04/23/2017    History of Present Illness Pt underwent L TKR without reported post-op complications. She is POD#1 at time of initial evaluation. PMH includes depression, TIA, OA, SVT, GERD, headaches, HTN, and DM.    Clinical Impression   Pt is 66 year old female s/p L TKR.  She is limited in full evaluation due to pain 10/10 and when talking to NSG about pt wanting more pain medications she explained that pt did not take any pain meds last night and can received one more pain med now which will be given.  PT updated about status.  Pt unable to roll in bed or sit at EOB due to pain and is max assist for LB dressing and bathing and for toileting at this time.  Anticipate that once pain meds are consistently taken this level of assist will improve.  Pt instructed in use of AD with demonstration only due to this.  Rec she purchase a shower chair with back and/or grab bar in shower stall since her tub is a high garden tub.  Also rec she sleep in spare room twin bed since her master bed is very high and a stool is needed to get into bed which would be unsafe and not rec at this time.  Pt would benefit from instruction in dressing techniques with or without assistive devices for dressing and bathing skills.  Pt would also benefit from recommendations for home modifications to increase safety in the bathroom and prevent falls. Rec OT HH after discharge.     Follow Up Recommendations  Home health OT    Equipment Recommendations  Other (comment);Tub/shower seat (rec shower chair and/or grab bars in shower stall ; sleep in twin bed that is lower than her master bedroom bed that requires a stool  )    Recommendations for Other Services       Precautions / Restrictions Precautions Precautions: Fall;Knee Required Braces or Orthoses: Knee Immobilizer - Left Knee Immobilizer - Left:  Discontinue once straight leg raise with < 10 degree lag Restrictions Weight Bearing Restrictions: Yes LLE Weight Bearing: Weight bearing as tolerated      Mobility Bed Mobility                  Transfers                      Balance                                           ADL either performed or assessed with clinical judgement   ADL Overall ADL's : Needs assistance/impaired Eating/Feeding: Independent;Set up   Grooming: Wash/dry hands;Wash/dry face;Oral care;Applying deodorant;Brushing hair;Independent;Set up           Upper Body Dressing : Independent;Set up   Lower Body Dressing: Maximal assistance Lower Body Dressing Details (indicate cue type and reason): difficult to assess due to pain 10/10 and not able to participate in sitting EOB and is max assist at this time.  Antivipate this will improve greatly once pt consistently takes pain meds.               General ADL Comments: Difficult to assess due to pain 10/10 and not able to participate in sitting EOB and is max assist at  this time.  Antivipate this will improve greatly once pt consistently takes pain meds. She was ambulaying with SPC prior to surgery.  Strongly rec she use a shower chair and/or install grab bars in shower stall for safety wtih bathing and sleep in twin bed in separate room instead of using a stool to get into high bed that she shares with her husband.        Vision Baseline Vision/History: Wears glasses Wears Glasses: At all times Patient Visual Report: No change from baseline       Perception     Praxis      Pertinent Vitals/Pain Pain Assessment: 0-10 Pain Score: 10-Worst pain ever Pain Location: L knee Pain Descriptors / Indicators: Sore;Constant;Aching;Discomfort Pain Intervention(s): Limited activity within patient's tolerance;Monitored during session;Patient requesting pain meds-RN notified     Hand Dominance Right   Extremity/Trunk  Assessment Upper Extremity Assessment Upper Extremity Assessment: Overall WFL for tasks assessed   Lower Extremity Assessment Lower Extremity Assessment: Defer to PT evaluation       Communication Communication Communication: No difficulties   Cognition Arousal/Alertness: Awake/alert Behavior During Therapy: WFL for tasks assessed/performed Overall Cognitive Status: Within Functional Limits for tasks assessed                                     General Comments       Exercises     Shoulder Instructions      Home Living Family/patient expects to be discharged to:: Private residence Living Arrangements: Spouse/significant other Available Help at Discharge: Family;Available PRN/intermittently Type of Home: Mobile home Home Access: Stairs to enter Entrance Stairs-Number of Steps: 7 Entrance Stairs-Rails: Can reach both;Right;Left Home Layout: One level     Bathroom Shower/Tub: Chief Strategy Officer: Standard     Home Equipment: Cane - single point          Prior Functioning/Environment Level of Independence: Independent        Comments: Independent with ADLs/IADLs. Ambulating with a spc in the days leading up to the surgery. Reports 1 fall in the last 12 monts        OT Problem List: Decreased strength;Decreased range of motion;Decreased activity tolerance;Pain;Decreased knowledge of use of DME or AE      OT Treatment/Interventions: Self-care/ADL training;Patient/family education;DME and/or AE instruction    OT Goals(Current goals can be found in the care plan section) Acute Rehab OT Goals Patient Stated Goal: Return to prior function at home OT Goal Formulation: With patient Time For Goal Achievement: 05/07/17 Potential to Achieve Goals: Good ADL Goals Pt Will Perform Lower Body Dressing: with set-up;with min assist;with adaptive equipment;sit to/from stand (with no LOB, using FWW) Pt Will Transfer to Toilet: with set-up;with  min assist;bedside commode (BSC over toilet, no LOB) Pt Will Perform Toileting - Clothing Manipulation and hygiene: with set-up;with min assist;sit to/from stand  OT Frequency: Min 1X/week   Barriers to D/C:            Co-evaluation              AM-PAC PT "6 Clicks" Daily Activity     Outcome Measure Help from another person eating meals?: None Help from another person taking care of personal grooming?: None Help from another person toileting, which includes using toliet, bedpan, or urinal?: A Lot Help from another person bathing (including washing, rinsing, drying)?: A Lot Help from another person to put  on and taking off regular upper body clothing?: None Help from another person to put on and taking off regular lower body clothing?: A Lot 6 Click Score: 18   End of Session Nurse Communication: Patient requests pain meds  Activity Tolerance: Patient limited by pain Patient left: in bed;with call bell/phone within reach;with bed alarm set  OT Visit Diagnosis: Pain;Muscle weakness (generalized) (M62.81) Pain - Right/Left: Left Pain - part of body: Knee                Time: 0938-1829 OT Time Calculation (min): 25 min Charges:  OT General Charges $OT Visit: 1 Procedure OT Evaluation $OT Eval Moderate Complexity: 1 Procedure OT Treatments $Self Care/Home Management : 8-22 mins G-Codes: OT G-codes **NOT FOR INPATIENT CLASS** Functional Assessment Tool Used: AM-PAC 6 Clicks Daily Activity;Clinical judgement Functional Limitation: Self care Self Care Current Status (H3716): At least 80 percent but less than 100 percent impaired, limited or restricted Self Care Goal Status (R6789): At least 1 percent but less than 20 percent impaired, limited or restricted     Susanne Borders, OTR/L ascom [phone] 04/23/17, 9:57 AM

## 2017-04-23 NOTE — Progress Notes (Signed)
Patient is A&O x4. Up with assist x1 and WW. Jovita Gamma oral pains meds with relief. Blood sugars 296, 316, and 338. Tolerated diet, stating that the food "isn't cooked good". Post-op dressing still in place. CMTS are good. Encouraging IS. Bed alarm on for safety.

## 2017-04-23 NOTE — Care Management Note (Addendum)
Case Management Note  Patient Details  Name: Gina Tate MRN: [id number] Date of Birth: 07/04/1951  Subjective/Objective:  Attempted to speak with patient regarding discharge planning. She states PT is recommending SNF. She lives alone. Case discussed with PT. They are recommending SNF now. CSW made aware.                 Action/Plan: Following progression  Expected Discharge Date:  04/24/17               Expected Discharge Plan:  Skilled Nursing Facility  In-House Referral:  Clinical Social Work  Discharge planning Services  CM Consult  Post Acute Care Choice:    Choice offered to:     DME Arranged:    DME Agency:     HH Arranged:    HH Agency:     Status of Service:  In process, will continue to follow  If discussed at Long Length of Stay Meetings, dates discussed:    Additional Comments:  Marily Memos, RN 04/23/2017, 10:39 AM

## 2017-04-23 NOTE — Clinical Social Work Note (Signed)
CSW consulted for New SNF. PT is recommending HHPT. RNCM is following for discharge planning needs. CSW is signing off as no further needs identified.   Lashaunda Schild, MSW, LCSW Clinical Social Worker  [phone] 

## 2017-04-23 NOTE — Anesthesia Postprocedure Evaluation (Signed)
Anesthesia Post Note  Patient: Gina Tate  Procedure(s) Performed: Procedure(s) (LRB): TOTAL KNEE ARTHROPLASTY (Left)  Patient location during evaluation: Nursing Unit Anesthesia Type: Spinal Level of consciousness: oriented and awake and alert Pain management: pain level controlled Vital Signs Assessment: post-procedure vital signs reviewed and stable Respiratory status: spontaneous breathing, respiratory function stable and patient connected to nasal cannula oxygen Cardiovascular status: blood pressure returned to baseline and stable Postop Assessment: no headache and no backache Anesthetic complications: no     Last Vitals:  Vitals:   04/22/17 1546 04/22/17 1930  BP: 107/78 100/81  Pulse: 63 70  Resp:  19  Temp:  37.6 C  SpO2: 100% 99%    Last Pain:  Vitals:   04/23/17 0636  TempSrc:   PainSc: 9                  Tavonte Seybold,  Alessandra Bevels

## 2017-04-23 NOTE — Care Management Obs Status (Deleted)
MEDICARE OBSERVATION STATUS NOTIFICATION   Patient Details  Name: Gina Tate MRN: [id number] Date of Birth: [birth year]   Medicare Observation Status Notification Given:  Yes    Marily Memos, RN 04/23/2017, 8:56 AM

## 2017-04-23 NOTE — Progress Notes (Signed)
Physical Therapy Treatment Patient Details Name: Gina Tate MRN: [id number] DOB: 1951-06-01 Today's Date: 04/23/2017    History of Present Illness Pt underwent L TKR without post-op complications. PMH includes depression, TIA, OA, SVT, GERD, headaches, HTN, and DM.     PT Comments    Pt very pain limited and needed a lot of extra time between reps, sets, activities to calm and attempt to control pain.  She reports walking was actually less painful ("still 10/10") than the exercises.  She showed good effort but needed a lot of cuing/encouragement and struggled with all activities especially ROM.  Pt still wanting to try and go home, but per today's performance this would not be safe w/o significant improvement.    Follow Up Recommendations  SNF (hoping to be able to go home, needs significant improvement)     Equipment Recommendations  Rolling walker with 5" wheels    Recommendations for Other Services       Precautions / Restrictions Precautions Precautions: Fall;Knee Required Braces or Orthoses: Knee Immobilizer - Left Knee Immobilizer - Left: Discontinue once straight leg raise with < 10 degree lag Restrictions Weight Bearing Restrictions: Yes LLE Weight Bearing: Weight bearing as tolerated    Mobility  Bed Mobility Overal bed mobility: Needs Assistance Bed Mobility: Supine to Sit     Supine to sit: Mod assist     General bed mobility comments: Pt able to do some AROM sliding L LE to EOB, needs assist with this and considerable UE/HHA to get trunk upright  Transfers Overall transfer level: Needs assistance Equipment used: Rolling walker (2 wheeled) Transfers: Sit to/from Stand Sit to Stand: Max assist;Mod assist         General transfer comment: Pt attempted to get to upright using to different techniques before ultimately holding PTs hand and needing heavy assist to get to upright  Ambulation/Gait Ambulation/Gait assistance: Mod assist;Min  assist Ambulation Distance (Feet): 12 Feet Assistive device: Rolling walker (2 wheeled)       General Gait Details: Pt very hesistant with L LE WBing and was highly reliant on the walker though she did show good effort.  Pt's O2 stayed stable t/o the effort, HR increased from 70s to 100s.  Pt nauseated and feeling poorly (along with signficant pain) during the effort.    Stairs            Wheelchair Mobility    Modified Rankin (Stroke Patients Only)       Balance Overall balance assessment: Needs assistance Sitting-balance support: No upper extremity supported Sitting balance-Leahy Scale: Fair Sitting balance - Comments: Pt able to maintain balance, leaning away from L LE   Standing balance support: Bilateral upper extremity supported Standing balance-Leahy Scale: Fair Standing balance comment: highly reliant on the walker                            Cognition Arousal/Alertness: Awake/alert Behavior During Therapy: WFL for tasks assessed/performed Overall Cognitive Status: Within Functional Limits for tasks assessed                                        Exercises Total Joint Exercises Ankle Circles/Pumps: AROM;Both;10 reps;Supine Quad Sets: Strengthening;15 reps Gluteal Sets: Strengthening;15 reps Short Arc Quad: AAROM;AROM;10 reps Heel Slides: AAROM;10 reps Hip ABduction/ADduction: Strengthening;15 reps Straight Leg Raises: AAROM;10 reps Knee Flexion: PROM;5 reps  Goniometric ROM: 1-61    General Comments        Pertinent Vitals/Pain Pain Assessment: 0-10 Pain Score: 10-Worst pain ever Pain Location: L knee Pain Descriptors / Indicators: Sore;Constant;Aching;Discomfort Pain Intervention(s): Limited activity within patient's tolerance    Home Living Family/patient expects to be discharged to:: Private residence Living Arrangements: Spouse/significant other Available Help at Discharge: Family;Available  PRN/intermittently Type of Home: Mobile home Home Access: Stairs to enter Entrance Stairs-Rails: Can reach both;Right;Left Home Layout: One level Home Equipment: Cane - single point      Prior Function Level of Independence: Independent      Comments: Independent with ADLs/IADLs. Ambulating with a spc in the days leading up to the surgery. Reports 1 fall in the last 12 monts   PT Goals (current goals can now be found in the care plan section) Acute Rehab PT Goals Patient Stated Goal: Return to prior function at home Progress towards PT goals: Progressing toward goals    Frequency    BID      PT Plan Discharge plan needs to be updated    Co-evaluation              AM-PAC PT "6 Clicks" Daily Activity  Outcome Measure  Difficulty turning over in bed (including adjusting bedclothes, sheets and blankets)?: Unable Difficulty moving from lying on back to sitting on the side of the bed? : Unable Difficulty sitting down on and standing up from a chair with arms (e.g., wheelchair, bedside commode, etc,.)?: Unable Help needed moving to and from a bed to chair (including a wheelchair)?: Total Help needed walking in hospital room?: A Lot Help needed climbing 3-5 steps with a railing? : Total 6 Click Score: 7    End of Session Equipment Utilized During Treatment: Gait belt Activity Tolerance: Patient limited by pain Patient left: with chair alarm set;with call bell/phone within reach Nurse Communication: Patient requests pain meds;Mobility status PT Visit Diagnosis: Difficulty in walking, not elsewhere classified (R26.2);Muscle weakness (generalized) (M62.81);Pain Pain - Right/Left: Left Pain - part of body: Knee     Time: 1610-9604 PT Time Calculation (min) (ACUTE ONLY): 44 min  Charges:  $Gait Training: 8-22 mins $Therapeutic Exercise: 8-22 mins $Therapeutic Activity: 8-22 mins                    G Codes:       Malachi Pro, DPT 04/23/2017, 11:51 AM

## 2017-04-23 NOTE — Progress Notes (Signed)
Physical Therapy Treatment Patient Details Name: Gina Tate MRN: [id number] DOB: 13-Jun-1951 Today's Date: 04/23/2017    History of Present Illness Pt underwent L TKR without post-op complications. PMH includes depression, TIA, OA, SVT, GERD, headaches, HTN, and DM.     PT Comments    Pt continues to be very pain limited and hesitant with most acts, but actually did improve with mobility, strength and ability to ambulate.  She was still unable to do SLRs,  and is still frustrated with pain limitations.  She showed good effort t/o session but overall struggled to achieve typical POD1 goals.   Follow Up Recommendations  SNF     Equipment Recommendations  Rolling walker with 5" wheels    Recommendations for Other Services       Precautions / Restrictions Precautions Precautions: Fall;Knee Knee Immobilizer - Left: Discontinue once straight leg raise with < 10 degree lag Restrictions LLE Weight Bearing: Weight bearing as tolerated    Mobility  Bed Mobility Overal bed mobility: Needs Assistance Bed Mobility: Sit to Supine       Sit to supine: Mod assist;Min assist   General bed mobility comments: Pt was able to use momentum and help quite a bit with getting LEs up into bed, though she still did need assist  Transfers Overall transfer level: Needs assistance Equipment used: Rolling walker (2 wheeled) Transfers: Sit to/from Stand Sit to Stand: Min assist         General transfer comment: Pt was able to position her self better this afternoon, she needed considerably less help to rise this attempt vs this AM  Ambulation/Gait Ambulation/Gait assistance: Min assist Ambulation Distance (Feet): 40 Feet Assistive device: Rolling walker (2 wheeled)       General Gait Details: Pt again very pain limited/hesitant with ambulation initially, she did have increased confidence and decreased hesitancy the longer she was on her feet though she still was slow and labored.  Pt  did not have excessive fatigue or nausea/lightheadedness but would have struggled to do much more.    Stairs            Wheelchair Mobility    Modified Rankin (Stroke Patients Only)       Balance Overall balance assessment: Needs assistance Sitting-balance support: No upper extremity supported Sitting balance-Leahy Scale: Good Sitting balance - Comments: Pt feeling better and able to maintain upright   Standing balance support: Bilateral upper extremity supported Standing balance-Leahy Scale: Fair Standing balance comment: highly reliant on the walker                            Cognition Arousal/Alertness: Awake/alert Behavior During Therapy: WFL for tasks assessed/performed Overall Cognitive Status: Within Functional Limits for tasks assessed                                        Exercises Total Joint Exercises Ankle Circles/Pumps: AROM;Both;10 reps;Supine Quad Sets: Strengthening;15 reps Gluteal Sets: Strengthening;15 reps Short Arc Quad: AAROM;AROM;10 reps Heel Slides: AAROM;10 reps Hip ABduction/ADduction: Strengthening;15 reps Straight Leg Raises: AAROM;10 reps Knee Flexion: PROM;5 reps    General Comments        Pertinent Vitals/Pain Pain Score: 10-Worst pain ever Pain Location: L knee Pain Intervention(s): Limited activity within patient's tolerance    Home Living  Prior Function            PT Goals (current goals can now be found in the care plan section) Progress towards PT goals: Progressing toward goals    Frequency    BID      PT Plan Current plan remains appropriate    Co-evaluation              AM-PAC PT "6 Clicks" Daily Activity  Outcome Measure  Difficulty turning over in bed (including adjusting bedclothes, sheets and blankets)?: A Lot Difficulty moving from lying on back to sitting on the side of the bed? : Unable Difficulty sitting down on and standing up  from a chair with arms (e.g., wheelchair, bedside commode, etc,.)?: A Lot       6 Click Score: 5    End of Session Equipment Utilized During Treatment: Gait belt Activity Tolerance: Patient limited by pain Patient left: with chair alarm set;with bed alarm set   PT Visit Diagnosis: Difficulty in walking, not elsewhere classified (R26.2);Muscle weakness (generalized) (M62.81);Pain Pain - Right/Left: Left Pain - part of body: Knee     Time: 1335-1417 PT Time Calculation (min) (ACUTE ONLY): 42 min  Charges:  $Gait Training: 8-22 mins $Therapeutic Exercise: 8-22 mins $Therapeutic Activity: 8-22 mins                    G Codes:       Malachi Pro, DPT 04/23/2017, 3:45 PM

## 2017-04-23 NOTE — Progress Notes (Signed)
   Subjective: 1 Day Post-Op Procedure(s) (LRB): TOTAL KNEE ARTHROPLASTY (Left) Patient reports pain as 10 on 0-10 scale. Not maximizing pain meds. Patient is well, and has had no acute complaints or problems Denies any CP, SOB, ABD pain. We will continue therapy today.  Plan is to go Home after hospital stay.  Objective: Vital signs in last 24 hours: Temp:  [97.2 F (36.2 C)-99.7 F (37.6 C)] 99.7 F (37.6 C) (08/23 1930) Pulse Rate:  [50-78] 70 (08/23 1930) Resp:  [9-19] 19 (08/23 1930) BP: (87-141)/(50-81) 100/81 (08/23 1930) SpO2:  [94 %-100 %] 99 % (08/23 1930) Weight:  [113.9 kg (251 lb)] 113.9 kg (251 lb) (08/23 1110)  Intake/Output from previous day: 08/23 0701 - 08/24 0700 In: 2660 [P.O.:1560; I.V.:1100] Out: 2365 [Urine:2340; Blood:25] Intake/Output this shift: No intake/output data recorded.   Recent Labs  04/22/17 1130 04/23/17 0257  HGB 11.5* 11.3*    Recent Labs  04/22/17 1130 04/23/17 0257  WBC 8.8 11.1*  RBC 4.18 4.04  HCT 35.0 34.0*  PLT 280 275    Recent Labs  04/22/17 1130 04/23/17 0257  NA  --  132*  K  --  4.4  CL  --  98*  CO2  --  26  BUN  --  7  CREATININE 0.59 0.59  GLUCOSE  --  285*  CALCIUM  --  8.2*   No results for input(s): LABPT, INR in the last 72 hours.  EXAM General - Patient is Alert, Appropriate and Oriented Extremity - Neurovascular intact Sensation intact distally Intact pulses distally Dorsiflexion/Plantar flexion intact No cellulitis present Compartment soft Dressing - dressing C/D/I and no drainage Motor Function - intact, moving foot and toes well on exam.   Past Medical History:  Diagnosis Date  . Dementia   . Depression   . Diabetes mellitus, type II (HCC)   . Dyspnea   . Dysrhythmia   . GERD (gastroesophageal reflux disease)   . Headache   . History of hiatal hernia   . Hypercholesteremia   . Hypertension   . Hypothyroidism   . Thyroid disease     Assessment/Plan:   1 Day Post-Op  Procedure(s) (LRB): TOTAL KNEE ARTHROPLASTY (Left) Active Problems:   Primary localized osteoarthritis of left knee  Estimated body mass index is 41.77 kg/m as calculated from the following:   Height as of this encounter: 5\' 5"  (1.651 m).   Weight as of this encounter: 113.9 kg (251 lb). Advance diet Up with therapy  Needs BM Encouraged incentive spirometer Hgb stable 11.3, recheck labs in the am Encouraged patient to stay ahead of pain with muscle relaxer and narcotic CM to assist with discharge   DVT Prophylaxis - Lovenox, Foot Pumps and TED hose Weight-Bearing as tolerated to left leg   T. Cranston Neighbor, PA-C [facility] 04/23/2017, 7:39 AM

## 2017-04-24 LAB — GLUCOSE, CAPILLARY
GLUCOSE-CAPILLARY: 288 mg/dL — AB (ref 65–99)
GLUCOSE-CAPILLARY: 220 mg/dL — AB (ref 65–99)
GLUCOSE-CAPILLARY: 157 mg/dL — AB (ref 65–99)
Glucose-Capillary: 271 mg/dL — ABNORMAL HIGH (ref 65–99)

## 2017-04-24 LAB — CBC
HEMATOCRIT: 33.2 % — AB (ref 35.0–47.0)
Hemoglobin: 11 g/dL — ABNORMAL LOW (ref 12.0–16.0)
MCH: 27.9 pg (ref 26.0–34.0)
MCHC: 33.3 g/dL (ref 32.0–36.0)
MCV: 83.8 fL (ref 80.0–100.0)
PLATELETS: 295 10*3/uL (ref 150–440)
RBC: 3.96 MIL/uL (ref 3.80–5.20)
RDW: 15.5 % — ABNORMAL HIGH (ref 11.5–14.5)
WBC: 13.6 10*3/uL — AB (ref 3.6–11.0)

## 2017-04-24 LAB — BASIC METABOLIC PANEL
Anion gap: 7 (ref 5–15)
BUN: 10 mg/dL (ref 6–20)
CHLORIDE: 99 mmol/L — AB (ref 101–111)
CO2: 27 mmol/L (ref 22–32)
Calcium: 8.7 mg/dL — ABNORMAL LOW (ref 8.9–10.3)
Creatinine, Ser: 0.61 mg/dL (ref 0.44–1.00)
GFR calc Af Amer: >60 mL/min (ref >60)
GLUCOSE: 273 mg/dL — AB (ref 65–99)
POTASSIUM: 4.7 mmol/L (ref 3.5–5.1)
Sodium: 133 mmol/L — ABNORMAL LOW (ref 135–145)

## 2017-04-24 NOTE — Progress Notes (Signed)
Physical Therapy Treatment Patient Details Name: Gina Tate MRN: [id number] DOB: 02-Apr-1951 Today's Date: 04/24/2017    History of Present Illness Pt underwent L TKR without post-op complications. PMH includes depression, TIA, OA, SVT, GERD, headaches, HTN, and DM.     PT Comments    BP remains low this am.  Nursing attempting IV fluids to address. Session limited to supine exercises until fluids are received to increase BP.  Participated in exercises as described below.  Pt with general pain and anxiety with movements limiting ROM in supine but does well with slow movements and encouragement.  Family in attendance for session and help to encourage patient.    Pt does have some edema on LLE lateral leg/calf.  She does not have pain with dorsiflexion.  Skin is somewhat warmer to touch but it is below polar care pad where skin is cooler.  Some tenderness but pt is tender to general touch throughout LE.  Primary nurse to check.   Continue to monitor.   Follow Up Recommendations  SNF     Equipment Recommendations  Rolling walker with 5" wheels    Recommendations for Other Services       Precautions / Restrictions Precautions Precautions: Fall;Knee Required Braces or Orthoses: Knee Immobilizer - Left Knee Immobilizer - Left: Discontinue once straight leg raise with < 10 degree lag Restrictions Weight Bearing Restrictions: Yes RLE Weight Bearing: Weight bearing as tolerated LLE Weight Bearing: Weight bearing as tolerated    Mobility  Bed Mobility Overal bed mobility: Needs Assistance             General bed mobility comments: pt able to scoot up in bed with bed flat and use of R bed rail with no physical assist required  Transfers                 General transfer comment: deferred - pt declined due to not feeling well/tired  Ambulation/Gait                 Stairs            Wheelchair Mobility    Modified Rankin (Stroke Patients Only)        Balance                                            Cognition Arousal/Alertness: Awake/alert Behavior During Therapy: WFL for tasks assessed/performed Overall Cognitive Status: Within Functional Limits for tasks assessed                                        Exercises Total Joint Exercises Ankle Circles/Pumps: AROM;Both;10 reps;Supine Quad Sets: Strengthening;15 reps Gluteal Sets: Strengthening;15 reps Short Arc Quad: AAROM;AROM;10 reps Heel Slides: AAROM;10 reps Hip ABduction/ADduction: Strengthening;15 reps Straight Leg Raises: AAROM;10 reps Knee Flexion: PROM;10 reps Goniometric ROM: Did not measure this session due to supine position.  Will await PM session where it hopefully can be measured in sitting. Other Exercises Other Exercises: pt educated in home/routines modifications and DME/AE to maximize safety and functional independence in the home, pt verbalized understanding.    General Comments        Pertinent Vitals/Pain Pain Assessment: 0-10 Pain Score: 6  Pain Location: L knee Pain Descriptors / Indicators: Sore;Constant;Aching;Discomfort;Operative site guarding Pain Intervention(s): Limited  activity within patient's tolerance;Monitored during session;Ice applied    Home Living                      Prior Function            PT Goals (current goals can now be found in the care plan section) Acute Rehab PT Goals Patient Stated Goal: Return to prior function at home Progress towards PT goals: Progressing toward goals    Frequency    BID      PT Plan Current plan remains appropriate    Co-evaluation              AM-PAC PT "6 Clicks" Daily Activity  Outcome Measure  Difficulty turning over in bed (including adjusting bedclothes, sheets and blankets)?: A Lot Difficulty moving from lying on back to sitting on the side of the bed? : Unable Difficulty sitting down on and standing up from a chair  with arms (e.g., wheelchair, bedside commode, etc,.)?: A Lot Help needed moving to and from a bed to chair (including a wheelchair)?: Total Help needed walking in hospital room?: A Lot Help needed climbing 3-5 steps with a railing? : Total 6 Click Score: 9    End of Session   Activity Tolerance: Patient limited by pain;Treatment limited secondary to medical complications (Comment) Patient left: in bed;with bed alarm set;with call bell/phone within reach;with family/visitor present Nurse Communication: Other (comment) Pain - Right/Left: Left Pain - part of body: Knee     Time: 3716-9678 PT Time Calculation (min) (ACUTE ONLY): 34 min  Charges:  $Therapeutic Exercise: 23-37 mins                    G Codes:       Danielle Dess, PTA 04/24/17, 11:43 AM

## 2017-04-24 NOTE — Progress Notes (Signed)
Occupational Therapy Treatment Patient Details Name: Gina Tate MRN: [id number] DOB: October 12, 1950 Today's Date: 04/24/2017    History of present illness Pt underwent L TKR without post-op complications. PMH includes depression, TIA, OA, SVT, GERD, headaches, HTN, and DM.    OT comments  Pt seen for OT treatment this date. Initially declining due to fatigue, although reporting she slept well last night, and reports having low BP this am. BP checked at start of session in supine 125/57, pain in L knee 2/10. Pt agreeable to bed level treatment session focused on education/training in AE/DME for self care tasks and home/routines modifications to maximize safety and functional independence in the home. Pt currently limited by pain which reaches "10/10 if I move it", low BP, and fatigue limiting out of bed activity and ability to progress pt towards goals of returning home after this hospitalization. Based on pt's current status, recommend transition to STR prior to return home to maximize safety and recovery.   Follow Up Recommendations  SNF    Equipment Recommendations       Recommendations for Other Services      Precautions / Restrictions Precautions Precautions: Fall;Knee Required Braces or Orthoses: Knee Immobilizer - Left Knee Immobilizer - Left: Discontinue once straight leg raise with < 10 degree lag Restrictions Weight Bearing Restrictions: Yes RLE Weight Bearing: Weight bearing as tolerated LLE Weight Bearing: Weight bearing as tolerated       Mobility Bed Mobility Overal bed mobility: Needs Assistance             General bed mobility comments: pt able to scoot up in bed with bed flat and use of R bed rail with no physical assist required  Transfers                 General transfer comment: deferred - pt declined due to not feeling well/tired    Balance                                           ADL either performed or assessed with  clinical judgement   ADL Overall ADL's : Needs assistance/impaired                       Lower Body Dressing Details (indicate cue type and reason): pt declined trialing use of AE this date for dressing tasks due to pain and fatigue                     Vision Baseline Vision/History: Wears glasses Wears Glasses: At all times Patient Visual Report: No change from baseline     Perception     Praxis      Cognition Arousal/Alertness: Awake/alert Behavior During Therapy: WFL for tasks assessed/performed Overall Cognitive Status: Within Functional Limits for tasks assessed                                          Exercises Other Exercises Other Exercises: pt educated in home/routines modifications and DME/AE to maximize safety and functional independence in the home, pt verbalized understanding.   Shoulder Instructions       General Comments      Pertinent Vitals/ Pain       Pain Assessment: 0-10 Pain Score: 2  Pain Location: L knee Pain Descriptors / Indicators: Sore;Constant;Aching;Discomfort Pain Intervention(s): Limited activity within patient's tolerance;Monitored during session;Repositioned;Ice applied  Home Living                                          Prior Functioning/Environment              Frequency  Min 1X/week        Progress Toward Goals  OT Goals(current goals can now be found in the care plan section)  Progress towards OT goals: Not progressing toward goals - comment (pt currently limited by low BP, pain, fatigue)  Acute Rehab OT Goals Patient Stated Goal: Return to prior function at home OT Goal Formulation: With patient Time For Goal Achievement: 05/07/17 Potential to Achieve Goals: Good  Plan Discharge plan needs to be updated;Frequency remains appropriate    Co-evaluation                 AM-PAC PT "6 Clicks" Daily Activity     Outcome Measure   Help from another  person eating meals?: None Help from another person taking care of personal grooming?: None Help from another person toileting, which includes using toliet, bedpan, or urinal?: A Lot Help from another person bathing (including washing, rinsing, drying)?: A Lot Help from another person to put on and taking off regular upper body clothing?: None Help from another person to put on and taking off regular lower body clothing?: A Lot 6 Click Score: 18    End of Session    OT Visit Diagnosis: Pain;Muscle weakness (generalized) (M62.81) Pain - Right/Left: Left Pain - part of body: Knee   Activity Tolerance Patient limited by fatigue   Patient Left in bed;with call bell/phone within reach;with bed alarm set;Other (comment) (polar care in place, bone foam removed per pt request, rolled towel under L ankle in place)   Nurse Communication Other (comment) (status of IV line)    Functional Assessment Tool Used: AM-PAC 6 Clicks Daily Activity;Clinical judgement Functional Limitation: Self care Self Care Current Status (F2902): At least 80 percent but less than 100 percent impaired, limited or restricted Self Care Goal Status (X1155): At least 20 percent but less than 40 percent impaired, limited or restricted   Time: 2080-2233 OT Time Calculation (min): 19 min  Charges: OT G-codes **NOT FOR INPATIENT CLASS** Functional Assessment Tool Used: AM-PAC 6 Clicks Daily Activity;Clinical judgement Functional Limitation: Self care Self Care Current Status (K1224): At least 80 percent but less than 100 percent impaired, limited or restricted Self Care Goal Status (S9753): At least 20 percent but less than 40 percent impaired, limited or restricted OT General Charges $OT Visit: 1 Procedure OT Treatments $Self Care/Home Management : 8-22 mins  Richrd Prime, MPH, MS, OTR/L ascom [phone] 04/24/17, 9:53 AM

## 2017-04-24 NOTE — Progress Notes (Signed)
Patient BP 97/46. PA aware and ordered for RN to restart fluids and hold BP medications. Patient not dizzy at this time but said she thought she was going to faint when she got up to the [facility] a couple of hours ago. Will continue to monitor.   Harvie Heck, RN

## 2017-04-24 NOTE — Progress Notes (Signed)
Physical Therapy Treatment Patient Details Name: Gina Tate MRN: [id number] DOB: [birth year] Today's Date: 04/24/2017    History of Present Illness Pt underwent L TKR without post-op complications. PMH includes depression, TIA, OA, SVT, GERD, headaches, HTN, and DM.     PT Comments    Pt continues to be very pain limited, worried about over doing it and needing a lot of encouragement and cuing. She is still unable to do SLRs, is very painful and stiff with ROM (to 65deg with great effort and discomfort) and has not been able to walk out of the room.  Pt is willing to participate but pain quickly becomes a limiter with most acts.  Pt states she will be able to do much better once pain and swelling calm down, but that it's too much right now.     Follow Up Recommendations  SNF     Equipment Recommendations  Rolling walker with 5" wheels    Recommendations for Other Services       Precautions / Restrictions Precautions Precautions: Fall;Knee Precaution Booklet Issued: Yes (comment) Restrictions LLE Weight Bearing: Weight bearing as tolerated    Mobility  Bed Mobility Overal bed mobility: Needs Assistance Bed Mobility: Sit to Supine     Supine to sit: Min assist     General bed mobility comments: Pt able to use R LE to get L to the edge, needs PT assist to protect L LE as it comes off the bed  Transfers Overall transfer level: Modified independent Equipment used: Rolling walker (2 wheeled) Transfers: Sit to/from Stand Sit to Stand: Min assist;Min guard         General transfer comment: Pt needed extra time in sitting to feel well enough to stand (BP was low, but not a limiter: 110s/60s)  Ambulation/Gait Ambulation/Gait assistance: Min assist Ambulation Distance (Feet): 20 Feet Assistive device: Rolling walker (2 wheeled)       General Gait Details: Pt's first few steps were very labored an guarded, difficult advancing L LE.  Pt was able to improve cadence  minimally with more steps but could not increase distance and is not able to attain typical POD2 milestones   Stairs            Wheelchair Mobility    Modified Rankin (Stroke Patients Only)       Balance Overall balance assessment: Needs assistance Sitting-balance support: No upper extremity supported Sitting balance-Leahy Scale: Good Sitting balance - Comments: Pt feeling better and able to maintain upright   Standing balance support: Bilateral upper extremity supported Standing balance-Leahy Scale: Fair Standing balance comment: highly reliant on the walker                            Cognition Arousal/Alertness: Awake/alert Behavior During Therapy: WFL for tasks assessed/performed Overall Cognitive Status: Within Functional Limits for tasks assessed                                        Exercises Total Joint Exercises Ankle Circles/Pumps: AROM;Both;10 reps;Supine Quad Sets: Strengthening;15 reps Gluteal Sets: Strengthening;15 reps Short Arc Quad: AROM;AAROM;5 reps (pt calling out in pain, unable to do full set) Heel Slides: PROM;AAROM;5 reps Hip ABduction/ADduction: Strengthening;15 reps Straight Leg Raises: PROM;10 reps;AAROM Knee Flexion: PROM;5 reps Goniometric ROM: 2-65 (c/o considerable pain, heavy encouragement)    General Comments  Pertinent Vitals/Pain Pain Assessment: 0-10 Pain Score: 10-Worst pain ever (pt reports minimal pain at rest)    Home Living                      Prior Function            PT Goals (current goals can now be found in the care plan section) Progress towards PT goals:  (slow preogression)    Frequency    BID      PT Plan Current plan remains appropriate    Co-evaluation              AM-PAC PT "6 Clicks" Daily Activity  Outcome Measure  Difficulty turning over in bed (including adjusting bedclothes, sheets and blankets)?: A Lot Difficulty moving from lying  on back to sitting on the side of the bed? : Unable Difficulty sitting down on and standing up from a chair with arms (e.g., wheelchair, bedside commode, etc,.)?: Unable Help needed moving to and from a bed to chair (including a wheelchair)?: A Lot Help needed walking in hospital room?: A Lot Help needed climbing 3-5 steps with a railing? : Total 6 Click Score: 9    End of Session Equipment Utilized During Treatment: Gait belt Activity Tolerance: Patient limited by pain Patient left: with chair alarm set;with call bell/phone within reach;with family/visitor present Nurse Communication: Mobility status PT Visit Diagnosis: Difficulty in walking, not elsewhere classified (R26.2);Muscle weakness (generalized) (M62.81);Pain Pain - Right/Left: Left Pain - part of body: Knee     Time: 5621-3086 PT Time Calculation (min) (ACUTE ONLY): 41 min  Charges:  $Gait Training: 8-22 mins $Therapeutic Exercise: 8-22 mins $Therapeutic Activity: 8-22 mins                    G Codes:       Malachi Pro, DPT 04/24/2017, 5:37 PM

## 2017-04-24 NOTE — Progress Notes (Signed)
Patient complaining of tenderness and swelling of left lateral calf. Feels warm to touch. Dr. Rosita Kea notified and will be in shortly to assess.   Harvie Heck, RN

## 2017-04-24 NOTE — Progress Notes (Addendum)
   Subjective: 2 Days Post-Op Procedure(s) (LRB): TOTAL KNEE ARTHROPLASTY (Left) Patient reports pain as moderate. Patient is well, but has had some minor complaints of Dizziness with standing today. BP low, BP improved with lying in bed.  Denies any CP, SOB, ABD pain. We will continue therapy today.  Plan is to go Home after hospital stay.  Objective: Vital signs in last 24 hours: Temp:  [98.4 F (36.9 C)] 98.4 F (36.9 C) (08/24 2016) Pulse Rate:  [67-101] 67 (08/25 0653) Resp:  [16-18] 18 (08/24 2016) BP: (84-152)/(56-69) 84/68 (08/25 0653) SpO2:  [100 %] 100 % (08/24 2016)  Intake/Output from previous day: 08/24 0701 - 08/25 0700 In: 960 [P.O.:960] Out: 700 [Urine:700] Intake/Output this shift: No intake/output data recorded.   Recent Labs  04/22/17 1130 04/23/17 0257 04/24/17 0316  HGB 11.5* 11.3* 11.0*    Recent Labs  04/23/17 0257 04/24/17 0316  WBC 11.1* 13.6*  RBC 4.04 3.96  HCT 34.0* 33.2*  PLT 275 295    Recent Labs  04/23/17 0257 04/24/17 0316  NA 132* 133*  K 4.4 4.7  CL 98* 99*  CO2 26 27  BUN 7 10  CREATININE 0.59 0.61  GLUCOSE 285* 273*  CALCIUM 8.2* 8.7*   No results for input(s): LABPT, INR in the last 72 hours.  EXAM General - Patient is Alert, Appropriate and Oriented Extremity - Neurovascular intact Sensation intact distally Intact pulses distally Dorsiflexion/Plantar flexion intact No cellulitis present Compartment soft Dressing - dressing C/D/I and no drainage, new dressing applied Motor Function - intact, moving foot and toes well on exam.   Past Medical History:  Diagnosis Date  . Dementia   . Depression   . Diabetes mellitus, type II (HCC)   . Dyspnea   . Dysrhythmia   . GERD (gastroesophageal reflux disease)   . Headache   . History of hiatal hernia   . Hypercholesteremia   . Hypertension   . Hypothyroidism   . Thyroid disease     Assessment/Plan:   2 Days Post-Op Procedure(s) (LRB): TOTAL KNEE  ARTHROPLASTY (Left) Active Problems:   Primary localized osteoarthritis of left knee   Hypotension  Estimated body mass index is 41.77 kg/m as calculated from the following:   Height as of this encounter: 5\' 5"  (1.651 m).   Weight as of this encounter: 113.9 kg (251 lb). Advance diet Up with therapy  Needs BM Encouraged incentive spirometer Labs stable, Hgb 11.0 Orthostatic Hypotension - Hold BP meds, give 1 liter of NS. Monitor BP.  CM to assist with discharge   DVT Prophylaxis - Lovenox, Foot Pumps and TED hose Weight-Bearing as tolerated to left leg   T. Cranston Neighbor, PA-C [facility] 04/24/2017, 8:18 AM

## 2017-04-24 NOTE — Clinical Social Work Note (Signed)
CSW met with the patient and her husband at bedside to provide bed offers. The patient chose [facility]. The CSW confirmed with Sharyn Lull (admission coordinator) that the patient can discharge tomorrow. Sharyn Lull agreed. CSW updated the patient, and she requested EMS to transport. CSW will facilitate discharge once she is cleared in the morning.   Santiago Bumpers, MSW, Latanya Presser [phone]

## 2017-04-24 NOTE — Clinical Social Work Note (Signed)
Clinical Social Work Assessment  Patient Details  Name: Gina Tate MRN: [id number] Date of Birth: 1950-11-25  Date of referral:  04/24/17               Reason for consult:  Facility Placement                Permission sought to share information with:  Chartered certified accountant granted to share information::  Yes, Verbal Permission Granted  Name::        Agency::     Relationship::     Contact Information:     Housing/Transportation Living arrangements for the past 2 months:  Single Family Home Source of Information:  Patient, Medical Team Patient Interpreter Needed:  None Criminal Activity/Legal Involvement Pertinent to Current Situation/Hospitalization:  No - Comment as needed Significant Relationships:  Adult Children, Spouse Lives with:  Spouse Do you feel safe going back to the place where you live?  Yes Need for family participation in patient care:  No (Coment)  Care giving concerns: PT recommendation for STR   Social Worker assessment / plan:  CSW met with the patient and her spouse to discuss change in PT recommendation to SNF rather than HHPT. The patient indicated that she is willing to pursue STR, and she named [facility] as her preference. The patient lives with her spouse, and he is in agreement with the plan. CSW will revisit with bed offers as they are available.  Employment status:  Retired Forensic scientist:  Medicare PT Recommendations:  [place] / Referral to community resources:  [place]  Patient/Family's Response to care:  Patient and her husband thanked the CSW for assistance. Patient/Family's Understanding of and Emotional Response to Diagnosis, Current Treatment, and Prognosis:  The patient and her husband understand the change is disposition and are in agreement with the new discharge plan.  Emotional Assessment Appearance:  Appears stated age Attitude/Demeanor/Rapport:   Lethargic Affect (typically observed):  Accepting, Appropriate, Pleasant Orientation:  Oriented to Self, Oriented to Place, Oriented to  Time, Oriented to Situation Alcohol / Substance use:  Never Used Psych involvement (Current and /or in the community):  No (Comment)  Discharge Needs  Concerns to be addressed:  Care Coordination, Discharge Planning Concerns Readmission within the last 30 days:  No Current discharge risk:  None Barriers to Discharge:  Continued Medical Work up   [employer], LCSW 04/24/2017, 3:17 PM

## 2017-04-25 ENCOUNTER — Encounter
Admission: RE | Admit: 2017-04-25 | Discharge: 2017-04-25 | Disposition: A | Discharge status: Home / Self Care | Payer: Medicare Other | Source: Ambulatory Visit | Attending: Internal Medicine | Admitting: Internal Medicine

## 2017-04-25 LAB — GLUCOSE, CAPILLARY
Glucose-Capillary: 218 mg/dL — ABNORMAL HIGH (ref 65–99)
Glucose-Capillary: 162 mg/dL — ABNORMAL HIGH (ref 65–99)

## 2017-04-25 MED ORDER — MAGNESIUM HYDROXIDE 400 MG/5ML PO SUSP: 30.0000 mL | Freq: Every day | ORAL | 0 refills | Status: DC | PRN

## 2017-04-25 MED ORDER — ENOXAPARIN SODIUM 40 MG/0.4ML ~~LOC~~ SOLN: 40.0000 mg | SUBCUTANEOUS | 0 refills | Status: DC

## 2017-04-25 MED ORDER — OXYCODONE HCL 5 MG PO TABS: 5.0000 mg | ORAL_TABLET | ORAL | 0 refills | Status: DC | PRN

## 2017-04-25 NOTE — Clinical Social Work Note (Signed)
Patient will discharge to [facility] Room 202[facility]A today via non-emergent EMS. The patient and her family are aware as is the facility, and all are in agreement. All documentation has been received by the facility, and the discharge packet has been delivered to the chart. CSW will continue to follow pending additional discharge needs.  Argentina Ponder, MSW, Theresia Majors [phone]

## 2017-04-25 NOTE — Discharge Summary (Addendum)
Physician Discharge Summary  Patient ID: Gina Tate MRN: [id number] DOB/AGE: [birth year] 66 y.o.  Admit date: 04/22/2017 Discharge date: 04/25/2017  Admission Diagnoses:  PRIMARY OSTEOARTHRITIS LEFT KNEE   Discharge Diagnoses: Patient Active Problem List   Diagnosis Date Noted  . Primary localized osteoarthritis of left knee 04/22/2017  . Clinical depression 03/11/2015  . Type 2 diabetes mellitus treated with insulin (HCC) 06/04/2014  . Adult BMI 30+ 06/04/2014  . Difficulty in walking 03/05/2014  . Amnesia 03/05/2014  . Adiposity 03/05/2014  . Disordered sleep 03/05/2014  . Acquired hypothyroidism 12/07/2013  . Absolute anemia 12/07/2013  . Benign essential HTN 12/07/2013  . Arthritis, degenerative 12/07/2013  . Pure hypercholesterolemia 12/07/2013  . Type 2 diabetes mellitus (HCC) 12/07/2013  . Diabetes mellitus (HCC) 12/07/2013    Past Medical History:  Diagnosis Date  . Dementia   . Depression   . Diabetes mellitus, type II (HCC)   . Dyspnea   . Dysrhythmia   . GERD (gastroesophageal reflux disease)   . Headache   . History of hiatal hernia   . Hypercholesteremia   . Hypertension   . Hypothyroidism   . Thyroid disease      Transfusion: none   Consultants (if any):   Discharged Condition: Improved  Hospital Course: Gina Tate is an 66 y.o. female who was admitted 04/22/2017 with a diagnosis of <principal problem not specified> and went to the operating room on 04/22/2017 and underwent the above named procedures.    Surgeries: Procedure(s): TOTAL KNEE ARTHROPLASTY on 04/22/2017 Patient tolerated the surgery well. Taken to PACU where she was stabilized and then transferred to the orthopedic floor.  Started on Lovenox 40 q 24 hrs. Foot pumps applied bilaterally at 80 mm. Heels elevated on bed with rolled towels. No evidence of DVT. Negative Homan. Physical therapy started on day #1 for gait training and transfer. OT started day #1 for ADL and assisted  devices.  Patient's foley was d/c on day #1. On post op day 2, patient with hypotension, BP meds held and 1 liter bolus of NS given. BP improved, patient able to tolerate afternoon PT well.  Patient's IV  was d/c on day #2.  On post op day #3 patient was stable and ready for discharge to SNF.  Implants: Medacta GMK sphere left 3+ femur, 3 tibia was short stem, 13 mm tibial insert, to patella all components cemented  She was given perioperative antibiotics:  Anti-infectives    Start     Dose/Rate Route Frequency Ordered Stop   04/22/17 1600  ceFAZolin (ANCEF) IVPB 2g/100 mL premix     2 g 200 mL/hr over 30 Minutes Intravenous Every 6 hours 04/22/17 1058 04/23/17 0452   04/22/17 0604  ceFAZolin (ANCEF) 2-4 GM/100ML-% IVPB    Comments:  Agnes Lawrence  : cabinet override      04/22/17 0604 04/22/17 0746   04/21/17 2145  ceFAZolin (ANCEF) IVPB 2g/100 mL premix     2 g 200 mL/hr over 30 Minutes Intravenous  Once 04/21/17 2140 04/22/17 0801    .  She was given sequential compression devices, early ambulation, and Lovenox for DVT prophylaxis.  She benefited maximally from the hospital stay and there were no complications.    Recent vital signs:  Vitals:   04/24/17 1937 04/25/17 0724  BP: 122/68 126/61  Pulse: 82 80  Resp: 18 18  Temp: 99.3 F (37.4 C) 98.8 F (37.1 C)  SpO2: 98% 98%    Recent laboratory studies:  Lab Results  Component Value Date   HGB 11.0 (L) 04/24/2017   HGB 11.3 (L) 04/23/2017   HGB 11.5 (L) 04/22/2017   Lab Results  Component Value Date   WBC 13.6 (H) 04/24/2017   PLT 295 04/24/2017   Lab Results  Component Value Date   INR 1.05 04/15/2017   Lab Results  Component Value Date   NA 133 (L) 04/24/2017   K 4.7 04/24/2017   CL 99 (L) 04/24/2017   CO2 27 04/24/2017   BUN 10 04/24/2017   CREATININE 0.61 04/24/2017   GLUCOSE 273 (H) 04/24/2017    Discharge Medications:   Allergies as of 04/25/2017   No Known Allergies     Medication  List    TAKE these medications   amLODipine 10 MG tablet Commonly known as:  NORVASC Take 10 mg by mouth daily.   aspirin EC 81 MG tablet Take 81 mg by mouth daily.   atenolol 25 MG tablet Commonly known as:  TENORMIN Take 25 mg by mouth every evening.   atorvastatin 10 MG tablet Commonly known as:  LIPITOR Take 10 mg by mouth daily at 6 PM.   CALCIUM/VITAMIN D3 GUMMIES PO Take 1 each by mouth 3 (three) times daily.   docusate sodium 100 MG capsule Commonly known as:  COLACE Take 100 mg by mouth daily.   enoxaparin 40 MG/0.4ML injection Commonly known as:  LOVENOX Inject 0.4 mLs (40 mg total) into the skin daily.   ferrous sulfate 325 (65 FE) MG tablet Take 325 mg by mouth daily with breakfast.   insulin NPH-regular Human (70-30) 100 UNIT/ML injection Commonly known as:  NOVOLIN 70/30 Inject 35-40 Units into the skin 2 (two) times daily with a meal. If blood sugar is 100- 120, 35 units, and  If above 150, it's 40 units   lansoprazole 15 MG capsule Commonly known as:  PREVACID Take 15 mg by mouth 2 (two) times daily before a meal.   levothyroxine 137 MCG tablet Commonly known as:  SYNTHROID, LEVOTHROID Take 137 mcg by mouth daily before breakfast.   losartan 100 MG tablet Commonly known as:  COZAAR Take 100 mg by mouth daily.   magnesium hydroxide 400 MG/5ML suspension Commonly known as:  MILK OF MAGNESIA Take 30 mLs by mouth daily as needed for mild constipation.   Melatonin 5 MG Tabs Take 1 tablet by mouth at bedtime.   metFORMIN 1000 MG tablet Commonly known as:  GLUCOPHAGE Take 1,000 mg by mouth 2 (two) times daily with a meal.   nortriptyline 10 MG capsule Commonly known as:  PAMELOR Take 1 capsule (10 mg total) by mouth at bedtime.   oxyCODONE 5 MG immediate release tablet Commonly known as:  Oxy IR/ROXICODONE Take 1-2 tablets (5-10 mg total) by mouth every 4 (four) hours as needed for breakthrough pain.   RA VITAMIN B-12 TR 1000 MCG  Tbcr Generic drug:  Cyanocobalamin Take 1 tablet by mouth 2 (two) times daily.   venlafaxine 75 MG tablet Commonly known as:  EFFEXOR Take 1 tablet (75 mg total) by mouth daily.            Durable Medical Equipment        Start     Ordered   04/22/17 1059  DME Walker rolling  Once    Question:  Patient needs a walker to treat with the following condition  Answer:  Status post total knee replacement   04/22/17 1058   04/22/17 1059  DME  3 n 1  Once     04/22/17 1058   04/22/17 1059  DME Bedside commode  Once    Question:  Patient needs a bedside commode to treat with the following condition  Answer:  Status post total knee replacement   04/22/17 1058       Discharge Care Instructions        Start     Ordered   04/25/17 0000  oxyCODONE (OXY IR/ROXICODONE) 5 MG immediate release tablet  Every 4 hours PRN    Question:  Supervising Provider  Answer:  Kennedy Bucker   04/25/17 0823   04/25/17 0000  enoxaparin (LOVENOX) 40 MG/0.4ML injection  Every 24 hours    Question:  Supervising Provider  Answer:  Kennedy Bucker   04/25/17 0823   04/25/17 0000  magnesium hydroxide (MILK OF MAGNESIA) 400 MG/5ML suspension  Daily PRN    Question:  Supervising Provider  AnswerKennedy Bucker   04/25/17 4782      Diagnostic Studies: Dg Knee 1-2 Views Left  Result Date: 04/22/2017 CLINICAL DATA:  Status post left total knee joint prosthesis placement. EXAM: LEFT KNEE - 1-2 VIEW COMPARISON:  CT scan of the left lower extremity of March 31, 2017 FINDINGS: The patient has undergone total left knee joint prosthesis placement. Radiographic positioning of the prosthetic components is good. The interface with the native bone appears normal. Skin staples are present. Tiny radiodense structures may reflect drainage catheters or bone stimulating electrodes over the distal thigh. IMPRESSION: No immediate postprocedure complication following left total knee joint prosthesis placement. Electronically Signed    By: David  [place] M.D.   On: 04/22/2017 09:56   Ct Knee Left Wo Contrast  Result Date: 03/31/2017 CLINICAL DATA:  Primary osteoarthritis of the left knee. Pre-surgical planning. EXAM: CT OF THE LEFT KNEE WITHOUT CONTRAST TECHNIQUE: Multidetector CT imaging of the left knee was performed according to the standard protocol. Multiplanar CT image reconstructions were also generated. Axial images were obtained through the hip and ankle for pre-surgical planning. COMPARISON:  None. FINDINGS: Bones/Joint/Cartilage Left ankle appears normal. Minimal marginal osteophyte formation on the superolateral aspect of the acetabulum of the left hip. Left hip otherwise appears normal. There is marked narrowing of the medial compartment of the left knee with marginal osteophyte formation in the medial and lateral compartments but more prominent medially. There is subcortical sclerosis in the medial compartment with subcortical cyst formation in the posterior aspect of the femoral condyles and of the tibial plateau. Moderate to large joint effusion. IMPRESSION: Osteoarthritis of the left knee most prominent in the medial compartment as described. Moderate to large joint effusion. Images available for pre-surgical planning. Electronically Signed   By: Francene Boyers M.D.   On: 03/31/2017 11:57    Disposition: 01-Home or Self Care    Contact information for after-discharge care    Destination    [facility] .   Specialty:  Skilled Nursing Facility Contact information: [address] [place] [address] [phone]               Signed: Patience Musca 04/25/2017, 8:24 AM  Will stop Amlodipine at this time, has had some postural hypotension.

## 2017-04-25 NOTE — Progress Notes (Signed)
Report called to nurse (Reggie) at [facility] place. Pt will transfer pending BM. Laxatives and suppository given.

## 2017-04-25 NOTE — Progress Notes (Signed)
Pt became dizzy during PT session this AM. BP at rest sitting 128/59, pulse 83. Standing BP 109/58, pulse 103. Pt became lightheaded with ringing in ears with minimal activity of standing and taking a few steps. BP medications held. MD notified. Pt is pending D/C to [facility] place today, will await decision from MD.

## 2017-04-25 NOTE — Progress Notes (Signed)
   Subjective: 3 Days Post-Op Procedure(s) (LRB): TOTAL KNEE ARTHROPLASTY (Left) Patient reports pain as mild. Patient is well, and has had no acute complaints or problems. Tolerated PT well yesterday afternoon. BP stable.   Denies any CP, SOB, ABD pain. We will continue therapy today.  Plan is to go Home after hospital stay.  Objective: Vital signs in last 24 hours: Temp:  [98.8 F (37.1 C)-99.3 F (37.4 C)] 98.8 F (37.1 C) (08/26 0724) Pulse Rate:  [80-96] 80 (08/26 0724) Resp:  [18] 18 (08/26 0724) BP: (115-158)/(54-73) 126/61 (08/26 0724) SpO2:  [98 %-99 %] 98 % (08/26 0724)  Intake/Output from previous day: 08/25 0701 - 08/26 0700 In: 4250 [P.O.:120; I.V.:4130] Out: 500 [Urine:500] Intake/Output this shift: No intake/output data recorded.   Recent Labs  04/22/17 1130 04/23/17 0257 04/24/17 0316  HGB 11.5* 11.3* 11.0*    Recent Labs  04/23/17 0257 04/24/17 0316  WBC 11.1* 13.6*  RBC 4.04 3.96  HCT 34.0* 33.2*  PLT 275 295    Recent Labs  04/23/17 0257 04/24/17 0316  NA 132* 133*  K 4.4 4.7  CL 98* 99*  CO2 26 27  BUN 7 10  CREATININE 0.59 0.61  GLUCOSE 285* 273*  CALCIUM 8.2* 8.7*   No results for input(s): LABPT, INR in the last 72 hours.  EXAM General - Patient is Alert, Appropriate and Oriented Extremity - Neurovascular intact Sensation intact distally Intact pulses distally Dorsiflexion/Plantar flexion intact No cellulitis present Compartment soft Dressing - dressing C/D/I and scant drainage Motor Function - intact, moving foot and toes well on exam.   Past Medical History:  Diagnosis Date  . Dementia   . Depression   . Diabetes mellitus, type II (HCC)   . Dyspnea   . Dysrhythmia   . GERD (gastroesophageal reflux disease)   . Headache   . History of hiatal hernia   . Hypercholesteremia   . Hypertension   . Hypothyroidism   . Thyroid disease     Assessment/Plan:   3 Days Post-Op Procedure(s) (LRB): TOTAL KNEE  ARTHROPLASTY (Left) Active Problems:   Primary localized osteoarthritis of left knee   Estimated body mass index is 41.77 kg/m as calculated from the following:   Height as of this encounter: 5\' 5"  (1.651 m).   Weight as of this encounter: 113.9 kg (251 lb). Advance diet Up with therapy  Discharge to SNF today pending BM Follow up with [facility] in 2 weeks   DVT Prophylaxis - Lovenox, Foot Pumps and TED hose Weight-Bearing as tolerated to left leg   T. Cranston Neighbor, PA-C [facility] 04/25/2017, 8:18 AM

## 2017-04-25 NOTE — Discharge Instructions (Signed)

## 2017-04-25 NOTE — Progress Notes (Signed)
Physical Therapy Treatment Patient Details Name: Gina Tate MRN: [id number] DOB: 1951/05/04 Today's Date: 04/25/2017    History of Present Illness Pt underwent L TKR without post-op complications. PMH includes depression, TIA, OA, SVT, GERD, headaches, HTN, and DM.     PT Comments    Participated in exercises as described below.  Pt was able to stand this AM with KI and amb about 4 steps before c/o dizziness and ringing in left ear.  Primary nurse in room and assisted.  BP sitting after gait128/59 P 83.  After short rest and symptoms resolved BP taken in standing 109/58 P 99 with return of symptoms.  Pt was seated back in wheelchair and assisted to recliner where she transferred with min guard x 2 and assist for LLE.  Mobility remains limited by BP and orthostatic symptoms.     Follow Up Recommendations  SNF     Equipment Recommendations  Rolling walker with 5" wheels    Recommendations for Other Services       Precautions / Restrictions Precautions Precautions: Fall;Knee Required Braces or Orthoses: Knee Immobilizer - Left Knee Immobilizer - Left: Discontinue once straight leg raise with < 10 degree lag Restrictions Weight Bearing Restrictions: Yes RLE Weight Bearing: Weight bearing as tolerated LLE Weight Bearing: Weight bearing as tolerated    Mobility  Bed Mobility Overal bed mobility: Needs Assistance Bed Mobility: Sit to Supine     Supine to sit: Min assist     General bed mobility comments: Pt able to use R LE to get L to the edge, needs PT assist to protect L LE as it comes off the bed  Transfers Overall transfer level: Needs assistance Equipment used: Rolling walker (2 wheeled) Transfers: Sit to/from Stand Sit to Stand: Min assist;Min guard         General transfer comment: verbal cues for hand placements at each attempt to stand.  Ambulation/Gait Ambulation/Gait assistance: Min assist Ambulation Distance (Feet): 4 Feet Assistive device:  Rolling walker (2 wheeled) Gait Pattern/deviations: Step-to pattern   Gait velocity interpretation: <1.8 ft/sec, indicative of risk for recurrent falls General Gait Details: Pt took 4 steps from bed and became light headed with c/o ringing in left ear.   Stairs            Wheelchair Mobility    Modified Rankin (Stroke Patients Only)       Balance Overall balance assessment: Needs assistance Sitting-balance support: No upper extremity supported Sitting balance-Leahy Scale: Good     Standing balance support: Bilateral upper extremity supported Standing balance-Leahy Scale: Fair Standing balance comment: highly reliant on the walker                            Cognition Arousal/Alertness: Awake/alert Behavior During Therapy: WFL for tasks assessed/performed Overall Cognitive Status: Within Functional Limits for tasks assessed                                        Exercises Total Joint Exercises Ankle Circles/Pumps: AROM;Both;10 reps;Supine Quad Sets: Strengthening;15 reps Gluteal Sets: Strengthening;15 reps Towel Squeeze: Strengthening;Both;10 reps;Supine Short Arc Quad: AROM;AAROM;5 reps Heel Slides: PROM;AAROM;5 reps Hip ABduction/ADduction: Strengthening;15 reps Straight Leg Raises: PROM;10 reps;AAROM Long Arc Quad: AAROM;Seated;10 reps Knee Flexion: AAROM;Seated;10 reps Goniometric ROM: 2-70  Other Exercises Other Exercises: standing for orthostatic BP's    General Comments  Pertinent Vitals/Pain Pain Assessment: 0-10 Pain Score: 10-Worst pain ever Pain Location: L knee Pain Descriptors / Indicators: Sore;Constant;Aching;Discomfort;Operative site guarding Pain Intervention(s): Limited activity within patient's tolerance;Ice applied;Monitored during session    Home Living                      Prior Function            PT Goals (current goals can now be found in the care plan section) Progress towards PT  goals: Progressing toward goals    Frequency    BID      PT Plan Current plan remains appropriate    Co-evaluation              AM-PAC PT "6 Clicks" Daily Activity  Outcome Measure  Difficulty turning over in bed (including adjusting bedclothes, sheets and blankets)?: A Little Difficulty moving from lying on back to sitting on the side of the bed? : A Little Difficulty sitting down on and standing up from a chair with arms (e.g., wheelchair, bedside commode, etc,.)?: A Little Help needed moving to and from a bed to chair (including a wheelchair)?: A Lot Help needed walking in hospital room?: A Lot Help needed climbing 3-5 steps with a railing? : Total 6 Click Score: 14    End of Session Equipment Utilized During Treatment: Gait belt Activity Tolerance: Patient limited by pain;Other (comment) Patient left: in chair;with chair alarm set;with call bell/phone within reach;with nursing/sitter in room Nurse Communication: Mobility status Pain - Right/Left: Left Pain - part of body: Knee     Time: 4098-1191 PT Time Calculation (min) (ACUTE ONLY): 47 min  Charges:  $Gait Training: 8-22 mins $Therapeutic Exercise: 8-22 mins $Therapeutic Activity: 8-22 mins                    G Codes:       Danielle Dess, PTA 04/25/17, 9:49 AM

## 2017-04-27 ENCOUNTER — Other Ambulatory Visit
Admission: RE | Admit: 2017-04-27 | Discharge: 2017-04-27 | Disposition: A | Discharge status: Home / Self Care | Payer: No Typology Code available for payment source | Source: Ambulatory Visit | Attending: Gerontology | Admitting: Gerontology

## 2017-04-27 DIAGNOSIS — D649 Anemia, unspecified: Secondary | ICD-10-CM | POA: Insufficient documentation

## 2017-04-27 LAB — COMPREHENSIVE METABOLIC PANEL
ALK PHOS: 77 U/L (ref 38–126)
ALT: 19 U/L (ref 14–54)
AST: 17 U/L (ref 15–41)
Albumin: 3 g/dL — ABNORMAL LOW (ref 3.5–5.0)
Anion gap: 9 (ref 5–15)
BUN: 13 mg/dL (ref 6–20)
CHLORIDE: 100 mmol/L — AB (ref 101–111)
CO2: 25 mmol/L (ref 22–32)
CREATININE: 0.75 mg/dL (ref 0.44–1.00)
Calcium: 8.2 mg/dL — ABNORMAL LOW (ref 8.9–10.3)
GFR calc Af Amer: >60 mL/min (ref >60)
GFR calc non Af Amer: >60 mL/min (ref >60)
GLUCOSE: 170 mg/dL — AB (ref 65–99)
Potassium: 4 mmol/L (ref 3.5–5.1)
SODIUM: 134 mmol/L — AB (ref 135–145)
Total Bilirubin: 0.7 mg/dL (ref 0.3–1.2)
Total Protein: 6.3 g/dL — ABNORMAL LOW (ref 6.5–8.1)

## 2017-04-27 LAB — CBC WITH DIFFERENTIAL/PLATELET
BASOS ABS: 0.1 10*3/uL (ref 0–0.1)
Basophils Relative: 1 %
EOS ABS: 0.3 10*3/uL (ref 0–0.7)
EOS PCT: 3 %
HCT: 29.5 % — ABNORMAL LOW (ref 35.0–47.0)
HEMOGLOBIN: 9.7 g/dL — AB (ref 12.0–16.0)
LYMPHS PCT: 30 %
Lymphs Abs: 3.8 10*3/uL — ABNORMAL HIGH (ref 1.0–3.6)
MCH: 27.4 pg (ref 26.0–34.0)
MCHC: 32.8 g/dL (ref 32.0–36.0)
MCV: 83.7 fL (ref 80.0–100.0)
Monocytes Absolute: 1.1 10*3/uL — ABNORMAL HIGH (ref 0.2–0.9)
Monocytes Relative: 8 %
NEUTROS PCT: 58 %
Neutro Abs: 7.5 10*3/uL — ABNORMAL HIGH (ref 1.4–6.5)
PLATELETS: 352 10*3/uL (ref 150–440)
RBC: 3.52 MIL/uL — AB (ref 3.80–5.20)
RDW: 15.9 % — ABNORMAL HIGH (ref 11.5–14.5)
WBC: 12.9 10*3/uL — AB (ref 3.6–11.0)

## 2017-04-28 ENCOUNTER — Other Ambulatory Visit: Payer: Self-pay

## 2017-04-28 ENCOUNTER — Non-Acute Institutional Stay (SKILLED_NURSING_FACILITY): Payer: Medicare Other | Admitting: Gerontology

## 2017-04-28 ENCOUNTER — Other Ambulatory Visit
Admission: RE | Admit: 2017-04-28 | Discharge: 2017-04-28 | Disposition: A | Discharge status: Home / Self Care | Payer: Medicare Other | Source: Ambulatory Visit | Attending: Gerontology | Admitting: Gerontology

## 2017-04-28 DIAGNOSIS — I824Y2 Acute embolism and thrombosis of unspecified deep veins of left proximal lower extremity: Secondary | ICD-10-CM

## 2017-04-28 DIAGNOSIS — IMO0001 Reserved for inherently not codable concepts without codable children: Secondary | ICD-10-CM

## 2017-04-28 DIAGNOSIS — D649 Anemia, unspecified: Secondary | ICD-10-CM | POA: Insufficient documentation

## 2017-04-28 DIAGNOSIS — M1712 Unilateral primary osteoarthritis, left knee: Secondary | ICD-10-CM

## 2017-04-28 DIAGNOSIS — T814XXA Infection following a procedure, initial encounter: Secondary | ICD-10-CM

## 2017-04-28 DIAGNOSIS — E46 Unspecified protein-calorie malnutrition: Secondary | ICD-10-CM

## 2017-04-28 LAB — CBC WITH DIFFERENTIAL/PLATELET
BASOS ABS: 0.1 10*3/uL (ref 0–0.1)
BASOS PCT: 1 %
Eosinophils Absolute: 0.3 10*3/uL (ref 0–0.7)
Eosinophils Relative: 2 %
HEMATOCRIT: 30.8 % — AB (ref 35.0–47.0)
HEMOGLOBIN: 10.6 g/dL — AB (ref 12.0–16.0)
Lymphocytes Relative: 28 %
Lymphs Abs: 3.5 10*3/uL (ref 1.0–3.6)
MCH: 29.1 pg (ref 26.0–34.0)
MCHC: 34.4 g/dL (ref 32.0–36.0)
MCV: 84.5 fL (ref 80.0–100.0)
MONOS PCT: 7 %
Monocytes Absolute: 0.9 10*3/uL (ref 0.2–0.9)
NEUTROS ABS: 8 10*3/uL — AB (ref 1.4–6.5)
NEUTROS PCT: 62 %
Platelets: 406 10*3/uL (ref 150–440)
RBC: 3.65 MIL/uL — AB (ref 3.80–5.20)
RDW: 15.8 % — ABNORMAL HIGH (ref 11.5–14.5)
WBC: 12.9 10*3/uL — AB (ref 3.6–11.0)

## 2017-04-28 MED ORDER — OXYCODONE HCL 5 MG PO TABS: 5.0000 mg | ORAL_TABLET | ORAL | 0 refills | Status: DC | PRN

## 2017-04-28 NOTE — Telephone Encounter (Signed)
Rx sent to [facility] phone : [phone] , fax : [phone]  

## 2017-04-30 ENCOUNTER — Encounter: Payer: Self-pay | Admitting: Gerontology

## 2017-04-30 NOTE — Progress Notes (Signed)
Location:   [facility] Room Number: [place] of Service:  SNF [phone]) Provider:  Toni Arthurs, NP-C  Leonel Ramsay, MD  Patient Care Team: Leonel Ramsay, MD as PCP - General (Infectious Diseases)  Extended Emergency Contact Information Primary Emergency Contact: Delpriore,Richard L Address: [place], [place] [address] [place] Phone: [phone] Mobile Phone: [phone] Relation: Spouse Secondary Emergency Contact: Cortes,Michelle Address: [address]          [place], [place] [address] [place] Phone: [phone] Work Phone: [phone] Relation: Daughter  Code Status:  DNR Goals of care: Advanced Directive information Advanced Directives 04/30/2017  Does Patient Have a Medical Advance Directive? Yes  Type of Advance Directive Out of facility DNR (pink MOST or yellow form)  Does patient want to make changes to medical advance directive? No - Patient declined  Would patient like information on creating a medical advance directive? -  Some encounter information is confidential and restricted. Go to Review Flowsheets activity to see all data.     Chief Complaint  Patient presents with  . Acute Visit    Follow up on DVT    HPI:  Pt is a 66 y.o. female seen today for an acute visit for Left leg pain and edema. Pt called her Orthopedist about the swelling. Dr Rudene Christians wanted a doppler done. This was done at his request. Doppler was positive for DVT. Left lower leg with mild edema, mild tenderness. Feet warm. B-pedal pulses equal and strong. TED Hose have been removed. Pt resting in bed with leg elevated. Pt denies chest pain or shortness of breath. O2 sats normal. No signs of PE. Pt reports pain manageable. Pt reports her appetite is fair and endorses she has not been eating as well as she should at home. Protein and Albumin low on labs obtained yesterday. Will add  nutritional supplements. Peri-incisional tissue with mild redness, warmth, serous drainage. Tender to touch. Staples intact. Otherwise, pt reports she is feeling well. VSS. No other complaints.    Past Medical History:  Diagnosis Date  . Atypical chest pain   . Dementia   . Depression    unspecified  . Diabetes mellitus type 2, uncomplicated ([place])   . Diabetes mellitus, type II ([place])   . Dyspnea   . Dysrhythmia   . Elevated transaminase level    chronic, probably on basis of steatorrhea syndrome  . Fracture of right patella    not operated  . GERD (gastroesophageal reflux disease)   . Headache   . Headache, variant migraine   . History of cervical dysplasia    status post two normal pap smears, 02/2002 and 05/2002, status post LEEP  . History of hiatal hernia   . Hypercholesteremia   . Hyperlipidemia, unspecified   . Hypertension   . Hypothyroidism   . Morton's neuroma, left   . Obesity, unspecified   . Osteoarthritis   . Thyroid disease   . TIA (transient ischemic attack)    Past Surgical History:  Procedure Laterality Date  . BACK SURGERY    . CERVICAL BIOPSY  W/ LOOP ELECTRODE EXCISION  12/07/2001  . ESOPHAGOGASTRODUODENOSCOPY N/A 10/07/2015   Procedure: ESOPHAGOGASTRODUODENOSCOPY (EGD);  Surgeon: Hulen Luster, MD;  Location: [facility] ENDOSCOPY;  Service: Gastroenterology;  Laterality: N/A;  . ESOPHAGOGASTRODUODENOSCOPY  04/28/1993  Revealed moderate grade 2 distal esophagitis and small, intermittent hiatal hernia.   [facility] HAND SURGERY Right   . SPINE SURGERY    . TONSILLECTOMY    . TOTAL KNEE ARTHROPLASTY Left 04/22/2017   Procedure: TOTAL KNEE ARTHROPLASTY;  Surgeon: Hessie Knows, MD;  Location: [facility] ORS;  Service: Orthopedics;  Laterality: Left;  . TRIGGER FINGER RELEASE Right   . TRIGGER FINGER RELEASE Right 04/29/2015   right long finger  ; Dr. Rudene Christians  . TUBAL LIGATION      No Known Allergies  Allergies as of 04/28/2017   No Known Allergies     Medication List        Accurate as of 04/28/17 11:59 PM. Always use your most recent med list.          aspirin EC 81 MG tablet Take 81 mg by mouth daily.   atenolol 25 MG tablet Commonly known as:  TENORMIN Take 25 mg by mouth every evening.   atorvastatin 10 MG tablet Commonly known as:  LIPITOR Take 10 mg by mouth daily at 6 PM.   cephALEXin 500 MG capsule Commonly known as:  KEFLEX Take 500 mg by mouth 3 (three) times daily.   docusate sodium 100 MG capsule Commonly known as:  COLACE Take 100 mg by mouth daily.   ENSURE ENLIVE PO Take 1 Bottle by mouth 2 (two) times daily between meals.   feeding supplement (PRO-STAT SUGAR FREE 64) Liqd Take 30 mLs by mouth 2 (two) times daily between meals.   ferrous sulfate 325 (65 FE) MG tablet Take 325 mg by mouth daily with breakfast.   insulin NPH-regular Human (70-30) 100 UNIT/ML injection Commonly known as:  NOVOLIN 70/30 Inject 35-40 Units into the skin 2 (two) times daily with a meal. If blood sugar is 100- 120, 35 units, and  If above 150, it's 40 units   lansoprazole 15 MG capsule Commonly known as:  PREVACID Take 15 mg by mouth 2 (two) times daily before a meal.   levothyroxine 137 MCG tablet Commonly known as:  SYNTHROID, LEVOTHROID Take 137 mcg by mouth daily before breakfast.   losartan 100 MG tablet Commonly known as:  COZAAR Take 100 mg by mouth daily.   magnesium hydroxide 400 MG/5ML suspension Commonly known as:  MILK OF MAGNESIA Take 30 mLs by mouth daily as needed for mild constipation.   Melatonin 5 MG Tabs Take 1 tablet by mouth at bedtime.   metFORMIN 1000 MG tablet Commonly known as:  GLUCOPHAGE Take 1,000 mg by mouth 2 (two) times daily with a meal.   nortriptyline 10 MG capsule Commonly known as:  PAMELOR Take 1 capsule (10 mg total) by mouth at bedtime.   oxyCODONE 5 MG immediate release tablet Commonly known as:  Oxy IR/ROXICODONE Take 1-2 tablets (5-10 mg total) by mouth every 4 (four) hours as needed for  breakthrough pain.   RA VITAMIN B-12 TR 1000 MCG Tbcr Generic drug:  Cyanocobalamin Take 1 tablet by mouth 2 (two) times daily.   Rivaroxaban 15 MG Tabs tablet Commonly known as:  XARELTO Take 15 mg by mouth 2 (two) times daily with a meal.   venlafaxine 75 MG tablet Commonly known as:  EFFEXOR Take 1 tablet (75 mg total) by mouth daily.       Review of Systems  Constitutional: Negative for activity change, appetite change, chills, diaphoresis and fever.  HENT: Negative for congestion, sneezing, sore throat, trouble swallowing and voice change.   Respiratory: Negative for  apnea, cough, choking, chest tightness, shortness of breath and wheezing.   Cardiovascular: Negative for chest pain, palpitations and leg swelling.  Gastrointestinal: Negative for abdominal distention, abdominal pain, constipation, diarrhea and nausea.  Genitourinary: Negative for difficulty urinating, dysuria, frequency and urgency.  Musculoskeletal: Positive for arthralgias (typical arthritis). Negative for back pain, gait problem and myalgias.  Skin: Positive for wound. Negative for color change, pallor and rash.  Neurological: Negative for dizziness, tremors, syncope, speech difficulty, weakness, numbness and headaches.  Psychiatric/Behavioral: Negative for agitation and behavioral problems.  All other systems reviewed and are negative.    There is no immunization history on file for this patient. Pertinent  Health Maintenance Due  Topic Date Due  . HEMOGLOBIN A1C  September 11, 1950  . FOOT EXAM  02/24/1960  . OPHTHALMOLOGY EXAM  02/24/1960  . MAMMOGRAM  02/24/2000  . COLONOSCOPY  02/24/2000  . DEXA SCAN  02/24/2015  . PNA vac Low Risk Adult (1 of 2 - PCV13) 02/24/2015  . INFLUENZA VACCINE  03/31/2017   Fall Risk  01/26/2017 01/05/2017 11/23/2016 10/12/2016 09/07/2016  Falls in the past year? (No Data) No (No Data) (No Data) (No Data)  Comment no falls since previous visit - no falls since previous visit no falls  since previous visit no falls since previous visit  Risk for fall due to : - - - - -  Risk for fall due to: Comment - - - - -   Functional Status Survey:    Vitals:   04/28/17 1548  BP: 130/61  Pulse: 94  Resp: 18  Temp: 98.4 F (36.9 C)  SpO2: 98%  Weight: 246 lb (111.6 kg)  Height: 5\' 5"  (1.651 m)   Body mass index is 40.94 kg/m. Physical Exam  Constitutional: She is oriented to person, place, and time. Vital signs are normal. She appears well-developed and well-nourished. She is active and cooperative. She does not appear ill. No distress.  HENT:  Head: Normocephalic and atraumatic.  Mouth/Throat: Uvula is midline, oropharynx is clear and moist and mucous membranes are normal. Mucous membranes are not pale, not dry and not cyanotic.  Eyes: Pupils are equal, round, and reactive to light. Conjunctivae, EOM and lids are normal.  Neck: Trachea normal, normal range of motion and full passive range of motion without pain. Neck supple. No JVD present. No tracheal deviation, no edema and no erythema present. No thyromegaly present.  Cardiovascular: Normal rate, regular rhythm, normal heart sounds, intact distal pulses and normal pulses.  Exam reveals no gallop, no distant heart sounds and no friction rub.   No murmur heard. Pulmonary/Chest: Effort normal and breath sounds normal. No accessory muscle usage. No respiratory distress. She has no wheezes. She has no rales. She exhibits no tenderness.  Abdominal: Normal appearance and bowel sounds are normal. She exhibits no distension and no ascites. There is no tenderness.  Musculoskeletal: She exhibits no edema or tenderness.       Left knee: She exhibits decreased range of motion, swelling, laceration and erythema.  Expected osteoarthritis, stiffness. Right calf soft, supple. Negative Homan's sign. Left calf- slight swelling, tenderness, erythema. + DVT  Neurological: She is alert and oriented to person, place, and time. She has normal  strength.  Skin: Skin is warm and dry. Laceration (incision) noted. No rash noted. She is not diaphoretic. There is erythema (left knee). No cyanosis. No pallor. Nails show no clubbing.  Psychiatric: She has a normal mood and affect. Her speech is normal and behavior is normal.  Judgment and thought content normal. Cognition and memory are normal.  Nursing note and vitals reviewed.   Labs reviewed:  Recent Labs  04/23/17 0257 04/24/17 0316 04/27/17 0400  NA 132* 133* 134*  K 4.4 4.7 4.0  CL 98* 99* 100*  CO2 _0 GLUCOSE 285* 273* 170*  BUN _1 CREATININE 0.59 0.61 0.75  CALCIUM 8.2* 8.7* 8.2*    Recent Labs  04/27/17 0400  AST 17  ALT 19  ALKPHOS 77  BILITOT 0.7  PROT 6.3*  ALBUMIN 3.0*    Recent Labs  04/24/17 0316 04/27/17 0400 04/28/17 1344  WBC 13.6* 12.9* 12.9*  NEUTROABS  --  7.5* 8.0*  HGB 11.0* 9.7* 10.6*  HCT 33.2* 29.5* 30.8*  MCV 83.8 83.7 84.5  PLT 295 352 406   No results found for: TSH No results found for: HGBA1C No results found for: CHOL, HDL, LDLCALC, LDLDIRECT, TRIG, CHOLHDL  Significant Diagnostic Results in last 30 days:  Dg Knee 1-2 Views Left  Result Date: 04/22/2017 CLINICAL DATA:  Status post left total knee joint prosthesis placement. EXAM: LEFT KNEE - 1-2 VIEW COMPARISON:  CT scan of the left lower extremity of March 31, 2017 FINDINGS: The patient has undergone total left knee joint prosthesis placement. Radiographic positioning of the prosthetic components is good. The interface with the native bone appears normal. Skin staples are present. Tiny radiodense structures may reflect drainage catheters or bone stimulating electrodes over the distal thigh. IMPRESSION: No immediate postprocedure complication following left total knee joint prosthesis placement. Electronically Signed   By: David  Martinique M.D.   On: 04/22/2017 09:56    Assessment/Plan 1. Primary localized osteoarthritis of left knee  Continue PT/OT  Continue  exercises as taught by PT/OT  Continue Oxycodone 5 mg 1-2 tablets po Q 4 hours prn pain  Continue ice pack to the knee prn  2. Acute deep vein thrombosis (DVT) of proximal vein of left lower extremity (HCC)  DC Lovenox  Xraelto 15 mg po BID x 21 days, then  Xarelto 20 mg po Q Day  Heat to the leg prn for pain (not on the incision)  Follow up with cardiologist asap after discharge for management of xarelto  3. Protein-calorie malnutrition, unspecified severity (HCC)  Prostat 30 mL po BID  Ensure Enlive 1 botte/ can po BID  4.Cellulitis, wound, post-operative, initial encounter   Keflex 500 mg po TID x 7 days  Family/ staff Communication:   Total Time:  Documentation:  Face to Face:  Family/Phone:   Labs/tests ordered:  Cbc, met c  Medication list reviewed and assessed for continued appropriateness.  Vikki Ports, NP-C Geriatrics [facility] [phone] [address], [place] [address] Cell Phone (Mon-Fri 8am-5pm):  [phone] On Call:  [phone] & follow prompts after 5pm & weekends Office Phone:  [phone] Office Fax:  [phone]

## 2017-05-01 ENCOUNTER — Encounter
Admission: RE | Admit: 2017-05-01 | Discharge: 2017-05-01 | Disposition: A | Discharge status: Home / Self Care | Payer: Medicare Other | Source: Ambulatory Visit | Attending: Internal Medicine | Admitting: Internal Medicine

## 2017-05-07 ENCOUNTER — Encounter: Payer: Self-pay | Admitting: Gerontology

## 2017-05-07 ENCOUNTER — Non-Acute Institutional Stay (SKILLED_NURSING_FACILITY): Payer: Medicare Other | Admitting: Gerontology

## 2017-05-07 DIAGNOSIS — IMO0001 Reserved for inherently not codable concepts without codable children: Secondary | ICD-10-CM

## 2017-05-07 DIAGNOSIS — T814XXA Infection following a procedure, initial encounter: Secondary | ICD-10-CM | POA: Diagnosis not present

## 2017-05-07 DIAGNOSIS — I824Y2 Acute embolism and thrombosis of unspecified deep veins of left proximal lower extremity: Secondary | ICD-10-CM | POA: Diagnosis not present

## 2017-05-07 DIAGNOSIS — E46 Unspecified protein-calorie malnutrition: Secondary | ICD-10-CM | POA: Diagnosis not present

## 2017-05-07 DIAGNOSIS — M1712 Unilateral primary osteoarthritis, left knee: Secondary | ICD-10-CM

## 2017-05-07 NOTE — Progress Notes (Signed)
Location:   [facility] Room Number: 202A Place of Service:  SNF [phone]) Provider:  Lorenso Quarry, NP-C  Mick Sell, MD  Patient Care Team: Mick Sell, MD as PCP - General (Infectious Diseases)  Extended Emergency Contact Information Primary Emergency Contact: Wohl,Richard L Address: [address]          [address]          [place], [place] [address] [place] Home Phone: [phone] Mobile Phone: [phone] Relation: Spouse Secondary Emergency Contact: Cortes,Michelle Address: [address]          [place], [place] [address] [place] Home Phone: [phone] Work Phone: [phone] Relation: Daughter  Code Status:  DNR Goals of care: Advanced Directive information Advanced Directives 05/07/2017  Does Patient Have a Medical Advance Directive? Yes  Type of Advance Directive Out of facility DNR (pink MOST or yellow form)  Does patient want to make changes to medical advance directive? No - Patient declined  Would patient like information on creating a medical advance directive? -  Some encounter information is confidential and restricted. Go to Review Flowsheets activity to see all data.     Chief Complaint  Patient presents with  . Medical Management of Chronic Issues    Routine Visit    HPI:  Pt is a 66 y.o. female seen today for Discharge evaluation for LTKA and for Left leg pain and edema. Pt has been participating with PT/OT. She reports her pain is well controlled on current regimen. Doppler done a week ago was positive for DVT. Left lower leg with mild edema, mild tenderness- improved. Feet warm. B-pedal pulses equal and strong. TED Hose have been removed. Pt resting in bed with leg elevated typically. Pt denies chest pain or shortness of breath. O2 sats normal. No signs of PE. Pt reports pain manageable. Pt reports her appetite is fair and endorses she has not been eating as well as she should at  home. Protein and Albumin low on labs, started pt on nutritional supplements. Peri-incisional tissue with no redness, warmth, serous drainage. Nontender to touch. Staples intact. Otherwise, pt reports she is feeling well. Pt is ready to go home. VSS. No other complaints.    Past Medical History:  Diagnosis Date  . Atypical chest pain   . Dementia   . Depression    unspecified  . Diabetes mellitus type 2, uncomplicated (HCC)   . Diabetes mellitus, type II (HCC)   . Dyspnea   . Dysrhythmia   . Elevated transaminase level    chronic, probably on basis of steatorrhea syndrome  . Fracture of right patella    not operated  . GERD (gastroesophageal reflux disease)   . Headache   . Headache, variant migraine   . History of cervical dysplasia    status post two normal pap smears, 02/2002 and 05/2002, status post LEEP  . History of hiatal hernia   . Hypercholesteremia   . Hyperlipidemia, unspecified   . Hypertension   . Hypothyroidism   . Morton's neuroma, left   . Obesity, unspecified   . Osteoarthritis   . Thyroid disease   . TIA (transient ischemic attack)    Past Surgical History:  Procedure Laterality Date  . BACK SURGERY    . CERVICAL BIOPSY  W/ LOOP ELECTRODE EXCISION  12/07/2001  . ESOPHAGOGASTRODUODENOSCOPY N/A 10/07/2015   Procedure: ESOPHAGOGASTRODUODENOSCOPY (EGD);  Surgeon: Wallace Cullens, MD;  Location: [facility] ENDOSCOPY;  Service: Gastroenterology;  Laterality: N/A;  . ESOPHAGOGASTRODUODENOSCOPY  04/28/1993   Revealed moderate grade 2 distal esophagitis and small, intermittent hiatal hernia.   Marland Kitchen HAND SURGERY Right   . SPINE SURGERY    . TONSILLECTOMY    . TOTAL KNEE ARTHROPLASTY Left 04/22/2017   Procedure: TOTAL KNEE ARTHROPLASTY;  Surgeon: Kennedy Bucker, MD;  Location: [facility] ORS;  Service: Orthopedics;  Laterality: Left;  . TRIGGER FINGER RELEASE Right   . TRIGGER FINGER RELEASE Right 04/29/2015   right long finger  ; Dr. Rosita Kea  . TUBAL LIGATION      No Known  Allergies  Allergies as of 05/07/2017   No Known Allergies     Medication List       Accurate as of 05/07/17 12:18 PM. Always use your most recent med list.          aspirin EC 81 MG tablet Take 81 mg by mouth daily.   atenolol 25 MG tablet Commonly known as:  TENORMIN Take 25 mg by mouth every evening.   atorvastatin 10 MG tablet Commonly known as:  LIPITOR Take 10 mg by mouth daily at 6 PM.   docusate sodium 100 MG capsule Commonly known as:  COLACE Take 100 mg by mouth daily.   ENSURE ENLIVE PO Take 1 Bottle by mouth 2 (two) times daily between meals.   feeding supplement (PRO-STAT SUGAR FREE 64) Liqd Take 30 mLs by mouth 2 (two) times daily between meals.   ferrous sulfate 325 (65 FE) MG tablet Take 325 mg by mouth daily with breakfast.   insulin NPH-regular Human (70-30) 100 UNIT/ML injection Commonly known as:  NOVOLIN 70/30 Inject 35-40 Units into the skin 2 (two) times daily with a meal. If blood sugar is 100- 120, 35 units, and  If above 150, it's 40 units   lansoprazole 15 MG capsule Commonly known as:  PREVACID Take 15 mg by mouth 2 (two) times daily before a meal.   levothyroxine 137 MCG tablet Commonly known as:  SYNTHROID, LEVOTHROID Take 137 mcg by mouth daily before breakfast.   losartan 100 MG tablet Commonly known as:  COZAAR Take 100 mg by mouth daily.   magnesium hydroxide 400 MG/5ML suspension Commonly known as:  MILK OF MAGNESIA Take 30 mLs by mouth daily as needed for mild constipation.   Melatonin 5 MG Tabs Take 1 tablet by mouth at bedtime.   metFORMIN 1000 MG tablet Commonly known as:  GLUCOPHAGE Take 1,000 mg by mouth 2 (two) times daily with a meal.   nortriptyline 10 MG capsule Commonly known as:  PAMELOR Take 1 capsule (10 mg total) by mouth at bedtime.   oxyCODONE 5 MG immediate release tablet Commonly known as:  Oxy IR/ROXICODONE Take 1-2 tablets (5-10 mg total) by mouth every 4 (four) hours as needed for breakthrough  pain.   RA VITAMIN B-12 TR 1000 MCG Tbcr Generic drug:  Cyanocobalamin Take 1 tablet by mouth 2 (two) times daily.   Rivaroxaban 15 MG Tabs tablet Commonly known as:  XARELTO Take 15 mg by mouth 2 (two) times daily with a meal.   venlafaxine 75 MG tablet Commonly known as:  EFFEXOR Take 1 tablet (75 mg total) by mouth daily.       Review of Systems  Constitutional: Negative for activity change, appetite change, chills, diaphoresis and fever.  HENT: Negative for congestion, sneezing, sore throat, trouble swallowing and voice change.   Respiratory: Negative for apnea, cough, choking, chest tightness, shortness of breath  and wheezing.   Cardiovascular: Negative for chest pain, palpitations and leg swelling.  Gastrointestinal: Negative for abdominal distention, abdominal pain, constipation, diarrhea and nausea.  Genitourinary: Negative for difficulty urinating, dysuria, frequency and urgency.  Musculoskeletal: Positive for arthralgias (typical arthritis). Negative for back pain, gait problem and myalgias.  Skin: Positive for wound. Negative for color change, pallor and rash.  Neurological: Negative for dizziness, tremors, syncope, speech difficulty, weakness, numbness and headaches.  Psychiatric/Behavioral: Negative for agitation and behavioral problems.  All other systems reviewed and are negative.    There is no immunization history on file for this patient. Pertinent  Health Maintenance Due  Topic Date Due  . HEMOGLOBIN A1C  [birth year]50  . FOOT EXAM  02/24/1960  . OPHTHALMOLOGY EXAM  02/24/1960  . MAMMOGRAM  02/24/2000  . COLONOSCOPY  02/24/2000  . DEXA SCAN  02/24/2015  . PNA vac Low Risk Adult (1 of 2 - PCV13) 02/24/2015  . INFLUENZA VACCINE  03/31/2017   Fall Risk  01/26/2017 01/05/2017 11/23/2016 10/12/2016 09/07/2016  Falls in the past year? (No Data) No (No Data) (No Data) (No Data)  Comment no falls since previous visit - no falls since previous visit no falls since  previous visit no falls since previous visit  Risk for fall due to : - - - - -  Risk for fall due to: Comment - - - - -   Functional Status Survey:    Vitals:   05/07/17 1215  BP: 124/62  Pulse: 94  Resp: 20  Temp: 97.7 F (36.5 C)  SpO2: 96%  Weight: 246 lb (111.6 kg)  Height: 5\' 5"  (1.651 m)   Body mass index is 40.94 kg/m. Physical Exam  Constitutional: She is oriented to person, place, and time. Vital signs are normal. She appears well-developed and well-nourished. She is active and cooperative. She does not appear ill. No distress.  HENT:  Head: Normocephalic and atraumatic.  Mouth/Throat: Uvula is midline, oropharynx is clear and moist and mucous membranes are normal. Mucous membranes are not pale, not dry and not cyanotic.  Eyes: Pupils are equal, round, and reactive to light. Conjunctivae, EOM and lids are normal.  Neck: Trachea normal, normal range of motion and full passive range of motion without pain. Neck supple. No JVD present. No tracheal deviation, no edema and no erythema present. No thyromegaly present.  Cardiovascular: Normal rate, regular rhythm, normal heart sounds, intact distal pulses and normal pulses.  Exam reveals no gallop, no distant heart sounds and no friction rub.   No murmur heard. Pulmonary/Chest: Effort normal and breath sounds normal. No accessory muscle usage. No respiratory distress. She has no wheezes. She has no rales. She exhibits no tenderness.  Abdominal: Normal appearance and bowel sounds are normal. She exhibits no distension and no ascites. There is no tenderness.  Musculoskeletal: She exhibits no edema or tenderness.       Left knee: She exhibits decreased range of motion, swelling, laceration and erythema.  Expected osteoarthritis, stiffness. Right calf soft, supple. Negative Homan's sign. Left calf- slight swelling, tenderness, erythema. + DVT  Neurological: She is alert and oriented to person, place, and time. She has normal strength.   Skin: Skin is warm and dry. Laceration (incision) noted. No rash noted. She is not diaphoretic. No cyanosis or erythema. No pallor. Nails show no clubbing.  Psychiatric: She has a normal mood and affect. Her speech is normal and behavior is normal. Judgment and thought content normal. Cognition and memory are normal.  Nursing note and vitals reviewed.   Labs reviewed:  Recent Labs  04/23/17 0257 04/24/17 0316 04/27/17 0400  NA 132* 133* 134*  K 4.4 4.7 4.0  CL 98* 99* 100*  CO2 GLUCOSE 285* 273* 170*  BUN CREATININE 0.59 0.61 0.75  CALCIUM 8.2* 8.7* 8.2*    Recent Labs  04/27/17 0400  AST 17  ALT 19  ALKPHOS 77  BILITOT 0.7  PROT 6.3*  ALBUMIN 3.0*    Recent Labs  04/24/17 0316 04/27/17 0400 04/28/17 1344  WBC 13.6* 12.9* 12.9*  NEUTROABS  --  7.5* 8.0*  HGB 11.0* 9.7* 10.6*  HCT 33.2* 29.5* 30.8*  MCV 83.8 83.7 84.5  PLT 295 352 406   No results found for: TSH No results found for: HGBA1C No results found for: CHOL, HDL, LDLCALC, LDLDIRECT, TRIG, CHOLHDL  Significant Diagnostic Results in last 30 days:  Dg Knee 1-2 Views Left  Result Date: 04/22/2017 CLINICAL DATA:  Status post left total knee joint prosthesis placement. EXAM: LEFT KNEE - 1-2 VIEW COMPARISON:  CT scan of the left lower extremity of March 31, 2017 FINDINGS: The patient has undergone total left knee joint prosthesis placement. Radiographic positioning of the prosthetic components is good. The interface with the native bone appears normal. Skin staples are present. Tiny radiodense structures may reflect drainage catheters or bone stimulating electrodes over the distal thigh. IMPRESSION: No immediate postprocedure complication following left total knee joint prosthesis placement. Electronically Signed   By: David  [place] M.D.   On: 04/22/2017 09:56    Assessment/Plan 1. Primary localized osteoarthritis of left knee  Continue PT/OT  Continue exercises as taught by  PT/OT  Continue Oxycodone 5 mg 1-2 tablets po Q 4 hours prn pain # 30, no refill  Continue ice pack to the knee prn  2. Acute deep vein thrombosis (DVT) of proximal vein of left lower extremity (HCC)  Xarelto 15 mg po BID x 21 days, then  Xarelto 20 mg po Q Day  Heat to the leg prn for pain (not on the incision)  Follow up with cardiologist asap after discharge for management of xarelto  3. Protein-calorie malnutrition, unspecified severity (HCC)  Prostat 30 mL po BID  Ensure Enlive 1 botte/ can po BID  4.Cellulitis, wound, post-operative, initial encounter   Keflex 500 mg po TID x 7 days- resolved   Family/ staff Communication:   Total Time:  Documentation:  Face to Face:  Family/Phone:   Labs/tests ordered:  Per pcp  Medication list reviewed and assessed for continued appropriateness. Monthly medication orders reviewed and signed.  Patient is being discharged with the following home health services:  OPPT [facility]  Patient is being discharged with the following durable medical equipment: none needed   Patient has been advised to f/u with their PCP in 1-2 weeks to bring them up to date on their rehab stay.  Social services at facility was responsible for arranging this appointment.  Pt was provided with a 30 day supply of prescriptions for medications and refills must be obtained from their PCP.  For controlled substances, a more limited supply may be provided adequate until PCP appointment only.  Brynda Rim, NP-C Geriatrics [facility] [phone], [place] [address] Cell Phone (Mon-Fri 8am-5pm):  [phone] On Call:  [phone] & follow prompts after 5pm & weekends Office Phone:  [phone] Office Fax:  [phone]

## 2017-05-11 DIAGNOSIS — E46 Unspecified protein-calorie malnutrition: Secondary | ICD-10-CM | POA: Insufficient documentation

## 2017-05-14 ENCOUNTER — Encounter (INDEPENDENT_AMBULATORY_CARE_PROVIDER_SITE_OTHER): Payer: Self-pay | Admitting: Vascular Surgery

## 2017-05-14 ENCOUNTER — Ambulatory Visit (INDEPENDENT_AMBULATORY_CARE_PROVIDER_SITE_OTHER): Payer: Medicare Other | Admitting: Vascular Surgery

## 2017-05-14 VITALS — BP 137/76 | HR 93 | Resp 16 | Ht 65.0 in | Wt 241.0 lb

## 2017-05-14 DIAGNOSIS — I10 Essential (primary) hypertension: Secondary | ICD-10-CM | POA: Diagnosis not present

## 2017-05-14 DIAGNOSIS — Z794 Long term (current) use of insulin: Secondary | ICD-10-CM

## 2017-05-14 DIAGNOSIS — E119 Type 2 diabetes mellitus without complications: Secondary | ICD-10-CM

## 2017-05-14 DIAGNOSIS — E7800 Pure hypercholesterolemia, unspecified: Secondary | ICD-10-CM

## 2017-05-14 DIAGNOSIS — I82412 Acute embolism and thrombosis of left femoral vein: Secondary | ICD-10-CM

## 2017-05-14 DIAGNOSIS — I82409 Acute embolism and thrombosis of unspecified deep veins of unspecified lower extremity: Secondary | ICD-10-CM | POA: Insufficient documentation

## 2017-05-14 NOTE — Assessment & Plan Note (Signed)
blood glucose control important in reducing the progression of atherosclerotic disease. Also, involved in wound healing. On appropriate medications.  

## 2017-05-14 NOTE — Progress Notes (Signed)
Patient ID: Gina Tate, female   DOB: July 02, 1951, 66 y.o.   MRN: [id number]  Chief Complaint  Patient presents with  . New Patient (Initial Visit)    dvt    HPI Gina Tate is a 66 y.o. female.  I am asked to see the patient by Dr. Ubaldo Glassing for evaluation of DVT.  The patient reports pain and swelling in her left leg. She had a knee replacement will over 3 weeks ago, and shortly after the knee replacement began having worsening pain, swelling, and redness in the left lower leg. She did not recover as they had expected and prompted an ultrasound evaluation which was done about 2 weeks ago. This demonstrated left deep femoral vein DVT. The patient has been wearing compression stockings but has not been very active. She was actually in a rehabilitation facility/nursing home until last week. She says all she did was lay in bed. Her daughter has been trying to get her to do more walking. Her compression garments have helped the swelling but the pain persists. She was started on Xarelto but did not get the twice a day dosing until this week. She has had no signs of bleeding or problems with her blood thinners.   Past Medical History:  Diagnosis Date  . Atypical chest pain   . Dementia   . Depression    unspecified  . Diabetes mellitus type 2, uncomplicated ([place])   . Diabetes mellitus, type II ([place])   . Dyspnea   . Dysrhythmia   . Elevated transaminase level    chronic, probably on basis of steatorrhea syndrome  . Fracture of right patella    not operated  . GERD (gastroesophageal reflux disease)   . Headache   . Headache, variant migraine   . History of cervical dysplasia    status post two normal pap smears, 02/2002 and 05/2002, status post LEEP  . History of hiatal hernia   . Hypercholesteremia   . Hyperlipidemia, unspecified   . Hypertension   . Hypothyroidism   . Morton's neuroma, left   . Obesity, unspecified   . Osteoarthritis   . Thyroid disease   . TIA (transient  ischemic attack)     Past Surgical History:  Procedure Laterality Date  . BACK SURGERY    . CERVICAL BIOPSY  W/ LOOP ELECTRODE EXCISION  12/07/2001  . ESOPHAGOGASTRODUODENOSCOPY N/A 10/07/2015   Procedure: ESOPHAGOGASTRODUODENOSCOPY (EGD);  Surgeon: Hulen Luster, MD;  Location: [facility] ENDOSCOPY;  Service: Gastroenterology;  Laterality: N/A;  . ESOPHAGOGASTRODUODENOSCOPY  04/28/1993   Revealed moderate grade 2 distal esophagitis and small, intermittent hiatal hernia.   Marland Kitchen HAND SURGERY Right   . SPINE SURGERY    . TONSILLECTOMY    . TOTAL KNEE ARTHROPLASTY Left 04/22/2017   Procedure: TOTAL KNEE ARTHROPLASTY;  Surgeon: Hessie Knows, MD;  Location: [facility] ORS;  Service: Orthopedics;  Laterality: Left;  . TRIGGER FINGER RELEASE Right   . TRIGGER FINGER RELEASE Right 04/29/2015   right long finger  ; Dr. Rudene Christians  . TUBAL LIGATION      Family History  Problem Relation Age of Onset  . Anemia Mother   . Alzheimer's disease Mother   . Osteoporosis Mother   . Heart attack Father   . Angina Father   . Alzheimer's disease Father   . Depression Father   . Alcohol abuse Father   . Colon polyps Father   . Diabetes Sister   . Heart disease Sister   .  Heart disease Brother   . Bipolar disorder Brother   . Diabetes Brother   . Heart disease Sister   . Cervical cancer Sister        in her 37's, S/P hysterectomy  . Dementia Sister   . Diabetes Brother   . Heart attack Brother   . Heart disease Brother   . Cancer Brother   . Stomach cancer Maternal Grandmother        COD  . Thyroid cancer Daughter   . Melanoma Daughter        stage III     Social History Social History  Substance Use Topics  . Smoking status: Never Smoker  . Smokeless tobacco: Never Used  . Alcohol use No  No IVDU  No Known Allergies  Current Outpatient Prescriptions  Medication Sig Dispense Refill  . Amino Acids-Protein Hydrolys (FEEDING SUPPLEMENT, PRO-STAT SUGAR FREE 64,) LIQD Take 30 mLs by mouth 2 (two) times  daily between meals.    Marland Kitchen aspirin EC 81 MG tablet Take 81 mg by mouth daily.    Marland Kitchen atenolol (TENORMIN) 25 MG tablet Take 25 mg by mouth every evening.   0  . atorvastatin (LIPITOR) 10 MG tablet Take 10 mg by mouth daily at 6 PM.   0  . Cyanocobalamin (RA VITAMIN B-12 TR) 1000 MCG TBCR Take 1 tablet by mouth 2 (two) times daily.     Marland Kitchen docusate sodium (COLACE) 100 MG capsule Take 100 mg by mouth daily.     . ferrous sulfate 325 (65 FE) MG tablet Take 325 mg by mouth daily with breakfast.     . insulin NPH-regular Human (NOVOLIN 70/30) (70-30) 100 UNIT/ML injection Inject 35-40 Units into the skin 2 (two) times daily with a meal. If blood sugar is 100- 120, 35 units, and  If above 150, it's 40 units     . lansoprazole (PREVACID) 15 MG capsule Take 15 mg by mouth 2 (two) times daily before a meal.     . levothyroxine (SYNTHROID, LEVOTHROID) 137 MCG tablet Take 137 mcg by mouth daily before breakfast.     . losartan (COZAAR) 100 MG tablet Take 100 mg by mouth daily.     . Melatonin 5 MG TABS Take 1 tablet by mouth at bedtime.    . metFORMIN (GLUCOPHAGE) 1000 MG tablet Take 1,000 mg by mouth 2 (two) times daily with a meal.     . nortriptyline (PAMELOR) 10 MG capsule Take 1 capsule (10 mg total) by mouth at bedtime. 30 capsule 2  . Nutritional Supplements (ENSURE ENLIVE PO) Take 1 Bottle by mouth 2 (two) times daily between meals.    Marland Kitchen oxyCODONE (OXY IR/ROXICODONE) 5 MG immediate release tablet Take 1-2 tablets (5-10 mg total) by mouth every 4 (four) hours as needed for breakthrough pain. 120 tablet 0  . Rivaroxaban (XARELTO) 15 MG TABS tablet Take 15 mg by mouth 2 (two) times daily with a meal.    . venlafaxine (EFFEXOR) 75 MG tablet Take 1 tablet (75 mg total) by mouth daily. 30 tablet 2  . magnesium hydroxide (MILK OF MAGNESIA) 400 MG/5ML suspension Take 30 mLs by mouth daily as needed for mild constipation. (Patient not taking: Reported on 05/14/2017) 360 mL 0   No current facility-administered  medications for this visit.       REVIEW OF SYSTEMS (Negative unless checked)  Constitutional: [] Weight loss  [] Fever  [] Chills Cardiac: [x] Chest pain   [] Chest pressure   [] Palpitations   [] Shortness  of breath when laying flat   [] Shortness of breath at rest   [x] Shortness of breath with exertion. Vascular:  [x] Pain in legs with walking   [x] Pain in legs at rest   [] Pain in legs when laying flat   [] Claudication   [] Pain in feet when walking  [] Pain in feet at rest  [] Pain in feet when laying flat   [x] History of DVT   [] Phlebitis   [x] Swelling in legs   [] Varicose veins   [] Non-healing ulcers Pulmonary:   [] Uses home oxygen   [] Productive cough   [] Hemoptysis   [] Wheeze  [] COPD   [] Asthma Neurologic:  [] Dizziness  [] Blackouts   [] Seizures   [] History of stroke   [] History of TIA  [] Aphasia   [] Temporary blindness   [] Dysphagia   [] Weakness or numbness in arms   [] Weakness or numbness in legs Musculoskeletal:  [x] Arthritis   [] Joint swelling   [] Joint pain   [] Low back pain Hematologic:  [] Easy bruising  [] Easy bleeding   [] Hypercoagulable state   [] Anemic  [] Hepatitis Gastrointestinal:  [] Blood in stool   [] Vomiting blood  [x] Gastroesophageal reflux/heartburn   [] Abdominal pain Genitourinary:  [] Chronic kidney disease   [] Difficult urination  [] Frequent urination  [] Burning with urination   [] Hematuria Skin:  [] Rashes   [] Ulcers   [] Wounds Psychological:  [] History of anxiety   []  History of major depression.    Physical Exam BP 137/76   Pulse 93   Resp 16   Ht 5' 5"  (1.651 m)   Wt 109.3 kg (241 lb)   BMI 40.10 kg/m  Gen:  WD/WN, NAD Head: [place]/AT, No temporalis wasting. Prominent temp pulse not noted. Ear/Nose/Throat: Hearing grossly intact, nares w/o erythema or drainage, oropharynx w/o Erythema/Exudate Eyes: Conjunctiva clear, sclera non-icteric  Neck: trachea midline.  No JVD.  Pulmonary:  Good air movement, respirations not labored, no use of accessory muscles Cardiac: RRR, no  JVD Vascular:  Vessel Right Left  Radial Palpable Palpable                          PT 1+ Palpable Not Palpable  DP Palpable Palpable   Gastrointestinal: soft, non-tender/non-distended.  Musculoskeletal: M/S 5/5 throughout.  Extremities without ischemic changes.  No deformity or atrophy. Trace right lower extremity edema, 1-2+ left lower extremity edema. Neurologic: Sensation grossly intact in extremities.  Symmetrical.  Speech is fluent. Motor exam as listed above. Psychiatric: Judgment intact, Mood & affect appropriate for pt's clinical situation. Dermatologic: Left knee surgical incision is healing well   Radiology Dg Knee 1-2 Views Left  Result Date: 04/22/2017 CLINICAL DATA:  Status post left total knee joint prosthesis placement. EXAM: LEFT KNEE - 1-2 VIEW COMPARISON:  CT scan of the left lower extremity of March 31, 2017 FINDINGS: The patient has undergone total left knee joint prosthesis placement. Radiographic positioning of the prosthetic components is good. The interface with the native bone appears normal. Skin staples are present. Tiny radiodense structures may reflect drainage catheters or bone stimulating electrodes over the distal thigh. IMPRESSION: No immediate postprocedure complication following left total knee joint prosthesis placement. Electronically Signed   By: David  Martinique M.D.   On: 04/22/2017 09:56    Labs Recent Results (from the past 2160 hour(s))  CBC     Status: Abnormal   Collection Time: 04/15/17 10:18 AM  Result Value Ref Range   WBC 8.2 3.6 - 11.0 K/uL   RBC 4.18 3.80 - 5.20 MIL/uL   Hemoglobin  11.4 (L) 12.0 - 16.0 g/dL   HCT 35.0 35.0 - 47.0 %   MCV 83.6 80.0 - 100.0 fL   MCH 27.4 26.0 - 34.0 pg   MCHC 32.7 32.0 - 36.0 g/dL   RDW 15.9 (H) 11.5 - 14.5 %   Platelets 277 150 - 440 K/uL  Sedimentation rate     Status: Abnormal   Collection Time: 04/15/17 10:18 AM  Result Value Ref Range   Sed Rate 38 (H) 0 - 30 mm/hr  Basic metabolic panel      Status: Abnormal   Collection Time: 04/15/17 10:18 AM  Result Value Ref Range   Sodium 139 135 - 145 mmol/L   Potassium 4.2 3.5 - 5.1 mmol/L   Chloride 103 101 - 111 mmol/L   CO2 28 22 - 32 mmol/L   Glucose, Bld 256 (H) 65 - 99 mg/dL   BUN 13 6 - 20 mg/dL   Creatinine, Ser 0.71 0.44 - 1.00 mg/dL   Calcium 8.9 8.9 - 10.3 mg/dL   GFR calc non Af Amer >60 >60 mL/min   GFR calc Af Amer >60 >60 mL/min    Comment: (NOTE) The eGFR has been calculated using the CKD EPI equation. This calculation has not been validated in all clinical situations. eGFR's persistently <60 mL/min signify possible Chronic Kidney Disease.    Anion gap 8 5 - 15  Protime-INR     Status: None   Collection Time: 04/15/17 10:18 AM  Result Value Ref Range   Prothrombin Time 13.7 11.4 - 15.2 seconds   INR 1.05   APTT     Status: None   Collection Time: 04/15/17 10:18 AM  Result Value Ref Range   aPTT 31 24 - 36 seconds  Urinalysis, Complete w Microscopic     Status: Abnormal   Collection Time: 04/15/17 10:18 AM  Result Value Ref Range   Color, Urine YELLOW (A) YELLOW   APPearance CLEAR (A) CLEAR   Specific Gravity, Urine 1.017 1.005 - 1.030   pH 5.0 5.0 - 8.0   Glucose, UA 150 (A) NEGATIVE mg/dL   Hgb urine dipstick MODERATE (A) NEGATIVE   Bilirubin Urine NEGATIVE NEGATIVE   Ketones, ur 5 (A) NEGATIVE mg/dL   Protein, ur NEGATIVE NEGATIVE mg/dL   Nitrite NEGATIVE NEGATIVE   Leukocytes, UA TRACE (A) NEGATIVE   RBC / HPF TOO NUMEROUS TO COUNT 0 - 5 RBC/hpf   WBC, UA 0-5 0 - 5 WBC/hpf   Bacteria, UA NONE SEEN NONE SEEN   Squamous Epithelial / LPF 0-5 (A) NONE SEEN   Mucus PRESENT   Urine culture     Status: None   Collection Time: 04/15/17 10:18 AM  Result Value Ref Range   Specimen Description URINE, CLEAN CATCH    Special Requests NONE    Culture      NO GROWTH Performed at [facility], [address], [place], [place] [address]    Report Status 04/16/2017 FINAL   Surgical pcr screen      Status: None   Collection Time: 04/15/17 10:18 AM  Result Value Ref Range   MRSA, PCR NEGATIVE NEGATIVE   Staphylococcus aureus NEGATIVE NEGATIVE    Comment:        The Xpert SA Assay (FDA approved for NASAL specimens in patients over 60 years of age), is one component of a comprehensive surveillance program.  Test performance has been validated by [facility] for patients greater than or equal to 1 year  old. It is not intended to diagnose infection nor to guide or monitor treatment.   Type and screen [place]     Status: None   Collection Time: 04/15/17 10:18 AM  Result Value Ref Range   ABO/RH(D) A POS    Antibody Screen NEG    Sample Expiration 04/29/2017    Extend sample reason NO TRANSFUSIONS OR PREGNANCY IN THE PAST 3 MONTHS   Glucose, capillary     Status: Abnormal   Collection Time: 04/22/17  6:27 AM  Result Value Ref Range   Glucose-Capillary 228 (H) 65 - 99 mg/dL  ABO/Rh     Status: None   Collection Time: 04/22/17  6:56 AM  Result Value Ref Range   ABO/RH(D) A POS   Glucose, capillary     Status: Abnormal   Collection Time: 04/22/17  9:33 AM  Result Value Ref Range   Glucose-Capillary 229 (H) 65 - 99 mg/dL  Glucose, capillary     Status: Abnormal   Collection Time: 04/22/17 11:27 AM  Result Value Ref Range   Glucose-Capillary 204 (H) 65 - 99 mg/dL  CBC     Status: Abnormal   Collection Time: 04/22/17 11:30 AM  Result Value Ref Range   WBC 8.8 3.6 - 11.0 K/uL   RBC 4.18 3.80 - 5.20 MIL/uL   Hemoglobin 11.5 (L) 12.0 - 16.0 g/dL   HCT 35.0 35.0 - 47.0 %   MCV 83.8 80.0 - 100.0 fL   MCH 27.4 26.0 - 34.0 pg   MCHC 32.7 32.0 - 36.0 g/dL   RDW 15.9 (H) 11.5 - 14.5 %   Platelets 280 150 - 440 K/uL  Creatinine, serum     Status: None   Collection Time: 04/22/17 11:30 AM  Result Value Ref Range   Creatinine, Ser 0.59 0.44 - 1.00 mg/dL   GFR calc non Af Amer >60 >60 mL/min   GFR calc Af Amer >60 >60 mL/min    Comment: (NOTE) The  eGFR has been calculated using the CKD EPI equation. This calculation has not been validated in all clinical situations. eGFR's persistently <60 mL/min signify possible Chronic Kidney Disease.   Glucose, capillary     Status: Abnormal   Collection Time: 04/22/17  3:38 PM  Result Value Ref Range   Glucose-Capillary 196 (H) 65 - 99 mg/dL  Glucose, capillary     Status: Abnormal   Collection Time: 04/22/17  9:07 PM  Result Value Ref Range   Glucose-Capillary 211 (H) 65 - 99 mg/dL   Comment 1 Notify RN   CBC     Status: Abnormal   Collection Time: 04/23/17  2:57 AM  Result Value Ref Range   WBC 11.1 (H) 3.6 - 11.0 K/uL   RBC 4.04 3.80 - 5.20 MIL/uL   Hemoglobin 11.3 (L) 12.0 - 16.0 g/dL   HCT 34.0 (L) 35.0 - 47.0 %   MCV 84.2 80.0 - 100.0 fL   MCH 28.0 26.0 - 34.0 pg   MCHC 33.3 32.0 - 36.0 g/dL   RDW 15.8 (H) 11.5 - 14.5 %   Platelets 275 150 - 440 K/uL  Basic metabolic panel     Status: Abnormal   Collection Time: 04/23/17  2:57 AM  Result Value Ref Range   Sodium 132 (L) 135 - 145 mmol/L   Potassium 4.4 3.5 - 5.1 mmol/L   Chloride 98 (L) 101 - 111 mmol/L   CO2 26 22 - 32 mmol/L   Glucose, Bld 285 (  H) 65 - 99 mg/dL   BUN 7 6 - 20 mg/dL   Creatinine, Ser 0.59 0.44 - 1.00 mg/dL   Calcium 8.2 (L) 8.9 - 10.3 mg/dL   GFR calc non Af Amer >60 >60 mL/min   GFR calc Af Amer >60 >60 mL/min    Comment: (NOTE) The eGFR has been calculated using the CKD EPI equation. This calculation has not been validated in all clinical situations. eGFR's persistently <60 mL/min signify possible Chronic Kidney Disease.    Anion gap 8 5 - 15  Glucose, capillary     Status: Abnormal   Collection Time: 04/23/17  7:50 AM  Result Value Ref Range   Glucose-Capillary 296 (H) 65 - 99 mg/dL  Glucose, capillary     Status: Abnormal   Collection Time: 04/23/17 11:22 AM  Result Value Ref Range   Glucose-Capillary 316 (H) 65 - 99 mg/dL  Glucose, capillary     Status: Abnormal   Collection Time: 04/23/17   4:12 PM  Result Value Ref Range   Glucose-Capillary 338 (H) 65 - 99 mg/dL  Glucose, capillary     Status: Abnormal   Collection Time: 04/23/17  9:17 PM  Result Value Ref Range   Glucose-Capillary 258 (H) 65 - 99 mg/dL   Comment 1 Notify RN   CBC     Status: Abnormal   Collection Time: 04/24/17  3:16 AM  Result Value Ref Range   WBC 13.6 (H) 3.6 - 11.0 K/uL   RBC 3.96 3.80 - 5.20 MIL/uL   Hemoglobin 11.0 (L) 12.0 - 16.0 g/dL   HCT 33.2 (L) 35.0 - 47.0 %   MCV 83.8 80.0 - 100.0 fL   MCH 27.9 26.0 - 34.0 pg   MCHC 33.3 32.0 - 36.0 g/dL   RDW 15.5 (H) 11.5 - 14.5 %   Platelets 295 150 - 440 K/uL  Basic metabolic panel     Status: Abnormal   Collection Time: 04/24/17  3:16 AM  Result Value Ref Range   Sodium 133 (L) 135 - 145 mmol/L   Potassium 4.7 3.5 - 5.1 mmol/L   Chloride 99 (L) 101 - 111 mmol/L   CO2 27 22 - 32 mmol/L   Glucose, Bld 273 (H) 65 - 99 mg/dL   BUN 10 6 - 20 mg/dL   Creatinine, Ser 0.61 0.44 - 1.00 mg/dL   Calcium 8.7 (L) 8.9 - 10.3 mg/dL   GFR calc non Af Amer >60 >60 mL/min   GFR calc Af Amer >60 >60 mL/min    Comment: (NOTE) The eGFR has been calculated using the CKD EPI equation. This calculation has not been validated in all clinical situations. eGFR's persistently <60 mL/min signify possible Chronic Kidney Disease.    Anion gap 7 5 - 15  Glucose, capillary     Status: Abnormal   Collection Time: 04/24/17  7:12 AM  Result Value Ref Range   Glucose-Capillary 271 (H) 65 - 99 mg/dL   Comment 1 Notify RN   Glucose, capillary     Status: Abnormal   Collection Time: 04/24/17 11:42 AM  Result Value Ref Range   Glucose-Capillary 288 (H) 65 - 99 mg/dL   Comment 1 Notify RN   Glucose, capillary     Status: Abnormal   Collection Time: 04/24/17  4:45 PM  Result Value Ref Range   Glucose-Capillary 220 (H) 65 - 99 mg/dL   Comment 1 Notify RN   Glucose, capillary     Status: Abnormal  Collection Time: 04/24/17  9:50 PM  Result Value Ref Range    Glucose-Capillary 157 (H) 65 - 99 mg/dL   Comment 1 Notify RN   Glucose, capillary     Status: Abnormal   Collection Time: 04/25/17  7:14 AM  Result Value Ref Range   Glucose-Capillary 218 (H) 65 - 99 mg/dL   Comment 1 Notify RN   Glucose, capillary     Status: Abnormal   Collection Time: 04/25/17 11:32 AM  Result Value Ref Range   Glucose-Capillary 162 (H) 65 - 99 mg/dL   Comment 1 Notify RN   CBC with Differential/Platelet     Status: Abnormal   Collection Time: 04/27/17  4:00 AM  Result Value Ref Range   WBC 12.9 (H) 3.6 - 11.0 K/uL   RBC 3.52 (L) 3.80 - 5.20 MIL/uL   Hemoglobin 9.7 (L) 12.0 - 16.0 g/dL   HCT 29.5 (L) 35.0 - 47.0 %   MCV 83.7 80.0 - 100.0 fL   MCH 27.4 26.0 - 34.0 pg   MCHC 32.8 32.0 - 36.0 g/dL   RDW 15.9 (H) 11.5 - 14.5 %   Platelets 352 150 - 440 K/uL   Neutrophils Relative % 58 %   Neutro Abs 7.5 (H) 1.4 - 6.5 K/uL   Lymphocytes Relative 30 %   Lymphs Abs 3.8 (H) 1.0 - 3.6 K/uL   Monocytes Relative 8 %   Monocytes Absolute 1.1 (H) 0.2 - 0.9 K/uL   Eosinophils Relative 3 %   Eosinophils Absolute 0.3 0 - 0.7 K/uL   Basophils Relative 1 %   Basophils Absolute 0.1 0 - 0.1 K/uL  Comprehensive metabolic panel     Status: Abnormal   Collection Time: 04/27/17  4:00 AM  Result Value Ref Range   Sodium 134 (L) 135 - 145 mmol/L   Potassium 4.0 3.5 - 5.1 mmol/L   Chloride 100 (L) 101 - 111 mmol/L   CO2 25 22 - 32 mmol/L   Glucose, Bld 170 (H) 65 - 99 mg/dL   BUN 13 6 - 20 mg/dL   Creatinine, Ser 0.75 0.44 - 1.00 mg/dL   Calcium 8.2 (L) 8.9 - 10.3 mg/dL   Total Protein 6.3 (L) 6.5 - 8.1 g/dL   Albumin 3.0 (L) 3.5 - 5.0 g/dL   AST 17 15 - 41 U/L   ALT 19 14 - 54 U/L   Alkaline Phosphatase 77 38 - 126 U/L   Total Bilirubin 0.7 0.3 - 1.2 mg/dL   GFR calc non Af Amer >60 >60 mL/min   GFR calc Af Amer >60 >60 mL/min    Comment: (NOTE) The eGFR has been calculated using the CKD EPI equation. This calculation has not been validated in all clinical  situations. eGFR's persistently <60 mL/min signify possible Chronic Kidney Disease.    Anion gap 9 5 - 15  CBC with Differential/Platelet     Status: Abnormal   Collection Time: 04/28/17  1:44 PM  Result Value Ref Range   WBC 12.9 (H) 3.6 - 11.0 K/uL   RBC 3.65 (L) 3.80 - 5.20 MIL/uL   Hemoglobin 10.6 (L) 12.0 - 16.0 g/dL   HCT 30.8 (L) 35.0 - 47.0 %   MCV 84.5 80.0 - 100.0 fL   MCH 29.1 26.0 - 34.0 pg   MCHC 34.4 32.0 - 36.0 g/dL   RDW 15.8 (H) 11.5 - 14.5 %   Platelets 406 150 - 440 K/uL   Neutrophils Relative % 62 %  Neutro Abs 8.0 (H) 1.4 - 6.5 K/uL   Lymphocytes Relative 28 %   Lymphs Abs 3.5 1.0 - 3.6 K/uL   Monocytes Relative 7 %   Monocytes Absolute 0.9 0.2 - 0.9 K/uL   Eosinophils Relative 2 %   Eosinophils Absolute 0.3 0 - 0.7 K/uL   Basophils Relative 1 %   Basophils Absolute 0.1 0 - 0.1 K/uL    Assessment/Plan:  Benign essential HTN blood pressure control important in reducing the progression of atherosclerotic disease. On appropriate oral medications.   Type 2 diabetes mellitus treated with insulin (HCC) blood glucose control important in reducing the progression of atherosclerotic disease. Also, involved in wound healing. On appropriate medications.   Pure hypercholesterolemia lipid control important in reducing the progression of atherosclerotic disease. Continue statin therapy   DVT (deep venous thrombosis) (HCC) Recommend:   No surgery or intervention at this point in time.  IVC filter is not indicated at present.  Patient's duplex ultrasound of the venous system shows DVT from the popliteal to the deep femoral veins.  The patient is already initiated on anticoagulation   Elevation was stressed, use of a recliner was discussed.  I have had a long discussion with the patient regarding DVT and post phlebitic changes such as swelling and why it  causes symptoms such as pain.  The patient will wear graduated compression stockings class 1 (20-30  mmHg), beginning after three full days of anticoagulation, on a daily basis a prescription was given. The patient will  beginning wearing the stockings first thing in the morning and removing them in the evening. The patient is instructed specifically not to sleep in the stockings.  In addition, behavioral modification including elevation during the day and avoidance of prolonged dependency will be initiated.  Increasing her activity would also be recommended.  The patient will continue anticoagulation for now as there have not been any problems or complications at this point.   RTC 5 months to discuss cessation of anticoagulation.       Leotis Pain 05/14/2017, 11:27 AM   This note was created with Dragon medical transcription system.  Any errors from dictation are unintentional.

## 2017-05-14 NOTE — Patient Instructions (Signed)
Deep Vein Thrombosis A deep vein thrombosis (DVT) is a blood clot (thrombus) that usually occurs in a deep, larger vein of the lower leg or the pelvis, or in an upper extremity such as the arm. These are dangerous and can lead to serious and even life-threatening complications if the clot travels to the lungs. A DVT can damage the valves in your leg veins so that instead of flowing upward, the blood pools in the lower leg. This is called post-thrombotic syndrome, and it can result in pain, swelling, discoloration, and sores on the leg. What are the causes? A DVT is caused by the formation of a blood clot in your leg, pelvis, or arm. Usually, several things contribute to the formation of blood clots. A clot may develop when:  Your blood flow slows down.  Your vein becomes damaged in some way.  You have a condition that makes your blood clot more easily.  What increases the risk? A DVT is more likely to develop in:  People who are older, especially over 60 years of age.  People who are overweight (obese).  People who sit or lie still for a long time, such as during long-distance travel (over 4 hours), bed rest, hospitalization, or during recovery from certain medical conditions like a stroke.  People who do not engage in much physical activity (sedentary lifestyle).  People who have chronic breathing disorders.  People who have a personal or family history of blood clots or blood clotting disease.  People who have peripheral vascular disease (PVD), diabetes, or some types of cancer.  People who have heart disease, especially if the person had a recent heart attack or has congestive heart failure.  People who have neurological diseases that affect the legs (leg paresis).  People who have had a traumatic injury, such as breaking a hip or leg.  People who have recently had major or lengthy surgery, especially on the hip, knee, or abdomen.  People who have had a central line placed  inside a large vein.  People who take medicines that contain the hormone estrogen. These include birth control pills and hormone replacement therapy.  Pregnancy or during childbirth or the postpartum period.  Long plane flights (over 8 hours).  What are the signs or symptoms?  Symptoms of a DVT can include:  Swelling of your leg or arm, especially if one side is much worse.  Warmth and redness of your leg or arm, especially if one side is much worse.  Pain in your arm or leg. If the clot is in your leg, symptoms may be more noticeable or worse when you stand or walk.  A feeling of pins and needles, if the clot is in the arm.  The symptoms of a DVT that has traveled to the lungs (pulmonary embolism, PE) usually start suddenly and include:  Shortness of breath while active or at rest.  Coughing or coughing up blood or blood-tinged mucus.  Chest pain that is often worse with deep breaths.  Rapid or irregular heartbeat.  Feeling light-headed or dizzy.  Fainting.  Feeling anxious.  Sweating.  There may also be pain and swelling in a leg if that is where the blood clot started. These symptoms may represent a serious problem that is an emergency. Do not wait to see if the symptoms will go away. Get medical help right away. Call your local emergency services (911 in the [place] Do not drive yourself to the hospital. How is this diagnosed? Your health   care provider will take a medical history and perform a physical exam. You may also have other tests, including:  Blood tests to assess the clotting properties of your blood.  Imaging tests, such as CT, ultrasound, MRI, X-ray, and other tests to see if you have clots anywhere in your body.  How is this treated? After a DVT is identified, it can be treated. The type of treatment that you receive depends on many factors, such as the cause of your DVT, your risk for bleeding or developing more clots, and other medical conditions that  you have. Sometimes, a combination of treatments is necessary. Treatment options may be combined and include:  Monitoring the blood clot with ultrasound.  Taking medicines by mouth, such as newer blood thinners (anticoagulants), thrombolytics, or warfarin.  Taking anticoagulant medicine by injection or through an IV tube.  Wearing compression stockings or using different types ofdevices.  Surgery (rare) to remove the blood clot or to place a filter in your abdomen to stop the blood clot from traveling to your lungs.  Treatments for a DVT are often divided into immediate treatment and long-term treatment (up to 3 months after DVT). You can work with your health care provider to choose the treatment program that is best for you. Follow these instructions at home: If you are taking a newer oral anticoagulant:  Take the medicine every single day at the same time each day.  Understand what foods and drugs interact with this medicine.  Understand that there are no regular blood tests required when using this medicine.  Understand the side effects of this medicine, including excessive bruising or bleeding. Ask your health care provider or pharmacist about other possible side effects. If you are taking warfarin:  Understand how to take warfarin and know which foods can affect how warfarin works in your body.  Understand that it is dangerous to take too much or too little warfarin. Too much warfarin increases the risk of bleeding. Too little warfarin continues to allow the risk for blood clots.  Follow your PT and INR blood testing schedule. The PT and INR results allow your health care provider to adjust your dose of warfarin. It is very important that you have your PT and INR tested as often as told by your health care provider.  Avoid major changes in your diet, or tell your health care provider before you change your diet. Arrange a visit with a registered dietitian to answer your  questions. Many foods, especially foods that are high in vitamin K, can interfere with warfarin and affect the PT and INR results. Eat a consistent amount of foods that are high in vitamin K, such as: ? Spinach, kale, broccoli, cabbage, collard greens, turnip greens, Brussels sprouts, peas, cauliflower, seaweed, and parsley. ? Beef liver and pork liver. ? Green tea. ? Soybean oil.  Tell your health care provider about any and all medicines, vitamins, and supplements that you take, including aspirin and other over-the-counter anti-inflammatory medicines. Be especially cautious with aspirin and anti-inflammatory medicines. Do not take those before you ask your health care provider if it is safe to do so. This is important because many medicines can interfere with warfarin and affect the PT and INR results.  Do not start or stop taking any over-the-counter or prescription medicine unless your health care provider or pharmacist tells you to do so. If you take warfarin, you will also need to do these things:  Hold pressure over cuts for longer than   usual.  Tell your dentist and other health care providers that you are taking warfarin before you have any procedures in which bleeding may occur.  Avoid alcohol or drink very small amounts. Tell your health care provider if you change your alcohol intake.  Do not use tobacco products, including cigarettes, chewing tobacco, and e-cigarettes. If you need help quitting, ask your health care provider.  Avoid contact sports.  General instructions  Take over-the-counter and prescription medicines only as told by your health care provider. Anticoagulant medicines can have side effects, including easy bruising and difficulty stopping bleeding. If you are prescribed an anticoagulant, you will also need to do these things: ? Hold pressure over cuts for longer than usual. ? Tell your dentist and other health care providers that you are taking anticoagulants  before you have any procedures in which bleeding may occur. ? Avoid contact sports.  Wear a medical alert bracelet or carry a medical alert card that says you have had a PE.  Ask your health care provider how soon you can go back to your normal activities. Stay active to prevent new blood clots from forming.  Make sure to exercise while traveling or when you have been sitting or standing for a long period of time. It is very important to exercise. Exercise your legs by walking or by tightening and relaxing your leg muscles often. Take frequent walks.  Wear compression stockings as told by your health care provider to help prevent more blood clots from forming.  Do not use tobacco products, including cigarettes, chewing tobacco, and e-cigarettes. If you need help quitting, ask your health care provider.  Keep all follow-up appointments with your health care provider. This is important. How is this prevented? Take these actions to decrease your risk of developing another DVT:  Exercise regularly. For at least 30 minutes every day, engage in: ? Activity that involves moving your arms and legs. ? Activity that encourages good blood flow through your body by increasing your heart rate.  Exercise your arms and legs every hour during long-distance travel (over 4 hours). Drink plenty of water and avoid drinking alcohol while traveling.  Avoid sitting or lying in bed for long periods of time without moving your legs.  Maintain a weight that is appropriate for your height. Ask your health care provider what weight is healthy for you.  If you are a woman who is over 35 years of age, avoid unnecessary use of medicines that contain estrogen. These include birth control pills.  Do not smoke, especially if you take estrogen medicines. If you need help quitting, ask your health care provider.  If you are hospitalized, prevention measures may include:  Early walking after surgery, as soon as your  health care provider says that it is safe.  Receiving anticoagulants to prevent blood clots.If you cannot take anticoagulants, other options may be available, such as wearing compression stockings or using different types of devices.  Get help right away if:  You have new or increased pain, swelling, or redness in an arm or leg.  You have numbness or tingling in an arm or leg.  You have shortness of breath while active or at rest.  You have chest pain.  You have a rapid or irregular heartbeat.  You feel light-headed or dizzy.  You cough up blood.  You notice blood in your vomit, bowel movement, or urine. These symptoms may represent a serious problem that is an emergency. Do not wait to see   if the symptoms will go away. Get medical help right away. Call your local emergency services (911 in the [place] Do not drive yourself to the hospital. This information is not intended to replace advice given to you by your health care provider. Make sure you discuss any questions you have with your health care provider. Document Released: 08/17/2005 Document Revised: 01/23/2016 Document Reviewed: 12/12/2014 Elsevier Interactive Patient Education  2017 [employer]  

## 2017-05-14 NOTE — Assessment & Plan Note (Signed)
blood pressure control important in reducing the progression of atherosclerotic disease. On appropriate oral medications.  

## 2017-05-14 NOTE — Assessment & Plan Note (Signed)
lipid control important in reducing the progression of atherosclerotic disease. Continue statin therapy  

## 2017-05-14 NOTE — Assessment & Plan Note (Signed)
Recommend:   No surgery or intervention at this point in time.  IVC filter is not indicated at present.  Patient's duplex ultrasound of the venous system shows DVT from the popliteal to the deep femoral veins.  The patient is already initiated on anticoagulation   Elevation was stressed, use of a recliner was discussed.  I have had a long discussion with the patient regarding DVT and post phlebitic changes such as swelling and why it  causes symptoms such as pain.  The patient will wear graduated compression stockings class 1 (20-30 mmHg), beginning after three full days of anticoagulation, on a daily basis a prescription was given. The patient will  beginning wearing the stockings first thing in the morning and removing them in the evening. The patient is instructed specifically not to sleep in the stockings.  In addition, behavioral modification including elevation during the day and avoidance of prolonged dependency will be initiated.  Increasing her activity would also be recommended.  The patient will continue anticoagulation for now as there have not been any problems or complications at this point.   RTC 5 months to discuss cessation of anticoagulation.

## 2017-06-17 ENCOUNTER — Telehealth (INDEPENDENT_AMBULATORY_CARE_PROVIDER_SITE_OTHER): Payer: Self-pay | Admitting: Vascular Surgery

## 2017-06-17 NOTE — Telephone Encounter (Signed)
PATIENT STATES SHE IS SUPPOSE TO ONLY WORK FOR 4HOURS A DAY BUT SHE NEEDS TO KNOW FOR HOW LONG?  UNTIL NEXT APPT OR JUST FOR 21 DAYS?

## 2017-06-17 NOTE — Telephone Encounter (Signed)
Called the patientto let her know that Dr. 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

## 2017-10-15 ENCOUNTER — Encounter (INDEPENDENT_AMBULATORY_CARE_PROVIDER_SITE_OTHER): Payer: Self-pay | Admitting: Vascular Surgery

## 2017-10-15 ENCOUNTER — Ambulatory Visit (INDEPENDENT_AMBULATORY_CARE_PROVIDER_SITE_OTHER): Payer: Medicare Other | Admitting: Vascular Surgery

## 2017-10-15 VITALS — BP 148/75 | HR 97 | Resp 16 | Wt 240.0 lb

## 2017-10-15 DIAGNOSIS — I82512 Chronic embolism and thrombosis of left femoral vein: Secondary | ICD-10-CM | POA: Diagnosis not present

## 2017-10-15 DIAGNOSIS — E119 Type 2 diabetes mellitus without complications: Secondary | ICD-10-CM | POA: Diagnosis not present

## 2017-10-15 DIAGNOSIS — I10 Essential (primary) hypertension: Secondary | ICD-10-CM

## 2017-10-15 NOTE — Progress Notes (Signed)
MRN : [id number]  Gina LevelsSylvia M Tate is a 67 y.o. (07/20/1951) female who presents with chief complaint of  Chief Complaint  Patient presents with  . Follow-up    5 month follow up  .  History of Present Illness: Patient returns today in follow up of DVT of the left lower extremity.  She is almost 6 months into her treatment with anticoagulation.  She has had near resolution of the swelling and improvement in the pain.  She does still have some pain in that leg as well as some numbness after her knee replacement, but all in all the leg is doing pretty well.  No new ulceration or infection.  Past Medical History:  Diagnosis Date  . Atypical chest pain   . Dementia   . Depression    unspecified  . Diabetes mellitus type 2, uncomplicated (HCC)   . Diabetes mellitus, type II (HCC)   . Dyspnea   . Dysrhythmia   . Elevated transaminase level    chronic, probably on basis of steatorrhea syndrome  . Fracture of right patella    not operated  . GERD (gastroesophageal reflux disease)   . Headache   . Headache, variant migraine   . History of cervical dysplasia    status post two normal pap smears, 02/2002 and 05/2002, status post LEEP  . History of hiatal hernia   . Hypercholesteremia   . Hyperlipidemia, unspecified   . Hypertension   . Hypothyroidism   . Morton's neuroma, left   . Obesity, unspecified   . Osteoarthritis   . Thyroid disease   . TIA (transient ischemic attack)          Past Surgical History:  Procedure Laterality Date  . BACK SURGERY    . CERVICAL BIOPSY  W/ LOOP ELECTRODE EXCISION  12/07/2001  . ESOPHAGOGASTRODUODENOSCOPY N/A 10/07/2015   Procedure: ESOPHAGOGASTRODUODENOSCOPY (EGD);  Surgeon: Wallace CullensPaul Y Oh, MD;  Location[facility];  Service: Gastroenterology;  Laterality: N/A;  . ESOPHAGOGASTRODUODENOSCOPY  04/28/1993   Revealed moderate grade 2 distal esophagitis and small, intermittent hiatal hernia.   Marland Kitchen. HAND SURGERY  Right   . SPINE SURGERY    . TONSILLECTOMY    . TOTAL KNEE ARTHROPLASTY Left 04/22/2017   Procedure: TOTAL KNEE ARTHROPLASTY;  Surgeon: Kennedy BuckerMenz, Michael, MD;  Location: [facility] ORS;  Service: Orthopedics;  Laterality: Left;  . TRIGGER FINGER RELEASE Right   . TRIGGER FINGER RELEASE Right 04/29/2015   right long finger  ; Dr. Rosita KeaMenz  . TUBAL LIGATION           Family History  Problem Relation Age of Onset  . Anemia Mother   . Alzheimer's disease Mother   . Osteoporosis Mother   . Heart attack Father   . Angina Father   . Alzheimer's disease Father   . Depression Father   . Alcohol abuse Father   . Colon polyps Father   . Diabetes Sister   . Heart disease Sister   . Heart disease Brother   . Bipolar disorder Brother   . Diabetes Brother   . Heart disease Sister   . Cervical cancer Sister        in her 3520's, S/P hysterectomy  . Dementia Sister   . Diabetes Brother   . Heart attack Brother   . Heart disease Brother   . Cancer Brother   . Stomach cancer Maternal Grandmother        COD  . Thyroid cancer Daughter   .  Melanoma Daughter        stage III     Social History     Social History  Substance Use Topics  . Smoking status: Never Smoker  . Smokeless tobacco: Never Used  . Alcohol use No  No IVDU  No Known Allergies        Current Outpatient Prescriptions  Medication Sig Dispense Refill  . Amino Acids-Protein Hydrolys (FEEDING SUPPLEMENT, PRO-STAT SUGAR FREE 64,) LIQD Take 30 mLs by mouth 2 (two) times daily between meals.    Marland Kitchen aspirin EC 81 MG tablet Take 81 mg by mouth daily.    Marland Kitchen atenolol (TENORMIN) 25 MG tablet Take 25 mg by mouth every evening.   0  . atorvastatin (LIPITOR) 10 MG tablet Take 10 mg by mouth daily at 6 PM.   0  . Cyanocobalamin (RA VITAMIN B-12 TR) 1000 MCG TBCR Take 1 tablet by mouth 2 (two) times daily.     Marland Kitchen docusate sodium (COLACE) 100 MG capsule Take 100 mg by mouth daily.     . ferrous  sulfate 325 (65 FE) MG tablet Take 325 mg by mouth daily with breakfast.     . insulin NPH-regular Human (NOVOLIN 70/30) (70-30) 100 UNIT/ML injection Inject 35-40 Units into the skin 2 (two) times daily with a meal. If blood sugar is 100- 120, 35 units, and  If above 150, it's 40 units     . lansoprazole (PREVACID) 15 MG capsule Take 15 mg by mouth 2 (two) times daily before a meal.     . levothyroxine (SYNTHROID, LEVOTHROID) 137 MCG tablet Take 137 mcg by mouth daily before breakfast.     . losartan (COZAAR) 100 MG tablet Take 100 mg by mouth daily.     . Melatonin 5 MG TABS Take 1 tablet by mouth at bedtime.    . metFORMIN (GLUCOPHAGE) 1000 MG tablet Take 1,000 mg by mouth 2 (two) times daily with a meal.     . nortriptyline (PAMELOR) 10 MG capsule Take 1 capsule (10 mg total) by mouth at bedtime. 30 capsule 2  . Nutritional Supplements (ENSURE ENLIVE PO) Take 1 Bottle by mouth 2 (two) times daily between meals.    Marland Kitchen oxyCODONE (OXY IR/ROXICODONE) 5 MG immediate release tablet Take 1-2 tablets (5-10 mg total) by mouth every 4 (four) hours as needed for breakthrough pain. 120 tablet 0  . Rivaroxaban (XARELTO) 15 MG TABS tablet Take 15 mg by mouth 2 (two) times daily with a meal.    . venlafaxine (EFFEXOR) 75 MG tablet Take 1 tablet (75 mg total) by mouth daily. 30 tablet 2  . magnesium hydroxide (MILK OF MAGNESIA) 400 MG/5ML suspension Take 30 mLs by mouth daily as needed for mild constipation. (Patient not taking: Reported on 05/14/2017) 360 mL 0   No current facility-administered medications for this visit.       REVIEW OF SYSTEMS (Negative unless checked)  Constitutional: [] Weight loss  [] Fever  [] Chills Cardiac: [x] Chest pain   [] Chest pressure   [] Palpitations   [] Shortness of breath when laying flat   [] Shortness of breath at rest   [x] Shortness of breath with exertion. Vascular:  [x] Pain in legs with walking   [x] Pain in legs at rest   [] Pain in legs when laying  flat   [] Claudication   [] Pain in feet when walking  [] Pain in feet at rest  [] Pain in feet when laying flat   [x] History of DVT   [] Phlebitis   [x] Swelling in legs   []   Varicose veins   [] Non-healing ulcers Pulmonary:   [] Uses home oxygen   [] Productive cough   [] Hemoptysis   [] Wheeze  [] COPD   [] Asthma Neurologic:  [] Dizziness  [] Blackouts   [] Seizures   [] History of stroke   [] History of TIA  [] Aphasia   [] Temporary blindness   [] Dysphagia   [] Weakness or numbness in arms   [] Weakness or numbness in legs Musculoskeletal:  [x] Arthritis   [] Joint swelling   [] Joint pain   [] Low back pain Hematologic:  [] Easy bruising  [] Easy bleeding   [] Hypercoagulable state   [] Anemic  [] Hepatitis Gastrointestinal:  [] Blood in stool   [] Vomiting blood  [x] Gastroesophageal reflux/heartburn   [] Abdominal pain Genitourinary:  [] Chronic kidney disease   [] Difficult urination  [] Frequent urination  [] Burning with urination   [] Hematuria Skin:  [] Rashes   [] Ulcers   [] Wounds Psychological:  [] History of anxiety   []  History of major depression.     Physical Examination  BP (!) 148/75 (BP Location: Left Arm)   Pulse 97   Resp 16   Wt 108.9 kg (240 lb)   BMI 39.94 kg/m  Gen:  WD/WN, NAD Head: [place]/AT, No temporalis wasting. Ear/Nose/Throat: Hearing grossly intact, nares w/o erythema or drainage, trachea midline Eyes: Conjunctiva clear. Sclera non-icteric Neck: Supple.  No JVD.  Pulmonary:  Good air movement, no use of accessory muscles.  Cardiac: RRR, normal S1, S2 Vascular:  Vessel Right Left  Radial Palpable Palpable                          PT Palpable Palpable   Musculoskeletal: M/S 5/5 throughout.  No deformity or atrophy.  Trace left lower extremity edema. Neurologic: Sensation grossly intact in extremities.  Symmetrical.  Speech is fluent.  Psychiatric: Judgment intact, Mood & affect appropriate for pt's clinical situation. Dermatologic: No rashes or ulcers noted.  No cellulitis or open  wounds.       Labs No results found for this or any previous visit (from the past 2160 hour(s)).  Radiology No results found.    Assessment/Plan Benign essential HTN blood pressure control important in reducing the progression of atherosclerotic disease. On appropriate oral medications.   Type 2 diabetes mellitus treated with insulin (HCC) blood glucose control important in reducing the progression of atherosclerotic disease. Also, involved in wound healing. On appropriate medications.   Pure hypercholesterolemia lipid control important in reducing the progression of atherosclerotic disease. Continue statin therapy    DVT (deep venous thrombosis) (HCC) She is now about 6 months status post left lower extremity DVT.  She has tolerated an appropriate course of anticoagulation well.  She can stop her anticoagulation when her current supply runs out.  She should take a baby aspirin daily.  She should elevate her legs, continue activity as tolerated, and use compression stockings as needed for postphlebitic pain or swelling.  I will see her back as needed.    Festus Barren, MD  10/15/2017 10:39 AM    This note was created with Dragon medical transcription system.  Any errors from dictation are purely unintentional

## 2017-10-15 NOTE — Assessment & Plan Note (Signed)
She is now about 6 months status post left lower extremity DVT.  She has tolerated an appropriate course of anticoagulation well.  She can stop her anticoagulation when her current supply runs out.  She should take a baby aspirin daily.  She should elevate her legs, continue activity as tolerated, and use compression stockings as needed for postphlebitic pain or swelling.  I will see her back as needed.

## 2018-02-02 ENCOUNTER — Encounter: Payer: Self-pay | Admitting: *Deleted

## 2018-02-15 ENCOUNTER — Encounter
Admission: RE | Disposition: A | Discharge status: Home / Self Care | Payer: Self-pay | Source: Ambulatory Visit | Attending: Ophthalmology

## 2018-02-15 ENCOUNTER — Ambulatory Visit
Admission: RE | Admit: 2018-02-15 | Discharge: 2018-02-15 | Disposition: A | Discharge status: Home / Self Care | Payer: Medicare Other | Source: Ambulatory Visit | Attending: Ophthalmology | Admitting: Ophthalmology

## 2018-02-15 ENCOUNTER — Encounter: Payer: Self-pay | Admitting: Anesthesiology

## 2018-02-15 ENCOUNTER — Ambulatory Visit: Payer: Medicare Other | Admitting: Family

## 2018-02-15 ENCOUNTER — Other Ambulatory Visit: Payer: Self-pay

## 2018-02-15 DIAGNOSIS — I10 Essential (primary) hypertension: Secondary | ICD-10-CM | POA: Insufficient documentation

## 2018-02-15 DIAGNOSIS — E119 Type 2 diabetes mellitus without complications: Secondary | ICD-10-CM | POA: Insufficient documentation

## 2018-02-15 DIAGNOSIS — H2511 Age-related nuclear cataract, right eye: Secondary | ICD-10-CM | POA: Insufficient documentation

## 2018-02-15 DIAGNOSIS — Z8673 Personal history of transient ischemic attack (TIA), and cerebral infarction without residual deficits: Secondary | ICD-10-CM | POA: Insufficient documentation

## 2018-02-15 HISTORY — PX: CATARACT EXTRACTION W/PHACO: SHX586

## 2018-02-15 HISTORY — DX: Anemia, unspecified: D64.9

## 2018-02-15 HISTORY — DX: Unspecified hearing loss, unspecified ear: H91.90

## 2018-02-15 HISTORY — DX: Acute embolism and thrombosis of unspecified deep veins of unspecified lower extremity: I82.409

## 2018-02-15 HISTORY — DX: Cerebral infarction, unspecified: I63.9

## 2018-02-15 LAB — GLUCOSE, CAPILLARY: Glucose-Capillary: 221 mg/dL — ABNORMAL HIGH (ref 65–99)

## 2018-02-15 SURGERY — PHACOEMULSIFICATION, CATARACT, WITH IOL INSERTION
Anesthesia: Monitor Anesthesia Care | Site: Eye | Laterality: Right | Wound class: Clean

## 2018-02-15 MED ORDER — POVIDONE-IODINE 5 % OP SOLN
OPHTHALMIC | Status: AC
  Filled 2018-02-15: qty 30

## 2018-02-15 MED ORDER — MOXIFLOXACIN HCL 0.5 % OP SOLN
1.0000 [drp] | OPHTHALMIC | Status: AC
  Administered 2018-02-15 (×3): 1 [drp] via OPHTHALMIC

## 2018-02-15 MED ORDER — MIDAZOLAM HCL 2 MG/2ML IJ SOLN
INTRAMUSCULAR | Status: AC
  Filled 2018-02-15: qty 2

## 2018-02-15 MED ORDER — FENTANYL CITRATE (PF) 100 MCG/2ML IJ SOLN
INTRAMUSCULAR | Status: DC | PRN
  Administered 2018-02-15 (×2): 25 ug via INTRAVENOUS

## 2018-02-15 MED ORDER — EPINEPHRINE PF 1 MG/ML IJ SOLN
INTRAMUSCULAR | Status: AC
  Filled 2018-02-15: qty 1

## 2018-02-15 MED ORDER — MOXIFLOXACIN HCL 0.5 % OP SOLN
OPHTHALMIC | Status: AC
  Filled 2018-02-15: qty 3

## 2018-02-15 MED ORDER — SODIUM CHLORIDE 0.9 % IV SOLN
INTRAVENOUS | Status: DC
  Administered 2018-02-15: 06:00:00 via INTRAVENOUS

## 2018-02-15 MED ORDER — FENTANYL CITRATE (PF) 100 MCG/2ML IJ SOLN
INTRAMUSCULAR | Status: AC
  Filled 2018-02-15: qty 2

## 2018-02-15 MED ORDER — NEOMYCIN-POLYMYXIN-DEXAMETH 0.1 % OP OINT
TOPICAL_OINTMENT | OPHTHALMIC | Status: DC | PRN
  Administered 2018-02-15: 1 via OPHTHALMIC

## 2018-02-15 MED ORDER — NA HYALUR & NA CHOND-NA HYALUR 0.55-0.5 ML IO KIT
PACK | INTRAOCULAR | Status: AC
  Filled 2018-02-15: qty 1.05

## 2018-02-15 MED ORDER — MOXIFLOXACIN HCL 0.5 % OP SOLN
OPHTHALMIC | Status: AC
  Administered 2018-02-15: 1 [drp] via OPHTHALMIC
  Filled 2018-02-15: qty 3

## 2018-02-15 MED ORDER — ARMC OPHTHALMIC DILATING DROPS
OPHTHALMIC | Status: AC
  Administered 2018-02-15: 1 via OPHTHALMIC
  Filled 2018-02-15: qty 0.4

## 2018-02-15 MED ORDER — EPINEPHRINE PF 1 MG/ML IJ SOLN
INTRAOCULAR | Status: DC | PRN
  Administered 2018-02-15: 1 mL via OPHTHALMIC

## 2018-02-15 MED ORDER — LIDOCAINE HCL (PF) 4 % IJ SOLN
INTRAOCULAR | Status: DC | PRN
  Administered 2018-02-15: 2 mL via OPHTHALMIC

## 2018-02-15 MED ORDER — POVIDONE-IODINE 5 % OP SOLN
OPHTHALMIC | Status: DC | PRN
  Administered 2018-02-15: 1 via OPHTHALMIC

## 2018-02-15 MED ORDER — ARMC OPHTHALMIC DILATING DROPS
1.0000 "application " | OPHTHALMIC | Status: AC
  Administered 2018-02-15 (×3): 1 via OPHTHALMIC

## 2018-02-15 MED ORDER — NA HYALUR & NA CHOND-NA HYALUR 0.4-0.35 ML IO KIT
PACK | INTRAOCULAR | Status: DC | PRN
  Administered 2018-02-15: 1 mL via INTRAOCULAR

## 2018-02-15 MED ORDER — CARBACHOL 0.01 % IO SOLN
INTRAOCULAR | Status: DC | PRN
  Administered 2018-02-15: .5 mL via INTRAOCULAR

## 2018-02-15 MED ORDER — LIDOCAINE HCL (PF) 4 % IJ SOLN
INTRAMUSCULAR | Status: AC
  Filled 2018-02-15: qty 5

## 2018-02-15 MED ORDER — MIDAZOLAM HCL 2 MG/2ML IJ SOLN
INTRAMUSCULAR | Status: DC | PRN
  Administered 2018-02-15 (×2): 1 mg via INTRAVENOUS

## 2018-02-15 MED ORDER — NEOMYCIN-POLYMYXIN-DEXAMETH 3.5-10000-0.1 OP OINT
TOPICAL_OINTMENT | OPHTHALMIC | Status: AC
  Filled 2018-02-15: qty 3.5

## 2018-02-15 SURGICAL SUPPLY — 18 items
GLOVE BIO SURGEON STRL SZ8 (GLOVE) ×3 IMPLANT
GLOVE BIOGEL M 6.5 STRL (GLOVE) ×3 IMPLANT
GLOVE SURG LX 7.5 STRW (GLOVE) ×2
GLOVE SURG LX STRL 7.5 STRW (GLOVE) ×1 IMPLANT
GOWN STRL REUS W/ TWL LRG LVL3 (GOWN DISPOSABLE) ×2 IMPLANT
GOWN STRL REUS W/TWL LRG LVL3 (GOWN DISPOSABLE) ×4
LABEL CATARACT MEDS ST (LABEL) ×3 IMPLANT
LENS IOL TECNIS ITEC 24.0 (Intraocular Lens) ×3 IMPLANT
NDL HPO THNWL 1X22GA REG BVL (NEEDLE) ×1 IMPLANT
NEEDLE SAFETY 22GX1 (NEEDLE) ×2
PACK CATARACT (MISCELLANEOUS) ×3 IMPLANT
PACK CATARACT BRASINGTON LX (MISCELLANEOUS) ×3 IMPLANT
PACK EYE AFTER SURG (MISCELLANEOUS) ×3 IMPLANT
SOL BSS BAG (MISCELLANEOUS) ×3
SOLUTION BSS BAG (MISCELLANEOUS) ×1 IMPLANT
SYR 5ML LL (SYRINGE) ×3 IMPLANT
WATER STERILE IRR 250ML POUR (IV SOLUTION) ×3 IMPLANT
WIPE NON LINTING 3.25X3.25 (MISCELLANEOUS) ×6 IMPLANT

## 2018-02-15 NOTE — H&P (Signed)
The History and Physical notes are on paper, have been signed, and are to be scanned. The patient remains stable and unchanged from the H&P.   Previous H&P reviewed, patient examined, and there are no changes.  Gina Tate 02/15/2018 7:25 AM   

## 2018-02-15 NOTE — Transfer of Care (Signed)
Immediate Anesthesia Transfer of Care Note  Patient: Gina Tate  Procedure(s) Performed: CATARACT EXTRACTION PHACO AND INTRAOCULAR LENS PLACEMENT (IOC) (Right Eye)  Patient Location: PACU  Anesthesia Type:MAC  Level of Consciousness: awake  Airway & Oxygen Therapy: Patient Spontanous Breathing  Post-op Assessment: Report given to RN  Post vital signs: stable  Last Vitals:  Vitals Value Taken Time  BP 162/71 02/15/2018  8:03 AM  Temp 36.2 C 02/15/2018  8:03 AM  Pulse 80 02/15/2018  8:03 AM  Resp 16 02/15/2018  8:03 AM  SpO2 96 % 02/15/2018  8:03 AM    Last Pain:  Vitals:   02/15/18 0803  TempSrc: Temporal  PainSc: 0-No pain         Complications: No apparent anesthesia complications

## 2018-02-15 NOTE — Anesthesia Preprocedure Evaluation (Signed)
Anesthesia Evaluation  Patient identified by MRN, date of birth, ID band Patient awake    Reviewed: Allergy & Precautions, H&P , NPO status , Patient's Chart, lab work & pertinent test results, reviewed documented beta blocker date and time   History of Anesthesia Complications Negative for: history of anesthetic complications  Airway Mallampati: III  TM Distance: >3 FB Neck ROM: full    Dental no notable dental hx. (+) Missing, Poor Dentition   Pulmonary neg pulmonary ROS, shortness of breath and with exertion,    Pulmonary exam normal breath sounds clear to auscultation       Cardiovascular Exercise Tolerance: Good hypertension, On Medications and On Home Beta Blockers (-) angina(-) CAD, (-) Past MI, (-) Cardiac Stents and (-) CABG Normal cardiovascular exam(-) dysrhythmias (-) Valvular Problems/Murmurs Rhythm:regular Rate:Normal     Neuro/Psych  Headaches, PSYCHIATRIC DISORDERS (Depression and dementia) Depression Dementia TIACVA    GI/Hepatic Neg liver ROS, hiatal hernia, GERD  Medicated,  Endo/Other  diabetes, Poorly Controlled, Insulin Dependent, Oral Hypoglycemic AgentsHypothyroidism Morbid obesity  Renal/GU negative Renal ROS  negative genitourinary   Musculoskeletal  (+) Arthritis , Osteoarthritis,    Abdominal   Peds negative pediatric ROS (+)  Hematology  (+) Blood dyscrasia, anemia ,   Anesthesia Other Findings Past Medical History:   Depression                                                   Thyroid disease                                              Diabetes mellitus, type II (HCC)                             Headache                                                     Hypercholesteremia                                           Hypertension                                                 Hypothyroidism                                               Dementia  GERD (gastroesophageal reflux disease)                       History of hiatal hernia                                     Reproductive/Obstetrics negative OB ROS                             Anesthesia Physical  Anesthesia Plan  ASA: III  Anesthesia Plan: MAC   Post-op Pain Management:    Induction:   PONV Risk Score and Plan:   Airway Management Planned: Nasal Cannula  Additional Equipment:   Intra-op Plan:   Post-operative Plan:   Informed Consent: I have reviewed the patients History and Physical, chart, labs and discussed the procedure including the risks, benefits and alternatives for the proposed anesthesia with the patient or authorized representative who has indicated his/her understanding and acceptance.   Dental Advisory Given  Plan Discussed with: Anesthesiologist, CRNA and Surgeon  Anesthesia Plan Comments:         Anesthesia Quick Evaluation

## 2018-02-15 NOTE — Discharge Instructions (Signed)

## 2018-02-15 NOTE — Op Note (Signed)
LOCATION:  [facility]   PREOPERATIVE DIAGNOSIS:    Nuclear sclerotic cataract right eye. H25.11   POSTOPERATIVE DIAGNOSIS:  Nuclear sclerotic cataract right eye.     PROCEDURE:  Phacoemusification with posterior chamber intraocular lens placement of the right eye   LENS:   Implant Name Type Inv. Item Serial No. Manufacturer Lot No. LRB No. Used  LENS IOL DIOP 24.0 - [id number] Intraocular Lens LENS IOL DIOP 24.0 [id number]  Right 1        ULTRASOUND TIME: 16 % of 0 minutes, 55 seconds.  CDE 9.0   SURGEON:  Deirdre Evenerhadwick R. Gustavia Carie, MD   ANESTHESIA:  Topical with tetracaine drops and 2% Xylocaine jelly, augmented with 1% preservative-free intracameral lidocaine.    COMPLICATIONS:  None.   DESCRIPTION OF PROCEDURE:  The patient was identified in the holding room and transported to the operating room and placed in the supine position under the operating microscope.  The right eye was identified as the operative eye and it was prepped and draped in the usual sterile ophthalmic fashion.   A 1 millimeter clear-corneal paracentesis was made at the 12:00 position.  0.5 ml of preservative-free 1% lidocaine was injected into the anterior chamber. The anterior chamber was filled with Viscoat viscoelastic.  A 2.4 millimeter keratome was used to make a near-clear corneal incision at the 9:00 position.  A curvilinear capsulorrhexis was made with a cystotome and capsulorrhexis forceps.  Balanced salt solution was used to hydrodissect and hydrodelineate the nucleus.   Phacoemulsification was then used in stop and chop fashion to remove the lens nucleus and epinucleus.  The remaining cortex was then removed using the irrigation and aspiration handpiece. Provisc was then placed into the capsular bag to distend it for lens placement.  A lens was then injected into the capsular bag.  The remaining viscoelastic was aspirated.   Wounds were hydrated with balanced salt solution.  The anterior  chamber was inflated to a physiologic pressure with balanced salt solution.  No wound leaks were noted. Vigamox 0.2 ml of a 1mg  per ml solution was injected into the anterior chamber for a dose of 0.2 mg of intracameral antibiotic at the completion of the case.   Timolol and Brimonidine drops were applied to the eye.  The patient was taken to the recovery room in stable condition without complications of anesthesia or surgery.   Casson Catena 02/15/2018, 7:58 AM

## 2018-02-15 NOTE — Anesthesia Postprocedure Evaluation (Signed)
Anesthesia Post Note  Patient: Gina Tate  Procedure(s) Performed: CATARACT EXTRACTION PHACO AND INTRAOCULAR LENS PLACEMENT ([name]) (Right Eye)  Patient location during evaluation: Other Anesthesia Type: MAC Level of consciousness: awake Pain management: pain level controlled Vital Signs Assessment: post-procedure vital signs reviewed and stable Respiratory status: spontaneous breathing Cardiovascular status: blood pressure returned to baseline Anesthetic complications: no     Last Vitals:  Vitals:   02/15/18 0612 02/15/18 0803  BP: (!) 153/76 (!) 162/71  Pulse: 72 80  Resp: 17 16  Temp: 36.9 C (!) 36.2 C  SpO2: 98% 96%    Last Pain:  Vitals:   02/15/18 0803  TempSrc: Temporal  PainSc: 0-No pain                 Reshunda Strider Marilynn Rail

## 2018-02-15 NOTE — Anesthesia Post-op Follow-up Note (Signed)
Anesthesia QCDR form completed.        

## 2018-03-01 ENCOUNTER — Encounter: Payer: Self-pay | Admitting: *Deleted

## 2018-03-10 ENCOUNTER — Ambulatory Visit: Payer: Medicare Other | Admitting: Anesthesiology

## 2018-03-10 ENCOUNTER — Encounter
Admission: RE | Disposition: A | Discharge status: Home / Self Care | Payer: Self-pay | Source: Ambulatory Visit | Attending: Ophthalmology

## 2018-03-10 ENCOUNTER — Ambulatory Visit
Admission: RE | Admit: 2018-03-10 | Discharge: 2018-03-10 | Disposition: A | Discharge status: Home / Self Care | Payer: Medicare Other | Source: Ambulatory Visit | Attending: Ophthalmology | Admitting: Ophthalmology

## 2018-03-10 ENCOUNTER — Encounter: Payer: Self-pay | Admitting: *Deleted

## 2018-03-10 ENCOUNTER — Other Ambulatory Visit: Payer: Self-pay

## 2018-03-10 DIAGNOSIS — E119 Type 2 diabetes mellitus without complications: Secondary | ICD-10-CM | POA: Diagnosis not present

## 2018-03-10 DIAGNOSIS — E039 Hypothyroidism, unspecified: Secondary | ICD-10-CM | POA: Diagnosis not present

## 2018-03-10 DIAGNOSIS — I10 Essential (primary) hypertension: Secondary | ICD-10-CM | POA: Insufficient documentation

## 2018-03-10 DIAGNOSIS — Z79899 Other long term (current) drug therapy: Secondary | ICD-10-CM | POA: Insufficient documentation

## 2018-03-10 DIAGNOSIS — Z794 Long term (current) use of insulin: Secondary | ICD-10-CM | POA: Diagnosis not present

## 2018-03-10 DIAGNOSIS — E7800 Pure hypercholesterolemia, unspecified: Secondary | ICD-10-CM | POA: Insufficient documentation

## 2018-03-10 DIAGNOSIS — F329 Major depressive disorder, single episode, unspecified: Secondary | ICD-10-CM | POA: Diagnosis not present

## 2018-03-10 DIAGNOSIS — H2512 Age-related nuclear cataract, left eye: Secondary | ICD-10-CM | POA: Diagnosis present

## 2018-03-10 HISTORY — DX: Chronic cough: R05.3

## 2018-03-10 HISTORY — DX: Cough: R05

## 2018-03-10 HISTORY — DX: Pneumonia, unspecified organism: J18.9

## 2018-03-10 HISTORY — PX: CATARACT EXTRACTION W/PHACO: SHX586

## 2018-03-10 HISTORY — DX: Bronchitis, not specified as acute or chronic: J40

## 2018-03-10 HISTORY — DX: Wheezing: R06.2

## 2018-03-10 HISTORY — DX: Diaphragmatic hernia without obstruction or gangrene: K44.9

## 2018-03-10 HISTORY — DX: Other allergic rhinitis: J30.89

## 2018-03-10 LAB — GLUCOSE, CAPILLARY: Glucose-Capillary: 185 mg/dL — ABNORMAL HIGH (ref 70–99)

## 2018-03-10 SURGERY — PHACOEMULSIFICATION, CATARACT, WITH IOL INSERTION
Anesthesia: Monitor Anesthesia Care | Site: Eye | Laterality: Left | Wound class: Clean

## 2018-03-10 MED ORDER — BSS IO SOLN
INTRAOCULAR | Status: DC | PRN
  Administered 2018-03-10: 250 mL via OPHTHALMIC

## 2018-03-10 MED ORDER — POVIDONE-IODINE 5 % OP SOLN
OPHTHALMIC | Status: DC | PRN
  Administered 2018-03-10: 1 via OPHTHALMIC

## 2018-03-10 MED ORDER — NA HYALUR & NA CHOND-NA HYALUR 0.55-0.5 ML IO KIT
PACK | INTRAOCULAR | Status: AC
  Filled 2018-03-10: qty 1.05

## 2018-03-10 MED ORDER — CYCLOPENTOLATE HCL 2 % OP SOLN
1.0000 [drp] | OPHTHALMIC | Status: AC
  Administered 2018-03-10 (×4): 1 [drp] via OPHTHALMIC

## 2018-03-10 MED ORDER — NEOMYCIN-POLYMYXIN-DEXAMETH 0.1 % OP OINT
TOPICAL_OINTMENT | OPHTHALMIC | Status: DC | PRN
  Administered 2018-03-10: 1 via OPHTHALMIC

## 2018-03-10 MED ORDER — EPINEPHRINE PF 1 MG/ML IJ SOLN
INTRAMUSCULAR | Status: AC
  Filled 2018-03-10: qty 1

## 2018-03-10 MED ORDER — SODIUM CHLORIDE 0.9 % IV SOLN
INTRAVENOUS | Status: DC
  Administered 2018-03-10: 09:00:00 via INTRAVENOUS

## 2018-03-10 MED ORDER — PHENYLEPHRINE HCL 10 % OP SOLN
OPHTHALMIC | Status: AC
  Administered 2018-03-10: 1 [drp] via OPHTHALMIC
  Filled 2018-03-10: qty 5

## 2018-03-10 MED ORDER — FENTANYL CITRATE (PF) 100 MCG/2ML IJ SOLN
INTRAMUSCULAR | Status: DC | PRN
  Administered 2018-03-10: 50 ug via INTRAVENOUS
  Administered 2018-03-10 (×2): 25 ug via INTRAVENOUS

## 2018-03-10 MED ORDER — NA HYALUR & NA CHOND-NA HYALUR 0.4-0.35 ML IO KIT
PACK | INTRAOCULAR | Status: DC | PRN
  Administered 2018-03-10: .35 mL via INTRAOCULAR

## 2018-03-10 MED ORDER — FENTANYL CITRATE (PF) 100 MCG/2ML IJ SOLN
INTRAMUSCULAR | Status: AC
  Filled 2018-03-10: qty 2

## 2018-03-10 MED ORDER — TETRACAINE HCL 0.5 % OP SOLN
OPHTHALMIC | Status: AC
  Filled 2018-03-10: qty 4

## 2018-03-10 MED ORDER — CYCLOPENTOLATE HCL 2 % OP SOLN
OPHTHALMIC | Status: AC
  Administered 2018-03-10: 1 [drp] via OPHTHALMIC
  Filled 2018-03-10: qty 2

## 2018-03-10 MED ORDER — PHENYLEPHRINE HCL 10 % OP SOLN
1.0000 [drp] | OPHTHALMIC | Status: AC
  Administered 2018-03-10 (×4): 1 [drp] via OPHTHALMIC

## 2018-03-10 MED ORDER — CARBACHOL 0.01 % IO SOLN
INTRAOCULAR | Status: DC | PRN
  Administered 2018-03-10: 0.5 mL via INTRAOCULAR

## 2018-03-10 MED ORDER — TETRACAINE HCL 0.5 % OP SOLN
1.0000 [drp] | OPHTHALMIC | Status: AC | PRN
  Administered 2018-03-10 (×2): 1 [drp] via OPHTHALMIC

## 2018-03-10 MED ORDER — MOXIFLOXACIN HCL 0.5 % OP SOLN
1.0000 [drp] | OPHTHALMIC | Status: AC
  Administered 2018-03-10 (×3): 1 [drp] via OPHTHALMIC

## 2018-03-10 MED ORDER — LIDOCAINE HCL (PF) 4 % IJ SOLN
INTRAMUSCULAR | Status: AC
Start: 2018-03-10 — End: ?
  Filled 2018-03-10: qty 5

## 2018-03-10 MED ORDER — MOXIFLOXACIN HCL 0.5 % OP SOLN
OPHTHALMIC | Status: AC
  Administered 2018-03-10: 1 [drp] via OPHTHALMIC
  Filled 2018-03-10: qty 3

## 2018-03-10 MED ORDER — MIDAZOLAM HCL 2 MG/2ML IJ SOLN
INTRAMUSCULAR | Status: DC | PRN
  Administered 2018-03-10 (×2): 1 mg via INTRAVENOUS

## 2018-03-10 MED ORDER — LIDOCAINE HCL (PF) 4 % IJ SOLN
INTRAOCULAR | Status: DC | PRN
  Administered 2018-03-10: 1 mL via OPHTHALMIC

## 2018-03-10 MED ORDER — POVIDONE-IODINE 5 % OP SOLN
OPHTHALMIC | Status: AC
  Filled 2018-03-10: qty 30

## 2018-03-10 MED ORDER — MIDAZOLAM HCL 2 MG/2ML IJ SOLN
INTRAMUSCULAR | Status: AC
  Filled 2018-03-10: qty 2

## 2018-03-10 SURGICAL SUPPLY — 18 items
GLOVE BIO SURGEON STRL SZ8 (GLOVE) ×3 IMPLANT
GLOVE BIOGEL M 6.5 STRL (GLOVE) ×3 IMPLANT
GLOVE SURG LX 7.5 STRW (GLOVE) ×2
GLOVE SURG LX STRL 7.5 STRW (GLOVE) ×1 IMPLANT
GOWN STRL REUS W/ TWL LRG LVL3 (GOWN DISPOSABLE) ×2 IMPLANT
GOWN STRL REUS W/TWL LRG LVL3 (GOWN DISPOSABLE) ×4
LABEL CATARACT MEDS ST (LABEL) ×3 IMPLANT
LENS IOL TECNIS ITEC 23.5 (Intraocular Lens) ×3 IMPLANT
NDL HPO THNWL 1X22GA REG BVL (NEEDLE) ×1 IMPLANT
NEEDLE SAFETY 22GX1 (NEEDLE) ×2
PACK CATARACT (MISCELLANEOUS) ×3 IMPLANT
PACK CATARACT BRASINGTON LX (MISCELLANEOUS) ×3 IMPLANT
PACK EYE AFTER SURG (MISCELLANEOUS) ×3 IMPLANT
SOL BSS BAG (MISCELLANEOUS) ×3
SOLUTION BSS BAG (MISCELLANEOUS) ×1 IMPLANT
SYR 5ML LL (SYRINGE) ×3 IMPLANT
WATER STERILE IRR 250ML POUR (IV SOLUTION) ×3 IMPLANT
WIPE NON LINTING 3.25X3.25 (MISCELLANEOUS) ×3 IMPLANT

## 2018-03-10 NOTE — Anesthesia Post-op Follow-up Note (Signed)
Anesthesia QCDR form completed.        

## 2018-03-10 NOTE — Anesthesia Preprocedure Evaluation (Addendum)
Anesthesia Evaluation  Patient identified by MRN, date of birth, ID band Patient awake    Reviewed: Allergy & Precautions, H&P , NPO status , Patient's Chart, lab work & pertinent test results, reviewed documented beta blocker date and time   History of Anesthesia Complications Negative for: history of anesthetic complications  Airway Mallampati: III  TM Distance: >3 FB Neck ROM: full    Dental  (+) Missing, Poor Dentition,    Pulmonary neg pulmonary ROS, shortness of breath and with exertion,    Pulmonary exam normal breath sounds clear to auscultation       Cardiovascular Exercise Tolerance: Good hypertension, On Medications and On Home Beta Blockers (-) angina(-) CAD, (-) Past MI, (-) Cardiac Stents and (-) CABG Normal cardiovascular exam(-) dysrhythmias (-) Valvular Problems/Murmurs Rhythm:regular Rate:Normal     Neuro/Psych  Headaches, PSYCHIATRIC DISORDERS (Depression and dementia) Depression Dementia TIACVA    GI/Hepatic Neg liver ROS, hiatal hernia, GERD  Medicated,  Endo/Other  diabetes, Poorly Controlled, Insulin Dependent, Oral Hypoglycemic AgentsHypothyroidism Morbid obesity  Renal/GU negative Renal ROS  negative genitourinary   Musculoskeletal  (+) Arthritis , Osteoarthritis,    Abdominal   Peds negative pediatric ROS (+)  Hematology  (+) Blood dyscrasia, anemia ,   Anesthesia Other Findings Past Medical History:   Depression                                                   Thyroid disease                                              Diabetes mellitus, type II ([place])                             Headache                                                     Hypercholesteremia                                           Hypertension                                                 Hypothyroidism                                               Dementia                                                     GERD  (  gastroesophageal reflux disease)                       History of hiatal hernia                                     Reproductive/Obstetrics negative OB ROS                            Anesthesia Physical  Anesthesia Plan  ASA: III  Anesthesia Plan: MAC   Post-op Pain Management:    Induction:   PONV Risk Score and Plan:   Airway Management Planned: Nasal Cannula  Additional Equipment:   Intra-op Plan:   Post-operative Plan:   Informed Consent: I have reviewed the patients History and Physical, chart, labs and discussed the procedure including the risks, benefits and alternatives for the proposed anesthesia with the patient or authorized representative who has indicated his/her understanding and acceptance.   Dental Advisory Given  Plan Discussed with: Anesthesiologist, Surgeon and CRNA  Anesthesia Plan Comments:         Anesthesia Quick Evaluation

## 2018-03-10 NOTE — Transfer of Care (Signed)
Immediate Anesthesia Transfer of Care Note  Patient: Gina Tate  Procedure(s) Performed: CATARACT EXTRACTION PHACO AND INTRAOCULAR LENS PLACEMENT ([facility]) (Left Eye)  Patient Location: PACU  Anesthesia Type:MAC  Level of Consciousness: awake, alert  and oriented  Airway & Oxygen Therapy: Patient Spontanous Breathing  Post-op Assessment: Report given to RN and Post -op Vital signs reviewed and stable  Post vital signs: Reviewed and stable  Last Vitals:  Vitals Value Taken Time  BP    Temp    Pulse    Resp    SpO2      Last Pain:  Vitals:   03/10/18 0837  TempSrc: Tympanic  PainSc: 0-No pain         Complications: No apparent anesthesia complications

## 2018-03-10 NOTE — Discharge Instructions (Signed)
Eye Surgery Discharge Instructions    Expect mild scratchy sensation or mild soreness. DO NOT RUB YOUR EYE!  The day of surgery:  Minimal physical activity, but bed rest is not required  No reading, computer work, or close hand work  No bending, lifting, or straining.  May watch TV  For 24 hours:  No driving, legal decisions, or alcoholic beverages  Safety precautions  Eat anything you prefer: It is better to start with liquids, then soup then solid foods.  _____ Eye patch should be worn until postoperative exam tomorrow.  ____ Solar shield eyeglasses should be worn for comfort in the sunlight/patch while sleeping  Resume all regular medications including aspirin or Coumadin if these were discontinued prior to surgery. You may shower, bathe, shave, or wash your hair. Tylenol may be taken for mild discomfort.  Call your doctor if you experience significant pain, nausea, or vomiting, fever > 101 or other signs of infection. [phone] or [phone] Specific instructions:  Follow-up Information    Lockie MolaBrasington, Chadwick, MD Follow up.   Specialty:  Ophthalmology Why:  03/10/18 at 3:30 Contact information: [address]   [place] [address] [phone]          Eye Surgery Discharge Instructions    Expect mild scratchy sensation or mild soreness. DO NOT RUB YOUR EYE!  The day of surgery:  Minimal physical activity, but bed rest is not required  No reading, computer work, or close hand work  No bending, lifting, or straining.  May watch TV  For 24 hours:  No driving, legal decisions, or alcoholic beverages  Safety precautions  Eat anything you prefer: It is better to start with liquids, then soup then solid foods.  _____ Eye patch should be worn until postoperative exam tomorrow.  ____ Solar shield eyeglasses should be worn for comfort in the sunlight/patch while sleeping  Resume all regular medications including aspirin or Coumadin  if these were discontinued prior to surgery. You may shower, bathe, shave, or wash your hair. Tylenol may be taken for mild discomfort.  Call your doctor if you experience significant pain, nausea, or vomiting, fever > 101 or other signs of infection. [phone] or [phone] Specific instructions:  Follow-up Information    Lockie MolaBrasington, Chadwick, MD Follow up.   Specialty:  Ophthalmology Why:  03/10/18 at 3:30 Contact information: [address]   [place] [address] [phone]

## 2018-03-10 NOTE — Op Note (Signed)
OPERATIVE NOTE  Eloise LevelsSylvia M Sterba [phone] 03/10/2018   PREOPERATIVE DIAGNOSIS:  Nuclear sclerotic cataract left eye. H25.12   POSTOPERATIVE DIAGNOSIS:    Nuclear sclerotic cataract left eye.     PROCEDURE:  Phacoemusification with posterior chamber intraocular lens placement of the left eye   LENS:   Implant Name Type Inv. Item Serial No. Manufacturer Lot No. LRB No. Used  LENS IOL DIOP 23.5 - [id number] Intraocular Lens LENS IOL DIOP 23.5 [id number] AMO  Left 1        ULTRASOUND TIME: 13 % of 0 minutes, 56 seconds.  CDE 7.0   SURGEON:  Deirdre Evenerhadwick R. Gelisa Tieken, MD   ANESTHESIA:  Topical with tetracaine drops and 2% Xylocaine jelly, augmented with 1% preservative-free intracameral lidocaine.    COMPLICATIONS:  None.   DESCRIPTION OF PROCEDURE:  The patient was identified in the holding room and transported to the operating room and placed in the supine position under the operating microscope.  The left eye was identified as the operative eye and it was prepped and draped in the usual sterile ophthalmic fashion.   A 1 millimeter clear-corneal paracentesis was made at the 1:30 position. 0.5 ml of preservative-free 1% lidocaine was injected into the anterior chamber.  The anterior chamber was filled with Viscoat viscoelastic.  A 2.4 millimeter keratome was used to make a near-clear corneal incision at the 10:30 position.  .  A curvilinear capsulorrhexis was made with a cystotome and capsulorrhexis forceps.  Balanced salt solution was used to hydrodissect and hydrodelineate the nucleus.   Phacoemulsification was then used in stop and chop fashion to remove the lens nucleus and epinucleus.  The remaining cortex was then removed using the irrigation and aspiration handpiece. Provisc was then placed into the capsular bag to distend it for lens placement.  A lens was then injected into the capsular bag.  The remaining viscoelastic was aspirated.   Wounds were hydrated with balanced salt  solution.  The anterior chamber was inflated to a physiologic pressure with balanced salt solution. Vigamox 0.2 ml of a 1mg  per ml solution was injected into the anterior chamber for a dose of 0.2 mg of intracameral antibiotic at the completion of the case.  Miostat was placed into the anterior chamber to constrict the pupil.  No wound leaks were noted.  Topical Maxitrol ointment was applied to the eye.  The patient was taken to the recovery room in stable condition without complications of anesthesia or surgery  Lajoyce Tamura 03/10/2018, 10:44 AM

## 2018-03-10 NOTE — Anesthesia Postprocedure Evaluation (Signed)
Anesthesia Post Note  Patient: Gina Tate  Procedure(s) Performed: CATARACT EXTRACTION PHACO AND INTRAOCULAR LENS PLACEMENT ([place]) (Left Eye)  Patient location during evaluation: PACU Anesthesia Type: MAC Level of consciousness: awake and alert Pain management: pain level controlled Vital Signs Assessment: post-procedure vital signs reviewed and stable Respiratory status: spontaneous breathing, nonlabored ventilation and respiratory function stable Cardiovascular status: stable and blood pressure returned to baseline Postop Assessment: no apparent nausea or vomiting Anesthetic complications: no     Last Vitals:  Vitals:   03/10/18 0837 03/10/18 1047  BP: (!) 124/51 (!) 131/56  Pulse: 74 86  Resp: 14 16  Temp: (!) 36.3 C (!) 36.3 C  SpO2: 98% 98%    Last Pain:  Vitals:   03/10/18 1047  TempSrc: Temporal  PainSc: 0-No pain                 Durenda Hurt

## 2018-03-10 NOTE — H&P (Signed)
The History and Physical notes are on paper, have been signed, and are to be scanned. The patient remains stable and unchanged from the H&P.   Previous H&P reviewed, patient examined, and there are no changes.  Gina Tate 03/10/2018 9:18 AM

## 2018-03-11 ENCOUNTER — Encounter: Payer: Self-pay | Admitting: Ophthalmology

## 2018-05-19 ENCOUNTER — Other Ambulatory Visit: Payer: Self-pay | Admitting: Orthopedic Surgery

## 2018-05-19 DIAGNOSIS — M5412 Radiculopathy, cervical region: Secondary | ICD-10-CM

## 2018-06-13 ENCOUNTER — Ambulatory Visit
Admission: RE | Admit: 2018-06-13 | Discharge: 2018-06-13 | Disposition: A | Discharge status: Home / Self Care | Payer: Medicare Other | Source: Ambulatory Visit | Attending: Orthopedic Surgery | Admitting: Orthopedic Surgery

## 2018-06-13 DIAGNOSIS — M5412 Radiculopathy, cervical region: Secondary | ICD-10-CM | POA: Diagnosis not present

## 2019-07-07 IMAGING — CT CT KNEE*L* W/O CM
1 of 3 series · 7 of 14 positions shown, 9 images · non-contrast
Comparison: None.

CLINICAL DATA: Primary osteoarthritis of the left knee.
Pre-surgical planning.

EXAM:
CT OF THE LEFT KNEE WITHOUT CONTRAST
TECHNIQUE: Multidetector CT imaging of the left knee was performed according to
the standard protocol. Multiplanar CT image reconstructions were
also generated. Axial images were obtained through the hip and ankle
for pre-surgical planning.

[Series 11: axial st · axial · 0.35mm/px · z∈[+284,+508]mm · 7 of 300 slices shown, 9 images]
[im 38/300  soft-tissue]
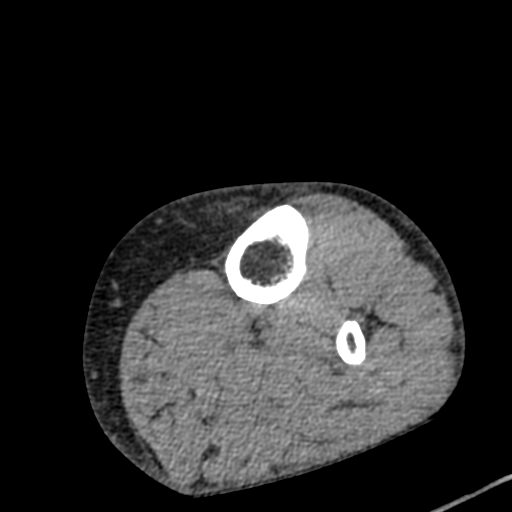
[im 38/300  bone]
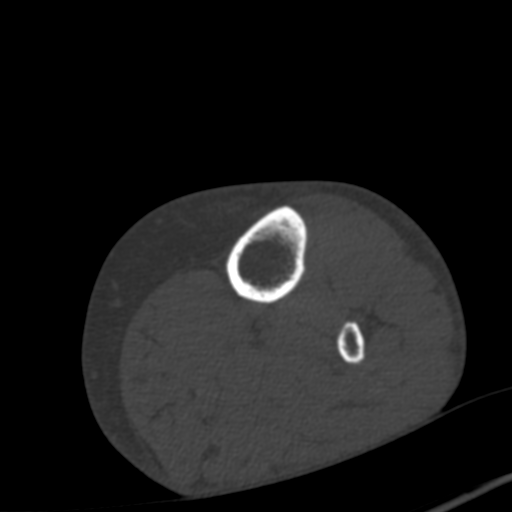
[im 75/300  bone]
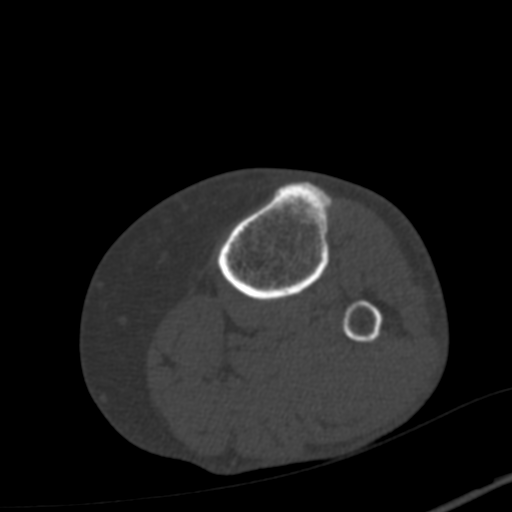
[im 113/300  bone]
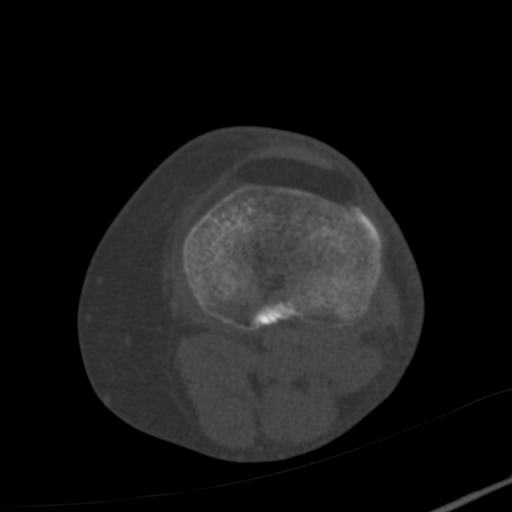
[im 150/300  bone]
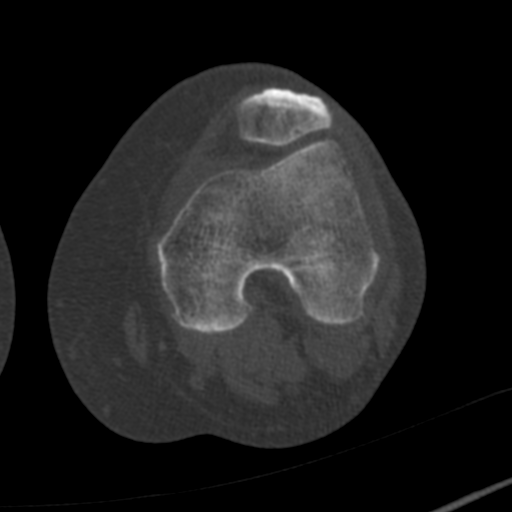
[im 187/300  soft-tissue]
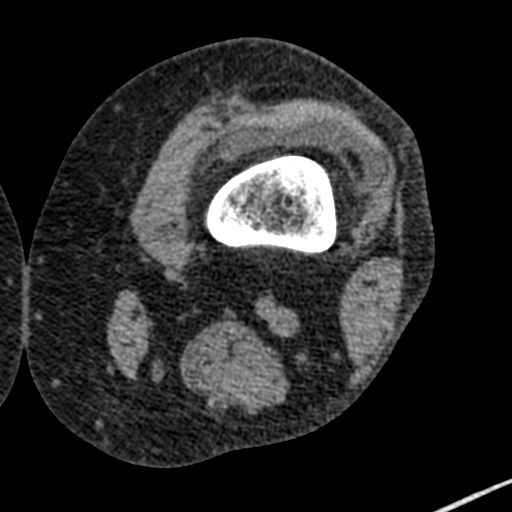
[im 187/300  bone]
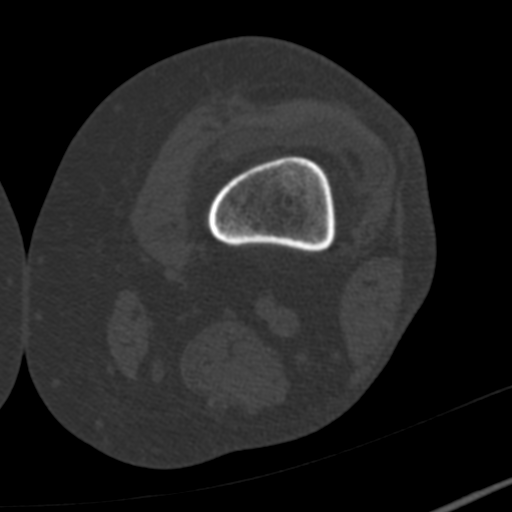
[im 225/300  bone]
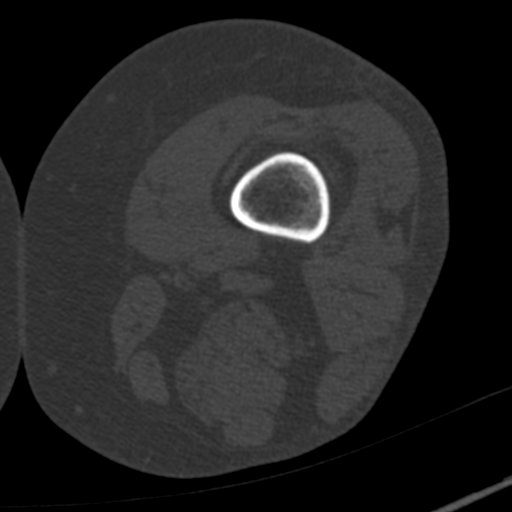
[im 262/300  bone]
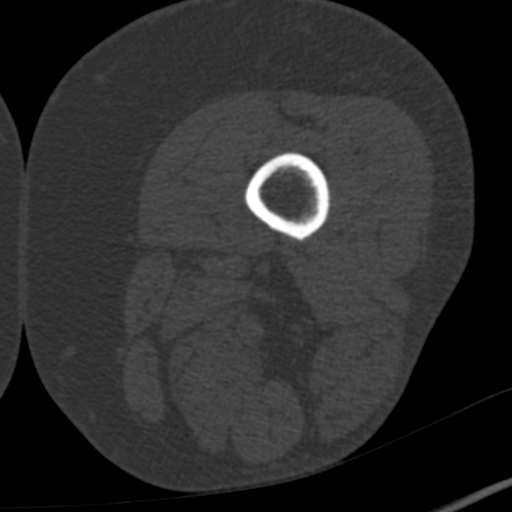

[7 of 14 positions shown; findings below may reference images not displayed]

FINDINGS: Bones/Joint/Cartilage

Left ankle appears normal. Minimal marginal osteophyte formation on
the superolateral aspect of the acetabulum of the left hip. Left hip
otherwise appears normal.

There is marked narrowing of the medial compartment of the left knee
with marginal osteophyte formation in the medial and lateral
compartments but more prominent medially. There is subcortical
sclerosis in the medial compartment with subcortical cyst formation
in the posterior aspect of the femoral condyles and of the tibial
plateau. Moderate to large joint effusion.
IMPRESSION: Osteoarthritis of the left knee most prominent in the medial
compartment as described. Moderate to large joint effusion.

Images available for pre-surgical planning.

## 2020-02-02 ENCOUNTER — Other Ambulatory Visit: Payer: Self-pay | Admitting: Nurse Practitioner

## 2020-02-02 DIAGNOSIS — M542 Cervicalgia: Secondary | ICD-10-CM

## 2020-02-17 ENCOUNTER — Other Ambulatory Visit: Payer: Self-pay

## 2020-02-17 ENCOUNTER — Ambulatory Visit
Admission: RE | Admit: 2020-02-17 | Discharge: 2020-02-17 | Disposition: A | Discharge status: Home / Self Care | Payer: Medicare Other | Source: Ambulatory Visit | Attending: Nurse Practitioner | Admitting: Nurse Practitioner

## 2020-02-17 DIAGNOSIS — M542 Cervicalgia: Secondary | ICD-10-CM | POA: Insufficient documentation

## 2020-11-22 ENCOUNTER — Other Ambulatory Visit: Payer: Self-pay | Admitting: Internal Medicine

## 2020-11-22 ENCOUNTER — Other Ambulatory Visit (HOSPITAL_COMMUNITY): Payer: Self-pay | Admitting: Internal Medicine

## 2020-11-22 DIAGNOSIS — U071 COVID-19: Secondary | ICD-10-CM

## 2020-11-26 ENCOUNTER — Ambulatory Visit: Admission: RE | Admit: 2020-11-26 | Payer: Medicare Other | Source: Home / Self Care | Admitting: Internal Medicine

## 2020-11-26 ENCOUNTER — Other Ambulatory Visit: Payer: Self-pay

## 2020-11-26 ENCOUNTER — Ambulatory Visit
Admission: RE | Admit: 2020-11-26 | Discharge: 2020-11-26 | Disposition: A | Discharge status: Home / Self Care | Payer: Medicare Other | Source: Ambulatory Visit | Attending: Internal Medicine | Admitting: Internal Medicine

## 2020-11-26 DIAGNOSIS — U071 COVID-19: Secondary | ICD-10-CM | POA: Diagnosis present

## 2020-11-26 MED ORDER — IOHEXOL 350 MG/ML SOLN
75.0000 mL | Freq: Once | INTRAVENOUS | Status: AC | PRN
  Administered 2020-11-26: 75 mL via INTRAVENOUS

## 2020-12-25 ENCOUNTER — Other Ambulatory Visit: Payer: Self-pay | Admitting: Internal Medicine

## 2020-12-25 DIAGNOSIS — R0602 Shortness of breath: Secondary | ICD-10-CM

## 2020-12-25 DIAGNOSIS — R918 Other nonspecific abnormal finding of lung field: Secondary | ICD-10-CM

## 2021-01-22 ENCOUNTER — Other Ambulatory Visit
Admission: RE | Admit: 2021-01-22 | Discharge: 2021-01-22 | Disposition: A | Discharge status: Home / Self Care | Payer: Medicare Other | Source: Ambulatory Visit | Attending: Pulmonary Disease | Admitting: Pulmonary Disease

## 2021-01-22 DIAGNOSIS — R0600 Dyspnea, unspecified: Secondary | ICD-10-CM | POA: Diagnosis present

## 2021-01-22 LAB — D-DIMER, QUANTITATIVE: D-Dimer, Quant: 0.36 ug/mL-FEU (ref 0.00–0.50)

## 2021-01-28 ENCOUNTER — Ambulatory Visit: Payer: Medicare Other | Admitting: Certified Registered"

## 2021-01-28 ENCOUNTER — Ambulatory Visit
Admission: RE | Admit: 2021-01-28 | Discharge: 2021-01-28 | Disposition: A | Discharge status: Home / Self Care | Payer: Medicare Other | Attending: Gastroenterology | Admitting: Gastroenterology

## 2021-01-28 ENCOUNTER — Encounter
Admission: RE | Disposition: A | Discharge status: Home / Self Care | Payer: Self-pay | Source: Home / Self Care | Attending: Gastroenterology

## 2021-01-28 DIAGNOSIS — Z8673 Personal history of transient ischemic attack (TIA), and cerebral infarction without residual deficits: Secondary | ICD-10-CM | POA: Diagnosis not present

## 2021-01-28 DIAGNOSIS — Z794 Long term (current) use of insulin: Secondary | ICD-10-CM | POA: Insufficient documentation

## 2021-01-28 DIAGNOSIS — Z7982 Long term (current) use of aspirin: Secondary | ICD-10-CM | POA: Insufficient documentation

## 2021-01-28 DIAGNOSIS — E039 Hypothyroidism, unspecified: Secondary | ICD-10-CM | POA: Insufficient documentation

## 2021-01-28 DIAGNOSIS — Z7984 Long term (current) use of oral hypoglycemic drugs: Secondary | ICD-10-CM | POA: Insufficient documentation

## 2021-01-28 DIAGNOSIS — K449 Diaphragmatic hernia without obstruction or gangrene: Secondary | ICD-10-CM | POA: Diagnosis not present

## 2021-01-28 DIAGNOSIS — I10 Essential (primary) hypertension: Secondary | ICD-10-CM | POA: Insufficient documentation

## 2021-01-28 DIAGNOSIS — K2100 Gastro-esophageal reflux disease with esophagitis, without bleeding: Secondary | ICD-10-CM | POA: Diagnosis not present

## 2021-01-28 DIAGNOSIS — Z79890 Hormone replacement therapy (postmenopausal): Secondary | ICD-10-CM | POA: Insufficient documentation

## 2021-01-28 DIAGNOSIS — Z86718 Personal history of other venous thrombosis and embolism: Secondary | ICD-10-CM | POA: Insufficient documentation

## 2021-01-28 DIAGNOSIS — Z79899 Other long term (current) drug therapy: Secondary | ICD-10-CM | POA: Diagnosis not present

## 2021-01-28 DIAGNOSIS — R1310 Dysphagia, unspecified: Secondary | ICD-10-CM | POA: Insufficient documentation

## 2021-01-28 DIAGNOSIS — E119 Type 2 diabetes mellitus without complications: Secondary | ICD-10-CM | POA: Diagnosis not present

## 2021-01-28 DIAGNOSIS — E785 Hyperlipidemia, unspecified: Secondary | ICD-10-CM | POA: Insufficient documentation

## 2021-01-28 HISTORY — PX: ESOPHAGOGASTRODUODENOSCOPY (EGD) WITH PROPOFOL: SHX5813

## 2021-01-28 LAB — GLUCOSE, CAPILLARY: Glucose-Capillary: 138 mg/dL — ABNORMAL HIGH (ref 70–99)

## 2021-01-28 SURGERY — ESOPHAGOGASTRODUODENOSCOPY (EGD) WITH PROPOFOL
Anesthesia: General

## 2021-01-28 MED ORDER — PROPOFOL 10 MG/ML IV BOLUS
INTRAVENOUS | Status: AC
  Filled 2021-01-28: qty 40

## 2021-01-28 MED ORDER — SODIUM CHLORIDE 0.9 % IV SOLN
INTRAVENOUS | Status: DC
  Administered 2021-01-28: 20 mL/h via INTRAVENOUS

## 2021-01-28 MED ORDER — PROPOFOL 10 MG/ML IV BOLUS
INTRAVENOUS | Status: DC | PRN
  Administered 2021-01-28: 50 mg via INTRAVENOUS

## 2021-01-28 MED ORDER — PROPOFOL 500 MG/50ML IV EMUL
INTRAVENOUS | Status: DC | PRN
  Administered 2021-01-28: 150 ug/kg/min via INTRAVENOUS

## 2021-01-28 NOTE — Transfer of Care (Signed)
Immediate Anesthesia Transfer of Care Note  Patient: Gina Tate  Procedure(s) Performed: ESOPHAGOGASTRODUODENOSCOPY (EGD) WITH PROPOFOL (N/A )  Patient Location: Endoscopy Unit  Anesthesia Type:General  Level of Consciousness: awake, alert  and oriented  Airway & Oxygen Therapy: Patient Spontanous Breathing and Patient connected to nasal cannula oxygen  Post-op Assessment: Report given to RN and Post -op Vital signs reviewed and stable  Post vital signs: Reviewed and stable  Last Vitals:  Vitals Value Taken Time  BP    Temp    Pulse    Resp    SpO2      Last Pain:  Vitals:   01/28/21 0652  TempSrc: Temporal  PainSc: 0-No pain         Complications: No complications documented.

## 2021-01-28 NOTE — Interval H&P Note (Signed)
History and Physical Interval Note:  01/28/2021 7:48 AM  Gina Tate  has presented today for surgery, with the diagnosis of DYSPHAGIA.  The various methods of treatment have been discussed with the patient and family. After consideration of risks, benefits and other options for treatment, the patient has consented to  Procedure(s) with comments: ESOPHAGOGASTRODUODENOSCOPY (EGD) WITH PROPOFOL (N/A) - DM as a surgical intervention.  The patient's history has been reviewed, patient examined, no change in status, stable for surgery.  I have reviewed the patient's chart and labs.  Questions were answered to the patient's satisfaction.     Regis Bill  Ok to proceed with EGD

## 2021-01-28 NOTE — Op Note (Signed)
[facility] Gastroenterology Patient Name: Gina Tate Procedure Date: 01/28/2021 7:48 AM MRN: [id number] Account #: [id number] Date of Birth: 08/26/1951 Admit Type: Outpatient Age: 70 Room: [facility] ENDO ROOM 1 Gender: Female Note Status: Finalized Procedure:             Upper GI endoscopy Indications:           Dysphagia Providers:             Andrey Farmer MD, MD Referring MD:          Biagio Borg, MD (Referring MD) Medicines:             Monitored Anesthesia Care Complications:         No immediate complications. Estimated blood loss:                         Minimal. Procedure:             Pre-Anesthesia Assessment:                        - Prior to the procedure, a History and Physical was                         performed, and patient medications and allergies were                         reviewed. The patient is competent. The risks and                         benefits of the procedure and the sedation options and                         risks were discussed with the patient. All questions                         were answered and informed consent was obtained.                         Patient identification and proposed procedure were                         verified by the physician, the nurse, the anesthetist                         and the technician in the endoscopy suite. Mental                         Status Examination: alert and oriented. Airway                         Examination: normal oropharyngeal airway and neck                         mobility. Respiratory Examination: clear to                         auscultation. CV Examination: normal. Prophylactic                         Antibiotics:  The patient does not require prophylactic                         antibiotics. Prior Anticoagulants: The patient has                         taken no previous anticoagulant or antiplatelet                         agents. ASA Grade Assessment: III - A  patient with                         severe systemic disease. After reviewing the risks and                         benefits, the patient was deemed in satisfactory                         condition to undergo the procedure. The anesthesia                         plan was to use monitored anesthesia care (MAC).                         Immediately prior to administration of medications,                         the patient was re-assessed for adequacy to receive                         sedatives. The heart rate, respiratory rate, oxygen                         saturations, blood pressure, adequacy of pulmonary                         ventilation, and response to care were monitored                         throughout the procedure. The physical status of the                         patient was re-assessed after the procedure.                        After obtaining informed consent, the endoscope was                         passed under direct vision. Throughout the procedure,                         the patient's blood pressure, pulse, and oxygen                         saturations were monitored continuously. The Endoscope                         was introduced through the mouth, and advanced to the  second part of duodenum. The upper GI endoscopy was                         accomplished without difficulty. The patient tolerated                         the procedure well. Findings:      A small hiatal hernia was present.      The examined esophagus was normal. Biopsies were obtained from the       proximal and distal esophagus with cold forceps for histology of       suspected eosinophilic esophagitis. Estimated blood loss was minimal.      The entire examined stomach was normal.      The examined duodenum was normal. Biopsies for histology were taken with       a cold forceps for evaluation of celiac disease. Estimated blood loss       was minimal. Impression:             - Small hiatal hernia.                        - Normal esophagus. Biopsied.                        - Normal stomach.                        - Normal examined duodenum. Biopsied. Recommendation:        - Discharge patient to home.                        - Resume previous diet.                        - Continue present medications.                        - Await pathology results.                        - Return to referring physician as previously                         scheduled. Procedure Code(s):     --- Professional ---                        [phone], Esophagogastroduodenoscopy, flexible,                         transoral; with biopsy, single or multiple Diagnosis Code(s):     --- Professional ---                        K44.9, Diaphragmatic hernia without obstruction or                         gangrene                        R13.10, Dysphagia, unspecified CPT copyright 2019 American Medical Association. All rights reserved. The codes documented in this report are preliminary and upon coder review may  be  revised to meet current compliance requirements. Andrey Farmer MD, MD 01/28/2021 8:07:31 AM Number of Addenda: 0 Note Initiated On: 01/28/2021 7:48 AM Estimated Blood Loss:  Estimated blood loss was minimal.      [facility]

## 2021-01-28 NOTE — H&P (Signed)
Outpatient short stay form Pre-procedure 01/28/2021 7:45 AM Gina Lot MD, MPH  Primary Physician: Dr. Letitia Libra  Reason for visit:  Dysphagia  History of present illness:   70 y/o lady with history of DM II, obesity, and hypothyroidism here for EGD for many year history of dysphagia. Today her biggest complaint is dysphagia to liquids. Had EGD 5 years ago with empiric dilation with minimal effect.    Current Facility-Administered Medications:  .  0.9 %  sodium chloride infusion, , Intravenous, Continuous, Darrelle Wiberg, Rossie Muskrat, MD, Last Rate: 20 mL/hr at 01/28/21 0707, Continued from Pre-op at 01/28/21 0707  Medications Prior to Admission  Medication Sig Dispense Refill Last Dose  . aspirin EC 81 MG tablet Take 81 mg by mouth daily. Swallow whole.   01/27/2021 at Unknown time  . atenolol (TENORMIN) 25 MG tablet Take 25 mg by mouth every evening.   0 01/27/2021 at Unknown time  . atorvastatin (LIPITOR) 10 MG tablet Take 10 mg by mouth daily at 6 PM.   0 01/27/2021 at Unknown time  . Cyanocobalamin 1000 MCG TBCR Take 1 tablet by mouth daily.    01/27/2021 at Unknown time  . escitalopram (LEXAPRO) 20 MG tablet Take 20 mg by mouth daily.   01/27/2021 at Unknown time  . ferrous sulfate 325 (65 FE) MG tablet Take 325 mg by mouth daily with breakfast.    01/27/2021 at Unknown time  . insulin NPH-regular Human (NOVOLIN 70/30) (70-30) 100 UNIT/ML injection Inject 50 Units into the skin 2 (two) times daily with a meal. If blood sugar is 100- 120, 35 units, and  If above 150, it's 40 units   01/27/2021 at Unknown time  . lansoprazole (PREVACID) 15 MG capsule Take 15 mg by mouth daily.    01/27/2021 at Unknown time  . levothyroxine (SYNTHROID, LEVOTHROID) 150 MCG tablet Take 150 mcg by mouth daily before breakfast.    01/27/2021 at Unknown time  . losartan (COZAAR) 100 MG tablet Take 100 mg by mouth daily.    01/27/2021 at Unknown time  . Melatonin 10 MG TABS Take 10 mg by mouth at bedtime.    01/27/2021 at  Unknown time  . metFORMIN (GLUCOPHAGE) 1000 MG tablet Take 1,000 mg by mouth 2 (two) times daily with a meal.    01/27/2021 at Unknown time  . nortriptyline (PAMELOR) 10 MG capsule Take 1 capsule (10 mg total) by mouth at bedtime. 30 capsule 2 01/27/2021 at Unknown time  . venlafaxine XR (EFFEXOR-XR) 75 MG 24 hr capsule Take 75 mg by mouth daily with breakfast.   01/27/2021 at Unknown time  . venlafaxine (EFFEXOR) 75 MG tablet Take 1 tablet (75 mg total) by mouth daily. 30 tablet 2      No Known Allergies   Past Medical History:  Diagnosis Date  . Anemia   . Atypical chest pain   . Bronchitis    chronic  . Chronic cough   . Dementia (HCC)   . Depression    unspecified  . Diabetes mellitus type 2, uncomplicated (HCC)   . Diabetes mellitus, type II (HCC)   . DVT (deep venous thrombosis) (HCC)    FOLLOWING TKR  . Dyspnea   . Dysrhythmia   . Elevated transaminase level    chronic, probably on basis of steatorrhea syndrome  . Environmental and seasonal allergies   . Fracture of right patella    not operated  . GERD (gastroesophageal reflux disease)   . Headache   . Headache,  variant migraine   . Hiatal hernia   . History of cervical dysplasia    status post two normal pap smears, 02/2002 and 05/2002, status post LEEP  . History of hiatal hernia   . HOH (hard of hearing)   . Hypercholesteremia   . Hyperlipidemia, unspecified   . Hypertension   . Hypothyroidism   . Morton's neuroma, left   . Obesity, unspecified   . Osteoarthritis   . Pneumonia   . Stroke [facility])    TIA  . Thyroid disease   . TIA (transient ischemic attack)   . Wheezing     Review of systems:  Otherwise negative.    Physical Exam  Gen: Alert, oriented. Appears stated age.  HEENT:  PERRLA. Lungs: No respiratory distress CV: RRR Abd: soft, benign, no masses Ext: No edema    Planned procedures: Proceed with EGD. The patient understands the nature of the planned procedure, indications, risks,  alternatives and potential complications including but not limited to bleeding, infection, perforation, damage to internal organs and possible oversedation/side effects from anesthesia. The patient agrees and gives consent to proceed.  Please refer to procedure notes for findings, recommendations and patient disposition/instructions.     Gina Lot MD, MPH Gastroenterology 01/28/2021  7:45 AM

## 2021-01-28 NOTE — Anesthesia Preprocedure Evaluation (Addendum)
Anesthesia Evaluation  Patient identified by MRN, date of birth, ID band Patient awake    Reviewed: Allergy & Precautions, NPO status , Patient's Chart, lab work & pertinent test results  History of Anesthesia Complications Negative for: history of anesthetic complications  Airway Mallampati: III  TM Distance: <3 FB Neck ROM: limited    Dental  (+) Chipped   Pulmonary shortness of breath and with exertion, pneumonia,    Pulmonary exam normal        Cardiovascular Exercise Tolerance: Good hypertension, Normal cardiovascular exam+ dysrhythmias      Neuro/Psych  Headaches, PSYCHIATRIC DISORDERS TIA Neuromuscular disease CVA    GI/Hepatic Neg liver ROS, hiatal hernia, GERD  Medicated and Controlled,  Endo/Other  negative endocrine ROSdiabetesHypothyroidism   Renal/GU negative Renal ROS  negative genitourinary   Musculoskeletal  (+) Arthritis ,   Abdominal   Peds  Hematology negative hematology ROS (+)   Anesthesia Other Findings Past Medical History: No date: Anemia No date: Atypical chest pain No date: Bronchitis     Comment:  chronic No date: Chronic cough No date: Dementia (HCC) No date: Depression     Comment:  unspecified No date: Diabetes mellitus type 2, uncomplicated (HCC) No date: Diabetes mellitus, type II (HCC) No date: DVT (deep venous thrombosis) (HCC)     Comment:  FOLLOWING TKR No date: Dyspnea No date: Dysrhythmia No date: Elevated transaminase level     Comment:  chronic, probably on basis of steatorrhea syndrome No date: Environmental and seasonal allergies No date: Fracture of right patella     Comment:  not operated No date: GERD (gastroesophageal reflux disease) No date: Headache No date: Headache, variant migraine No date: Hiatal hernia No date: History of cervical dysplasia     Comment:  status post two normal pap smears, 02/2002 and 05/2002,               status post LEEP No  date: History of hiatal hernia No date: HOH (hard of hearing) No date: Hypercholesteremia No date: Hyperlipidemia, unspecified No date: Hypertension No date: Hypothyroidism No date: Morton's neuroma, left No date: Obesity, unspecified No date: Osteoarthritis No date: Pneumonia No date: Stroke [facility])     Comment:  TIA No date: Thyroid disease No date: TIA (transient ischemic attack) No date: Wheezing  Past Surgical History: No date: BACK SURGERY 02/15/2018: CATARACT EXTRACTION W/PHACO; Right     Comment:  Procedure: CATARACT EXTRACTION PHACO AND INTRAOCULAR               LENS PLACEMENT (IOC);  Surgeon: Lockie Mola, MD;              Location: [facility] ORS;  Service: Ophthalmology;  Laterality:              Right;  [place]  00:55 AP% 16.3 CDE 8.99 Fluid pack lot #               [id number] 03/10/2018: CATARACT EXTRACTION W/PHACO; Left     Comment:  Procedure: CATARACT EXTRACTION PHACO AND INTRAOCULAR               LENS PLACEMENT (IOC);  Surgeon: Lockie Mola, MD;              Location: [facility] ORS;  Service: Ophthalmology;  Laterality:              Left;  Lot # [id number] H [place] 0:55.6 AP 12.5% CDE 6.95 12/07/2001: CERVICAL BIOPSY  W/ LOOP ELECTRODE EXCISION 10/07/2015: ESOPHAGOGASTRODUODENOSCOPY; N/A  Comment:  Procedure: ESOPHAGOGASTRODUODENOSCOPY (EGD);  Surgeon:               Wallace Cullens, MD;  Location: [facility] ENDOSCOPY;  Service:               Gastroenterology;  Laterality: N/A; 04/28/1993: ESOPHAGOGASTRODUODENOSCOPY     Comment:  Revealed moderate grade 2 distal esophagitis and small,               intermittent hiatal hernia.  No date: HAND SURGERY; Right No date: JOINT REPLACEMENT No date: SPINE SURGERY No date: TONSILLECTOMY No date: TONSILLECTOMY 04/22/2017: TOTAL KNEE ARTHROPLASTY; Left     Comment:  Procedure: TOTAL KNEE ARTHROPLASTY;  Surgeon: Kennedy Bucker, MD;  Location: [facility] ORS;  Service: Orthopedics;               Laterality: Left; No date: TRIGGER  FINGER RELEASE; Right 04/29/2015: TRIGGER FINGER RELEASE; Right     Comment:  right long finger  ; Dr. Rosita Kea No date: TUBAL LIGATION  BMI    Body Mass Index: 36.11 kg/m      Reproductive/Obstetrics negative OB ROS                             Anesthesia Physical Anesthesia Plan  ASA: III  Anesthesia Plan: General   Post-op Pain Management:    Induction: Intravenous  PONV Risk Score and Plan: Propofol infusion and TIVA  Airway Management Planned: Natural Airway and Nasal Cannula  Additional Equipment:   Intra-op Plan:   Post-operative Plan:   Informed Consent: I have reviewed the patients History and Physical, chart, labs and discussed the procedure including the risks, benefits and alternatives for the proposed anesthesia with the patient or authorized representative who has indicated his/her understanding and acceptance.     Dental Advisory Given  Plan Discussed with: Anesthesiologist, CRNA and Surgeon  Anesthesia Plan Comments: (Patient and sister [phone] consented for risks of anesthesia including but not limited to:  - adverse reactions to medications - risk of airway placement if required - damage to eyes, teeth, lips or other oral mucosa - nerve damage due to positioning  - sore throat or hoarseness - Damage to heart, brain, nerves, lungs, other parts of body or loss of life  They voiced understanding.)       Anesthesia Quick Evaluation

## 2021-01-28 NOTE — Anesthesia Postprocedure Evaluation (Signed)
Anesthesia Post Note  Patient: [name]  Procedure(s) Performed: ESOPHAGOGASTRODUODENOSCOPY (EGD) WITH PROPOFOL (N/A )  Patient location during evaluation: Endoscopy Anesthesia Type: General Level of consciousness: awake and alert Pain management: pain level controlled Vital Signs Assessment: post-procedure vital signs reviewed and stable Respiratory status: spontaneous breathing, nonlabored ventilation, respiratory function stable and patient connected to nasal cannula oxygen Cardiovascular status: blood pressure returned to baseline and stable Postop Assessment: no apparent nausea or vomiting Anesthetic complications: no   No complications documented.   Last Vitals:  Vitals:   01/28/21 0817 01/28/21 0827  BP: (!) 112/59 (!) 124/58  Pulse: 77   Resp: 15   Temp:    SpO2: 99%     Last Pain:  Vitals:   01/28/21 0817  TempSrc:   PainSc: 0-No pain                 Cleda Mccreedy Embree Brawley

## 2021-01-29 ENCOUNTER — Encounter: Payer: Self-pay | Admitting: Gastroenterology

## 2021-01-29 LAB — SURGICAL PATHOLOGY

## 2021-01-31 ENCOUNTER — Other Ambulatory Visit: Payer: Self-pay | Admitting: Orthopedic Surgery

## 2021-01-31 DIAGNOSIS — M87052 Idiopathic aseptic necrosis of left femur: Secondary | ICD-10-CM

## 2021-02-04 ENCOUNTER — Other Ambulatory Visit: Payer: Self-pay | Admitting: Gastroenterology

## 2021-02-04 DIAGNOSIS — R1319 Other dysphagia: Secondary | ICD-10-CM

## 2021-02-12 ENCOUNTER — Ambulatory Visit
Admission: RE | Admit: 2021-02-12 | Discharge: 2021-02-12 | Disposition: A | Discharge status: Home / Self Care | Payer: Medicare Other | Source: Ambulatory Visit | Attending: Orthopedic Surgery | Admitting: Orthopedic Surgery

## 2021-02-12 ENCOUNTER — Other Ambulatory Visit: Payer: Self-pay

## 2021-02-12 DIAGNOSIS — M87052 Idiopathic aseptic necrosis of left femur: Secondary | ICD-10-CM | POA: Insufficient documentation

## 2021-02-18 ENCOUNTER — Ambulatory Visit
Admission: RE | Admit: 2021-02-18 | Discharge: 2021-02-18 | Disposition: A | Discharge status: Home / Self Care | Payer: Medicare Other | Source: Ambulatory Visit | Attending: Gastroenterology | Admitting: Gastroenterology

## 2021-02-18 ENCOUNTER — Other Ambulatory Visit: Payer: Self-pay

## 2021-02-18 DIAGNOSIS — R1319 Other dysphagia: Secondary | ICD-10-CM | POA: Diagnosis not present

## 2021-02-28 ENCOUNTER — Other Ambulatory Visit: Payer: Self-pay | Admitting: Orthopedic Surgery

## 2021-03-20 ENCOUNTER — Other Ambulatory Visit
Admission: RE | Admit: 2021-03-20 | Discharge: 2021-03-20 | Disposition: A | Discharge status: Home / Self Care | Payer: Medicare Other | Source: Ambulatory Visit | Attending: Orthopedic Surgery | Admitting: Orthopedic Surgery

## 2021-03-20 ENCOUNTER — Other Ambulatory Visit: Payer: Self-pay

## 2021-03-20 DIAGNOSIS — Z01818 Encounter for other preprocedural examination: Secondary | ICD-10-CM | POA: Diagnosis present

## 2021-03-20 LAB — COMPREHENSIVE METABOLIC PANEL
ALT: 16 U/L (ref 0–44)
AST: 20 U/L (ref 15–41)
Albumin: 3.9 g/dL (ref 3.5–5.0)
Alkaline Phosphatase: 92 U/L (ref 38–126)
Anion gap: 9 (ref 5–15)
BUN: 13 mg/dL (ref 8–23)
CO2: 28 mmol/L (ref 22–32)
Calcium: 9.2 mg/dL (ref 8.9–10.3)
Chloride: 97 mmol/L — ABNORMAL LOW (ref 98–111)
Creatinine, Ser: 0.8 mg/dL (ref 0.44–1.00)
GFR, Estimated: >60 mL/min (ref >60)
Glucose, Bld: 95 mg/dL (ref 70–99)
Potassium: 4.3 mmol/L (ref 3.5–5.1)
Sodium: 134 mmol/L — ABNORMAL LOW (ref 135–145)
Total Bilirubin: 0.6 mg/dL (ref 0.3–1.2)
Total Protein: 7.5 g/dL (ref 6.5–8.1)

## 2021-03-20 LAB — URINALYSIS, ROUTINE W REFLEX MICROSCOPIC
Bacteria, UA: NONE SEEN
Bilirubin Urine: NEGATIVE
Glucose, UA: NEGATIVE mg/dL
Hgb urine dipstick: NEGATIVE
Ketones, ur: NEGATIVE mg/dL
Nitrite: NEGATIVE
Protein, ur: NEGATIVE mg/dL
Specific Gravity, Urine: 1.012 (ref 1.005–1.030)
pH: 6 (ref 5.0–8.0)

## 2021-03-20 LAB — CBC WITH DIFFERENTIAL/PLATELET
Abs Immature Granulocytes: 0.03 10*3/uL (ref 0.00–0.07)
Basophils Absolute: 0.1 10*3/uL (ref 0.0–0.1)
Basophils Relative: 1 %
Eosinophils Absolute: 0.2 10*3/uL (ref 0.0–0.5)
Eosinophils Relative: 2 %
HCT: 38 % (ref 36.0–46.0)
Hemoglobin: 12.6 g/dL (ref 12.0–15.0)
Immature Granulocytes: 0 %
Lymphocytes Relative: 36 %
Lymphs Abs: 3.3 10*3/uL (ref 0.7–4.0)
MCH: 29.9 pg (ref 26.0–34.0)
MCHC: 33.2 g/dL (ref 30.0–36.0)
MCV: 90.3 fL (ref 80.0–100.0)
Monocytes Absolute: 0.6 10*3/uL (ref 0.1–1.0)
Monocytes Relative: 7 %
Neutro Abs: 4.8 10*3/uL (ref 1.7–7.7)
Neutrophils Relative %: 54 %
Platelets: 319 10*3/uL (ref 150–400)
RBC: 4.21 MIL/uL (ref 3.87–5.11)
RDW: 13.3 % (ref 11.5–15.5)
WBC: 9 10*3/uL (ref 4.0–10.5)
nRBC: 0 % (ref 0.0–0.2)

## 2021-03-20 LAB — SURGICAL PCR SCREEN
MRSA, PCR: NEGATIVE
Staphylococcus aureus: NEGATIVE

## 2021-03-20 NOTE — Patient Instructions (Addendum)
Your procedure is scheduled on:04-01-21 Tuesday Report to the Registration Desk on the 1st floor of the Medical Mall-Then proceed to the 2nd floor Surgery Desk in the Medical Mall To find out your arrival time, please call [phone] between 1PM - 3PM on:03-31-21 Monday  REMEMBER: Instructions that are not followed completely may result in serious medical risk, up to and including death; or upon the discretion of your surgeon and anesthesiologist your surgery may need to be rescheduled.  Do not eat food after midnight the night before surgery.  No gum chewing, lozengers or hard candies.  You may however, drink WATER up to 2 hours before you are scheduled to arrive for your surgery. Do not drink anything within 2 hours of your scheduled arrival time.  Type 1 and Type 2 diabetics should only drink water.  TAKE THESE MEDICATIONS THE MORNING OF SURGERY WITH A SIP OF WATER: -Euthyrox -Prevacid (Lansoprazole) -Lexapro (Escitalopram)  Stop Metformin 2 days prior to surgery-Last dose on 03-29-21 Saturday  Take 1/2 of usual insulin (Novolin 70/30) dose the night before surgery and none on the morning of surgery.  Call Dr Neomia Glass office today 03-20-21 and ask about when to stop your 81 mg Aspirin [phone])  One week prior to surgery: Stop Anti-inflammatories (NSAIDS) such as Mobic (Meloxicam), Advil, Aleve, Ibuprofen, Motrin, Naproxen, Naprosyn and Aspirin based products such as Excedrin, Goodys Powder, BC Powder.You may however, continue to take Tylenol if needed for pain up until the day of surgery.  Stop ANY OVER THE COUNTER supplements/vitamins 7 days prior to surgery (Ferrous Sulfate) You may however, continue to take Tylenol if needed for pain up until the day of surgery.  No Alcohol for 24 hours before or after surgery.  No Smoking including e-cigarettes for 24 hours prior to surgery.  No chewable tobacco products for at least 6 hours prior to surgery.  No nicotine patches on the  day of surgery.  Do not use any "recreational" drugs for at least a week prior to your surgery.  Please be advised that the combination of cocaine and anesthesia may have negative outcomes, up to and including death. If you test positive for cocaine, your surgery will be cancelled.  On the morning of surgery brush your teeth with toothpaste and water, you may rinse your mouth with mouthwash if you wish. Do not swallow any toothpaste or mouthwash.  Do not wear jewelry, make-up, hairpins, clips or nail polish.  Do not wear lotions, powders, or perfumes.   Do not shave body from the neck down 48 hours prior to surgery just in case you cut yourself which could leave a site for infection.  Also, freshly shaved skin may become irritated if using the CHG soap.  Contact lenses, hearing aids and dentures may not be worn into surgery.  Do not bring valuables to the hospital. [facility] is not responsible for any missing/lost belongings or valuables.   Use CHG Soap as directed on instruction sheet  Notify your doctor if there is any change in your medical condition (cold, fever, infection).  Wear comfortable clothing (specific to your surgery type) to the hospital.  After surgery, you can help prevent lung complications by doing breathing exercises.  Take deep breaths and cough every 1-2 hours. Your doctor may order a device called an Incentive Spirometer to help you take deep breaths. When coughing or sneezing, hold a pillow firmly against your incision with both hands. This is called "splinting." Doing this helps protect your incision.  It also decreases belly discomfort.  If you are being admitted to the hospital overnight, leave your suitcase in the car. After surgery it may be brought to your room.  If you are being discharged the day of surgery, you will not be allowed to drive home. You will need a responsible adult (18 years or older) to drive you home and stay with you that night.    If you are taking public transportation, you will need to have a responsible adult (18 years or older) with you. Please confirm with your physician that it is acceptable to use public transportation.   Please call the Pre-admissions Testing Dept. at [phone] if you have any questions about these instructions.  Surgery Visitation Policy:  Patients undergoing a surgery or procedure may have one family member or support person with them as long as that person is not COVID-19 positive or experiencing its symptoms.  That person may remain in the waiting area during the procedure.  Inpatient Visitation:    Visiting hours are 7 a.m. to 8 p.m. Inpatients will be allowed two visitors daily. The visitors may change each day during the patient's stay. No visitors under the age of 18. Any visitor under the age of 49 must be accompanied by an adult. The visitor must pass COVID-19 screenings, use hand sanitizer when entering and exiting the patient's room and wear a mask at all times, including in the patient's room. Patients must also wear a mask when staff or their visitor are in the room. Masking is required regardless of vaccination status.

## 2021-03-20 NOTE — Pre-Procedure Instructions (Signed)
Pt refuses blood (she is NOT a Jehovah's witness). She states that a MD told her that not all blood is checked and she would rather die than take blood that is not screened. I told her that the blood has to be screened prior to receiving blood. Pt still refusing and states "If it is my time to go then its my time to go and I am ready."So even in an emergency if she needed blood and knew she would die without it she still refuses. Pt signed blood refusal form for her upcoming total hip arthroplasty.

## 2021-03-24 ENCOUNTER — Other Ambulatory Visit: Payer: Self-pay

## 2021-03-24 ENCOUNTER — Ambulatory Visit
Admission: RE | Admit: 2021-03-24 | Discharge: 2021-03-24 | Disposition: A | Discharge status: Home / Self Care | Payer: Medicare Other | Source: Ambulatory Visit | Attending: Internal Medicine | Admitting: Internal Medicine

## 2021-03-24 DIAGNOSIS — R918 Other nonspecific abnormal finding of lung field: Secondary | ICD-10-CM | POA: Insufficient documentation

## 2021-03-24 DIAGNOSIS — R0602 Shortness of breath: Secondary | ICD-10-CM | POA: Insufficient documentation

## 2021-03-28 ENCOUNTER — Other Ambulatory Visit
Admission: RE | Admit: 2021-03-28 | Discharge: 2021-03-28 | Disposition: A | Discharge status: Home / Self Care | Payer: Medicare Other | Source: Ambulatory Visit | Attending: Orthopedic Surgery | Admitting: Orthopedic Surgery

## 2021-03-28 ENCOUNTER — Other Ambulatory Visit: Payer: Self-pay

## 2021-03-28 DIAGNOSIS — Z01812 Encounter for preprocedural laboratory examination: Secondary | ICD-10-CM | POA: Insufficient documentation

## 2021-03-28 DIAGNOSIS — Z20822 Contact with and (suspected) exposure to covid-19: Secondary | ICD-10-CM | POA: Diagnosis not present

## 2021-03-28 LAB — SARS CORONAVIRUS 2 (TAT 6-24 HRS): SARS Coronavirus 2: NEGATIVE

## 2021-03-31 MED ORDER — CEFAZOLIN SODIUM-DEXTROSE 2-4 GM/100ML-% IV SOLN
2.0000 g | INTRAVENOUS | Status: AC
  Administered 2021-04-01: 2 g via INTRAVENOUS

## 2021-03-31 MED ORDER — SODIUM CHLORIDE 0.9 % IV SOLN
INTRAVENOUS | Status: DC
Start: 2021-03-31 — End: 2021-04-01

## 2021-03-31 MED ORDER — ORAL CARE MOUTH RINSE: 15.0000 mL | Freq: Once | OROMUCOSAL | Status: AC

## 2021-03-31 MED ORDER — CHLORHEXIDINE GLUCONATE 0.12 % MT SOLN: 15.0000 mL | Freq: Once | OROMUCOSAL | Status: AC

## 2021-04-01 ENCOUNTER — Ambulatory Visit: Payer: Medicare Other | Admitting: Anesthesiology

## 2021-04-01 ENCOUNTER — Inpatient Hospital Stay: Payer: Medicare Other

## 2021-04-01 ENCOUNTER — Ambulatory Visit: Payer: Medicare Other | Admitting: Urgent Care

## 2021-04-01 ENCOUNTER — Inpatient Hospital Stay
Admission: RE | Admit: 2021-04-01 | Discharge: 2021-04-05 | DRG: 470 | Disposition: A | Discharge status: Home Health | Payer: Medicare Other | Attending: Orthopedic Surgery | Admitting: Orthopedic Surgery

## 2021-04-01 ENCOUNTER — Encounter: Payer: Self-pay | Admitting: Orthopedic Surgery

## 2021-04-01 ENCOUNTER — Encounter
Admission: RE | Disposition: A | Discharge status: Home Health | Payer: Self-pay | Source: Home / Self Care | Attending: Orthopedic Surgery

## 2021-04-01 ENCOUNTER — Ambulatory Visit: Payer: Medicare Other

## 2021-04-01 ENCOUNTER — Other Ambulatory Visit: Payer: Self-pay

## 2021-04-01 DIAGNOSIS — E039 Hypothyroidism, unspecified: Secondary | ICD-10-CM | POA: Diagnosis present

## 2021-04-01 DIAGNOSIS — F0390 Unspecified dementia without behavioral disturbance: Secondary | ICD-10-CM | POA: Diagnosis present

## 2021-04-01 DIAGNOSIS — M879 Osteonecrosis, unspecified: Secondary | ICD-10-CM | POA: Diagnosis present

## 2021-04-01 DIAGNOSIS — I951 Orthostatic hypotension: Secondary | ICD-10-CM | POA: Diagnosis not present

## 2021-04-01 DIAGNOSIS — Z7984 Long term (current) use of oral hypoglycemic drugs: Secondary | ICD-10-CM

## 2021-04-01 DIAGNOSIS — D62 Acute posthemorrhagic anemia: Secondary | ICD-10-CM | POA: Diagnosis not present

## 2021-04-01 DIAGNOSIS — Z6836 Body mass index (BMI) 36.0-36.9, adult: Secondary | ICD-10-CM

## 2021-04-01 DIAGNOSIS — E7800 Pure hypercholesterolemia, unspecified: Secondary | ICD-10-CM | POA: Diagnosis present

## 2021-04-01 DIAGNOSIS — G8918 Other acute postprocedural pain: Secondary | ICD-10-CM

## 2021-04-01 DIAGNOSIS — Z8616 Personal history of COVID-19: Secondary | ICD-10-CM | POA: Diagnosis not present

## 2021-04-01 DIAGNOSIS — Z87410 Personal history of cervical dysplasia: Secondary | ICD-10-CM | POA: Diagnosis not present

## 2021-04-01 DIAGNOSIS — I10 Essential (primary) hypertension: Secondary | ICD-10-CM | POA: Diagnosis present

## 2021-04-01 DIAGNOSIS — Z8249 Family history of ischemic heart disease and other diseases of the circulatory system: Secondary | ICD-10-CM

## 2021-04-01 DIAGNOSIS — R918 Other nonspecific abnormal finding of lung field: Secondary | ICD-10-CM | POA: Diagnosis present

## 2021-04-01 DIAGNOSIS — Z8701 Personal history of pneumonia (recurrent): Secondary | ICD-10-CM | POA: Diagnosis not present

## 2021-04-01 DIAGNOSIS — Z86718 Personal history of other venous thrombosis and embolism: Secondary | ICD-10-CM

## 2021-04-01 DIAGNOSIS — Z419 Encounter for procedure for purposes other than remedying health state, unspecified: Secondary | ICD-10-CM

## 2021-04-01 DIAGNOSIS — Z79899 Other long term (current) drug therapy: Secondary | ICD-10-CM | POA: Diagnosis not present

## 2021-04-01 DIAGNOSIS — E119 Type 2 diabetes mellitus without complications: Secondary | ICD-10-CM | POA: Diagnosis present

## 2021-04-01 DIAGNOSIS — K219 Gastro-esophageal reflux disease without esophagitis: Secondary | ICD-10-CM | POA: Diagnosis present

## 2021-04-01 DIAGNOSIS — Z833 Family history of diabetes mellitus: Secondary | ICD-10-CM

## 2021-04-01 DIAGNOSIS — F32A Depression, unspecified: Secondary | ICD-10-CM | POA: Diagnosis present

## 2021-04-01 DIAGNOSIS — Z79890 Hormone replacement therapy (postmenopausal): Secondary | ICD-10-CM | POA: Diagnosis not present

## 2021-04-01 DIAGNOSIS — Z7982 Long term (current) use of aspirin: Secondary | ICD-10-CM

## 2021-04-01 DIAGNOSIS — H9190 Unspecified hearing loss, unspecified ear: Secondary | ICD-10-CM | POA: Diagnosis present

## 2021-04-01 DIAGNOSIS — K449 Diaphragmatic hernia without obstruction or gangrene: Secondary | ICD-10-CM | POA: Diagnosis present

## 2021-04-01 DIAGNOSIS — Z791 Long term (current) use of non-steroidal anti-inflammatories (NSAID): Secondary | ICD-10-CM | POA: Diagnosis not present

## 2021-04-01 DIAGNOSIS — Z8673 Personal history of transient ischemic attack (TIA), and cerebral infarction without residual deficits: Secondary | ICD-10-CM

## 2021-04-01 DIAGNOSIS — Z820 Family history of epilepsy and other diseases of the nervous system: Secondary | ICD-10-CM

## 2021-04-01 DIAGNOSIS — Z9851 Tubal ligation status: Secondary | ICD-10-CM | POA: Diagnosis not present

## 2021-04-01 DIAGNOSIS — Z96642 Presence of left artificial hip joint: Secondary | ICD-10-CM

## 2021-04-01 DIAGNOSIS — E669 Obesity, unspecified: Secondary | ICD-10-CM | POA: Diagnosis present

## 2021-04-01 HISTORY — PX: TOTAL HIP ARTHROPLASTY: SHX124

## 2021-04-01 LAB — CBC
HCT: 33.5 % — ABNORMAL LOW (ref 36.0–46.0)
Hemoglobin: 11 g/dL — ABNORMAL LOW (ref 12.0–15.0)
MCH: 30.4 pg (ref 26.0–34.0)
MCHC: 32.8 g/dL (ref 30.0–36.0)
MCV: 92.5 fL (ref 80.0–100.0)
Platelets: 269 10*3/uL (ref 150–400)
RBC: 3.62 MIL/uL — ABNORMAL LOW (ref 3.87–5.11)
RDW: 13.2 % (ref 11.5–15.5)
WBC: 16.5 10*3/uL — ABNORMAL HIGH (ref 4.0–10.5)
nRBC: 0 % (ref 0.0–0.2)

## 2021-04-01 LAB — TYPE AND SCREEN
ABO/RH(D): A POS
Antibody Screen: NEGATIVE

## 2021-04-01 LAB — CREATININE, SERUM
Creatinine, Ser: 0.8 mg/dL (ref 0.44–1.00)
GFR, Estimated: >60 mL/min (ref >60)

## 2021-04-01 LAB — GLUCOSE, CAPILLARY
Glucose-Capillary: 202 mg/dL — ABNORMAL HIGH (ref 70–99)
Glucose-Capillary: 184 mg/dL — ABNORMAL HIGH (ref 70–99)
Glucose-Capillary: 263 mg/dL — ABNORMAL HIGH (ref 70–99)
Glucose-Capillary: 275 mg/dL — ABNORMAL HIGH (ref 70–99)

## 2021-04-01 SURGERY — ARTHROPLASTY, HIP, TOTAL, ANTERIOR APPROACH
Anesthesia: General | Site: Hip | Laterality: Left

## 2021-04-01 MED ORDER — FENTANYL CITRATE (PF) 100 MCG/2ML IJ SOLN
INTRAMUSCULAR | Status: DC | PRN
  Administered 2021-04-01: 25 ug via INTRAVENOUS
  Administered 2021-04-01: 50 ug via INTRAVENOUS
  Administered 2021-04-01: 25 ug via INTRAVENOUS

## 2021-04-01 MED ORDER — FENTANYL CITRATE (PF) 100 MCG/2ML IJ SOLN
25.0000 ug | INTRAMUSCULAR | Status: DC | PRN
  Administered 2021-04-01 (×3): 25 ug via INTRAVENOUS

## 2021-04-01 MED ORDER — BISACODYL 10 MG RE SUPP
10.0000 mg | Freq: Every day | RECTAL | Status: DC | PRN
  Administered 2021-04-02: 10 mg via RECTAL
  Filled 2021-04-01: qty 1

## 2021-04-01 MED ORDER — LIDOCAINE HCL (CARDIAC) PF 100 MG/5ML IV SOSY
PREFILLED_SYRINGE | INTRAVENOUS | Status: DC | PRN
  Administered 2021-04-01: 100 mg via INTRAVENOUS

## 2021-04-01 MED ORDER — ROCURONIUM BROMIDE 100 MG/10ML IV SOLN
INTRAVENOUS | Status: DC | PRN
  Administered 2021-04-01: 15 mg via INTRAVENOUS
  Administered 2021-04-01: 10 mg via INTRAVENOUS
  Administered 2021-04-01: 30 mg via INTRAVENOUS

## 2021-04-01 MED ORDER — ONDANSETRON HCL 4 MG PO TABS: 4.0000 mg | ORAL_TABLET | Freq: Four times a day (QID) | ORAL | Status: DC | PRN

## 2021-04-01 MED ORDER — SODIUM CHLORIDE 0.9 % IJ SOLN
INTRAMUSCULAR | Status: DC | PRN
  Administered 2021-04-01: 90 mL

## 2021-04-01 MED ORDER — INSULIN ASPART 100 UNIT/ML IJ SOLN
5.0000 [IU] | Freq: Once | INTRAMUSCULAR | Status: AC
  Administered 2021-04-01: 5 [IU] via SUBCUTANEOUS

## 2021-04-01 MED ORDER — HYDROMORPHONE HCL 1 MG/ML IJ SOLN
INTRAMUSCULAR | Status: AC
  Administered 2021-04-01: 0.25 mg via INTRAVENOUS
  Filled 2021-04-01: qty 1

## 2021-04-01 MED ORDER — HYDROCODONE-ACETAMINOPHEN 7.5-325 MG PO TABS
1.0000 | ORAL_TABLET | ORAL | Status: DC | PRN
  Administered 2021-04-02: 1 via ORAL
  Filled 2021-04-01 (×2): qty 1

## 2021-04-01 MED ORDER — ACETAMINOPHEN 10 MG/ML IV SOLN
INTRAVENOUS | Status: AC
  Administered 2021-04-01: 1000 mg via INTRAVENOUS
  Filled 2021-04-01: qty 100

## 2021-04-01 MED ORDER — ONDANSETRON HCL 4 MG/2ML IJ SOLN
INTRAMUSCULAR | Status: DC | PRN
  Administered 2021-04-01: 4 mg via INTRAVENOUS

## 2021-04-01 MED ORDER — ONDANSETRON HCL 4 MG/2ML IJ SOLN: 4.0000 mg | Freq: Four times a day (QID) | INTRAMUSCULAR | Status: DC | PRN

## 2021-04-01 MED ORDER — CEFAZOLIN SODIUM-DEXTROSE 2-4 GM/100ML-% IV SOLN
INTRAVENOUS | Status: AC
  Administered 2021-04-01: 2 g via INTRAVENOUS
  Filled 2021-04-01: qty 100

## 2021-04-01 MED ORDER — SUCCINYLCHOLINE CHLORIDE 200 MG/10ML IV SOSY
PREFILLED_SYRINGE | INTRAVENOUS | Status: AC
  Filled 2021-04-01: qty 10

## 2021-04-01 MED ORDER — VENLAFAXINE HCL ER 75 MG PO CP24
75.0000 mg | ORAL_CAPSULE | Freq: Every day | ORAL | Status: DC
  Administered 2021-04-01 – 2021-04-04 (×4): 75 mg via ORAL
  Filled 2021-04-01 (×5): qty 1

## 2021-04-01 MED ORDER — MENTHOL 3 MG MT LOZG
1.0000 | LOZENGE | OROMUCOSAL | Status: DC | PRN
  Filled 2021-04-01: qty 9

## 2021-04-01 MED ORDER — ROCURONIUM BROMIDE 10 MG/ML (PF) SYRINGE
PREFILLED_SYRINGE | INTRAVENOUS | Status: AC
  Filled 2021-04-01: qty 10

## 2021-04-01 MED ORDER — ACETAMINOPHEN 10 MG/ML IV SOLN: 1000.0000 mg | Freq: Once | INTRAVENOUS | Status: DC | PRN

## 2021-04-01 MED ORDER — CHLORHEXIDINE GLUCONATE 0.12 % MT SOLN
OROMUCOSAL | Status: AC
  Administered 2021-04-01: 15 mL via OROMUCOSAL
  Filled 2021-04-01: qty 15

## 2021-04-01 MED ORDER — ATORVASTATIN CALCIUM 10 MG PO TABS
10.0000 mg | ORAL_TABLET | Freq: Every day | ORAL | Status: DC
  Administered 2021-04-01 – 2021-04-04 (×4): 10 mg via ORAL
  Filled 2021-04-01 (×4): qty 1

## 2021-04-01 MED ORDER — VASOPRESSIN 20 UNIT/ML IV SOLN
INTRAVENOUS | Status: DC | PRN
  Administered 2021-04-01 (×2): 1 [IU] via INTRAVENOUS

## 2021-04-01 MED ORDER — TRAMADOL HCL 50 MG PO TABS
50.0000 mg | ORAL_TABLET | Freq: Four times a day (QID) | ORAL | Status: DC
  Administered 2021-04-01 – 2021-04-04 (×10): 50 mg via ORAL
  Filled 2021-04-01 (×11): qty 1

## 2021-04-01 MED ORDER — VENLAFAXINE HCL ER 75 MG PO CP24: 75.0000 mg | ORAL_CAPSULE | Freq: Every day | ORAL | Status: DC

## 2021-04-01 MED ORDER — INSULIN ASPART 100 UNIT/ML IJ SOLN
INTRAMUSCULAR | Status: AC
  Filled 2021-04-01: qty 1

## 2021-04-01 MED ORDER — ASPIRIN EC 81 MG PO TBEC
81.0000 mg | DELAYED_RELEASE_TABLET | Freq: Every day | ORAL | Status: DC
  Administered 2021-04-01 – 2021-04-04 (×4): 81 mg via ORAL
  Filled 2021-04-01 (×4): qty 1

## 2021-04-01 MED ORDER — BUPIVACAINE LIPOSOME 1.3 % IJ SUSP
INTRAMUSCULAR | Status: AC
  Filled 2021-04-01: qty 20

## 2021-04-01 MED ORDER — DOCUSATE SODIUM 100 MG PO CAPS
100.0000 mg | ORAL_CAPSULE | Freq: Two times a day (BID) | ORAL | Status: DC
  Administered 2021-04-02 – 2021-04-05 (×6): 100 mg via ORAL
  Filled 2021-04-01 (×7): qty 1

## 2021-04-01 MED ORDER — PANTOPRAZOLE SODIUM 40 MG PO TBEC
40.0000 mg | DELAYED_RELEASE_TABLET | Freq: Every day | ORAL | Status: DC
  Administered 2021-04-02 – 2021-04-05 (×4): 40 mg via ORAL
  Filled 2021-04-01 (×4): qty 1

## 2021-04-01 MED ORDER — EPHEDRINE 5 MG/ML INJ
INTRAVENOUS | Status: AC
  Filled 2021-04-01: qty 10

## 2021-04-01 MED ORDER — HYDROCODONE-ACETAMINOPHEN 5-325 MG PO TABS
1.0000 | ORAL_TABLET | ORAL | Status: DC | PRN
  Administered 2021-04-02 – 2021-04-04 (×3): 1 via ORAL
  Filled 2021-04-01 (×3): qty 1

## 2021-04-01 MED ORDER — PROPOFOL 10 MG/ML IV BOLUS
INTRAVENOUS | Status: AC
  Filled 2021-04-01: qty 20

## 2021-04-01 MED ORDER — CEFAZOLIN SODIUM-DEXTROSE 2-4 GM/100ML-% IV SOLN
2.0000 g | Freq: Four times a day (QID) | INTRAVENOUS | Status: AC
  Administered 2021-04-02: 2 g via INTRAVENOUS
  Filled 2021-04-01 (×2): qty 100

## 2021-04-01 MED ORDER — METOCLOPRAMIDE HCL 10 MG PO TABS
5.0000 mg | ORAL_TABLET | Freq: Three times a day (TID) | ORAL | Status: DC | PRN
Start: 2021-04-01 — End: 2021-04-05

## 2021-04-01 MED ORDER — METHOCARBAMOL 500 MG PO TABS
500.0000 mg | ORAL_TABLET | Freq: Four times a day (QID) | ORAL | Status: DC | PRN
  Administered 2021-04-02 – 2021-04-05 (×2): 500 mg via ORAL
  Filled 2021-04-01 (×2): qty 1

## 2021-04-01 MED ORDER — MORPHINE SULFATE (PF) 2 MG/ML IV SOLN
0.5000 mg | INTRAVENOUS | Status: DC | PRN
  Administered 2021-04-02: 1 mg via INTRAVENOUS
  Filled 2021-04-01: qty 1

## 2021-04-01 MED ORDER — POLYETHYLENE GLYCOL 3350 17 G PO PACK: 17.0000 g | PACK | Freq: Every day | ORAL | Status: DC | PRN

## 2021-04-01 MED ORDER — SODIUM CHLORIDE FLUSH 0.9 % IV SOLN
INTRAVENOUS | Status: AC
  Filled 2021-04-01: qty 40

## 2021-04-01 MED ORDER — HYDROMORPHONE HCL 1 MG/ML IJ SOLN
0.2500 mg | INTRAMUSCULAR | Status: DC | PRN
  Administered 2021-04-01: 0.25 mg via INTRAVENOUS

## 2021-04-01 MED ORDER — VASOPRESSIN 20 UNIT/ML IV SOLN
INTRAVENOUS | Status: AC
  Filled 2021-04-01: qty 1

## 2021-04-01 MED ORDER — ONDANSETRON HCL 4 MG/2ML IJ SOLN
INTRAMUSCULAR | Status: AC
  Filled 2021-04-01: qty 2

## 2021-04-01 MED ORDER — ENOXAPARIN SODIUM 40 MG/0.4ML IJ SOSY
40.0000 mg | PREFILLED_SYRINGE | INTRAMUSCULAR | Status: DC
  Administered 2021-04-02: 40 mg via SUBCUTANEOUS
  Filled 2021-04-01: qty 0.4

## 2021-04-01 MED ORDER — LIDOCAINE HCL (PF) 2 % IJ SOLN
INTRAMUSCULAR | Status: AC
  Filled 2021-04-01: qty 5

## 2021-04-01 MED ORDER — NORTRIPTYLINE HCL 25 MG PO CAPS
25.0000 mg | ORAL_CAPSULE | Freq: Every day | ORAL | Status: DC
  Administered 2021-04-01 – 2021-04-04 (×4): 25 mg via ORAL
  Filled 2021-04-01 (×5): qty 1

## 2021-04-01 MED ORDER — EPHEDRINE SULFATE 50 MG/ML IJ SOLN
INTRAMUSCULAR | Status: DC | PRN
  Administered 2021-04-01: 7.5 mg via INTRAVENOUS
  Administered 2021-04-01 (×2): 5 mg via INTRAVENOUS

## 2021-04-01 MED ORDER — GLYCOPYRROLATE 0.2 MG/ML IJ SOLN
INTRAMUSCULAR | Status: AC
  Filled 2021-04-01: qty 1

## 2021-04-01 MED ORDER — ATENOLOL 25 MG PO TABS
25.0000 mg | ORAL_TABLET | Freq: Every day | ORAL | Status: DC
  Administered 2021-04-02: 25 mg via ORAL
  Filled 2021-04-01: qty 1

## 2021-04-01 MED ORDER — FERROUS SULFATE 325 (65 FE) MG PO TABS
325.0000 mg | ORAL_TABLET | Freq: Every day | ORAL | Status: DC
  Administered 2021-04-02 – 2021-04-05 (×4): 325 mg via ORAL
  Filled 2021-04-01 (×4): qty 1

## 2021-04-01 MED ORDER — METFORMIN HCL 500 MG PO TABS
1000.0000 mg | ORAL_TABLET | Freq: Two times a day (BID) | ORAL | Status: DC
  Administered 2021-04-01 – 2021-04-05 (×8): 1000 mg via ORAL
  Filled 2021-04-01 (×8): qty 2

## 2021-04-01 MED ORDER — DEXAMETHASONE SODIUM PHOSPHATE 10 MG/ML IJ SOLN
INTRAMUSCULAR | Status: AC
  Filled 2021-04-01: qty 1

## 2021-04-01 MED ORDER — METHOCARBAMOL 1000 MG/10ML IJ SOLN
500.0000 mg | Freq: Four times a day (QID) | INTRAVENOUS | Status: DC | PRN
  Administered 2021-04-01: 500 mg via INTRAVENOUS
  Filled 2021-04-01: qty 5

## 2021-04-01 MED ORDER — LEVOTHYROXINE SODIUM 137 MCG PO TABS
137.0000 ug | ORAL_TABLET | Freq: Every day | ORAL | Status: DC
  Administered 2021-04-02 – 2021-04-05 (×4): 137 ug via ORAL
  Filled 2021-04-01 (×4): qty 1

## 2021-04-01 MED ORDER — SUGAMMADEX SODIUM 200 MG/2ML IV SOLN
INTRAVENOUS | Status: DC | PRN
  Administered 2021-04-01: 200 mg via INTRAVENOUS

## 2021-04-01 MED ORDER — ESCITALOPRAM OXALATE 20 MG PO TABS
20.0000 mg | ORAL_TABLET | ORAL | Status: DC
  Administered 2021-04-02 – 2021-04-05 (×4): 20 mg via ORAL
  Filled 2021-04-01 (×4): qty 1

## 2021-04-01 MED ORDER — PROMETHAZINE HCL 25 MG/ML IJ SOLN
6.2500 mg | INTRAMUSCULAR | Status: DC | PRN
Start: 2021-04-01 — End: 2021-04-01

## 2021-04-01 MED ORDER — ACETAMINOPHEN 325 MG PO TABS
325.0000 mg | ORAL_TABLET | Freq: Four times a day (QID) | ORAL | Status: DC | PRN
  Administered 2021-04-02: 650 mg via ORAL
  Filled 2021-04-01: qty 2

## 2021-04-01 MED ORDER — NEOMYCIN-POLYMYXIN B GU 40-200000 IR SOLN
Status: AC
  Filled 2021-04-01: qty 4

## 2021-04-01 MED ORDER — GLYCOPYRROLATE 0.2 MG/ML IJ SOLN
INTRAMUSCULAR | Status: DC | PRN
  Administered 2021-04-01: .2 mg via INTRAVENOUS

## 2021-04-01 MED ORDER — INSULIN ASPART PROT & ASPART (70-30 MIX) 100 UNIT/ML ~~LOC~~ SUSP
10.0000 [IU] | Freq: Every day | SUBCUTANEOUS | Status: DC
  Administered 2021-04-02 – 2021-04-05 (×4): 10 [IU] via SUBCUTANEOUS
  Filled 2021-04-01: qty 10

## 2021-04-01 MED ORDER — ZOLPIDEM TARTRATE 5 MG PO TABS
5.0000 mg | ORAL_TABLET | Freq: Every evening | ORAL | Status: DC | PRN
  Administered 2021-04-02 – 2021-04-03 (×2): 5 mg via ORAL
  Filled 2021-04-01 (×2): qty 1

## 2021-04-01 MED ORDER — OXYCODONE HCL 5 MG/5ML PO SOLN: 5.0000 mg | Freq: Once | ORAL | Status: DC | PRN

## 2021-04-01 MED ORDER — ALUM & MAG HYDROXIDE-SIMETH 200-200-20 MG/5ML PO SUSP: 30.0000 mL | ORAL | Status: DC | PRN

## 2021-04-01 MED ORDER — DROPERIDOL 2.5 MG/ML IJ SOLN
0.6250 mg | Freq: Once | INTRAMUSCULAR | Status: DC | PRN
  Filled 2021-04-01: qty 2

## 2021-04-01 MED ORDER — LOSARTAN POTASSIUM 50 MG PO TABS: 100.0000 mg | ORAL_TABLET | ORAL | Status: DC

## 2021-04-01 MED ORDER — BUPIVACAINE-EPINEPHRINE (PF) 0.25% -1:200000 IJ SOLN
INTRAMUSCULAR | Status: AC
  Filled 2021-04-01: qty 30

## 2021-04-01 MED ORDER — PHENOL 1.4 % MT LIQD
1.0000 | OROMUCOSAL | Status: DC | PRN
  Filled 2021-04-01: qty 177

## 2021-04-01 MED ORDER — SUCCINYLCHOLINE CHLORIDE 200 MG/10ML IV SOSY
PREFILLED_SYRINGE | INTRAVENOUS | Status: DC | PRN
  Administered 2021-04-01: 100 mg via INTRAVENOUS

## 2021-04-01 MED ORDER — INSULIN ASPART 100 UNIT/ML IJ SOLN
0.0000 [IU] | Freq: Three times a day (TID) | INTRAMUSCULAR | Status: DC
  Administered 2021-04-01: 8 [IU] via SUBCUTANEOUS
  Administered 2021-04-02: 3 [IU] via SUBCUTANEOUS
  Administered 2021-04-02: 2 [IU] via SUBCUTANEOUS
  Administered 2021-04-02: 3 [IU] via SUBCUTANEOUS
  Administered 2021-04-03: 5 [IU] via SUBCUTANEOUS
  Administered 2021-04-03 – 2021-04-04 (×3): 3 [IU] via SUBCUTANEOUS
  Administered 2021-04-04 (×2): 2 [IU] via SUBCUTANEOUS
  Administered 2021-04-05: 3 [IU] via SUBCUTANEOUS
  Administered 2021-04-05: 2 [IU] via SUBCUTANEOUS
  Filled 2021-04-01 (×12): qty 1

## 2021-04-01 MED ORDER — OXYCODONE HCL 5 MG PO TABS
5.0000 mg | ORAL_TABLET | Freq: Once | ORAL | Status: DC | PRN
Start: 2021-04-01 — End: 2021-04-01

## 2021-04-01 MED ORDER — SODIUM CHLORIDE 0.9 % IV SOLN: INTRAVENOUS | Status: DC

## 2021-04-01 MED ORDER — METOCLOPRAMIDE HCL 5 MG/ML IJ SOLN: 5.0000 mg | Freq: Three times a day (TID) | INTRAMUSCULAR | Status: DC | PRN

## 2021-04-01 MED ORDER — FENTANYL CITRATE (PF) 100 MCG/2ML IJ SOLN
INTRAMUSCULAR | Status: AC
  Administered 2021-04-01: 25 ug via INTRAVENOUS
  Filled 2021-04-01: qty 2

## 2021-04-01 MED ORDER — FLEET ENEMA 7-19 GM/118ML RE ENEM
1.0000 | ENEMA | Freq: Once | RECTAL | Status: DC | PRN
Start: 2021-04-01 — End: 2021-04-05

## 2021-04-01 MED ORDER — DEXAMETHASONE SODIUM PHOSPHATE 10 MG/ML IJ SOLN
INTRAMUSCULAR | Status: DC | PRN
  Administered 2021-04-01: 5 mg via INTRAVENOUS

## 2021-04-01 MED ORDER — SODIUM CHLORIDE 0.9 % IR SOLN
Status: DC | PRN
  Administered 2021-04-01: 3004 mL

## 2021-04-01 MED ORDER — PROPOFOL 10 MG/ML IV BOLUS
INTRAVENOUS | Status: DC | PRN
  Administered 2021-04-01: 100 mg via INTRAVENOUS

## 2021-04-01 MED ORDER — FENTANYL CITRATE (PF) 100 MCG/2ML IJ SOLN
INTRAMUSCULAR | Status: AC
  Filled 2021-04-01: qty 2

## 2021-04-01 SURGICAL SUPPLY — 62 items
BLADE SAGITTAL AGGR TOOTH XLG (BLADE) ×2 IMPLANT
BNDG COHESIVE 6X5 TAN ST LF (GAUZE/BANDAGES/DRESSINGS) ×6 IMPLANT
CANISTER SUCT 1200ML W/VALVE (MISCELLANEOUS) ×2 IMPLANT
CANISTER WOUND CARE 500ML ATS (WOUND CARE) ×2 IMPLANT
CHLORAPREP W/TINT 26 (MISCELLANEOUS) ×2 IMPLANT
COVER BACK TABLE REUSABLE LG (DRAPES) ×2 IMPLANT
DRAPE 3/4 80X56 (DRAPES) ×6 IMPLANT
DRAPE C-ARM XRAY 36X54 (DRAPES) ×2 IMPLANT
DRAPE INCISE IOBAN 66X60 STRL (DRAPES) IMPLANT
DRAPE POUCH INSTRU U-SHP 10X18 (DRAPES) ×2 IMPLANT
DRESSING SURGICEL FIBRLLR 1X2 (HEMOSTASIS) ×2 IMPLANT
DRSG MEPILEX SACRM 8.7X9.8 (GAUZE/BANDAGES/DRESSINGS) ×2 IMPLANT
DRSG OPSITE POSTOP 4X8 (GAUZE/BANDAGES/DRESSINGS) ×4 IMPLANT
DRSG SURGICEL FIBRILLAR 1X2 (HEMOSTASIS) ×4
ELECT BLADE 6.5 EXT (BLADE) ×2 IMPLANT
ELECT REM PT RETURN 9FT ADLT (ELECTROSURGICAL) ×2
ELECTRODE REM PT RTRN 9FT ADLT (ELECTROSURGICAL) ×1 IMPLANT
GAUZE 4X4 16PLY ~~LOC~~+RFID DBL (SPONGE) ×2 IMPLANT
GLOVE SURG SYN 9.0  PF PI (GLOVE) ×2
GLOVE SURG SYN 9.0 PF PI (GLOVE) ×2 IMPLANT
GLOVE SURG UNDER POLY LF SZ9 (GLOVE) ×2 IMPLANT
GOWN SRG 2XL LVL 4 RGLN SLV (GOWNS) ×1 IMPLANT
GOWN STRL NON-REIN 2XL LVL4 (GOWNS) ×1
GOWN STRL REUS W/ TWL LRG LVL3 (GOWN DISPOSABLE) ×1 IMPLANT
GOWN STRL REUS W/TWL LRG LVL3 (GOWN DISPOSABLE) ×1
HEMOVAC 400CC 10FR (MISCELLANEOUS) IMPLANT
HIP FEM HD XL 28 (Head) ×2 IMPLANT
HOLDER FOLEY CATH W/STRAP (MISCELLANEOUS) ×2 IMPLANT
HOOD PEEL AWAY FLYTE STAYCOOL (MISCELLANEOUS) ×2 IMPLANT
IRRIGATION SURGIPHOR STRL (IV SOLUTION) IMPLANT
KIT PREVENA INCISION MGT 13 (CANNISTER) ×2 IMPLANT
LINER DBL MOB SZ 0 52MM (Liner) ×2 IMPLANT
MANIFOLD NEPTUNE II (INSTRUMENTS) ×2 IMPLANT
MASTERLOC HIP LATERAL S5 (Hips) ×2 IMPLANT
MAT ABSORB  FLUID 56X50 GRAY (MISCELLANEOUS) ×1
MAT ABSORB FLUID 56X50 GRAY (MISCELLANEOUS) ×1 IMPLANT
NDL SAFETY ECLIPSE 18X1.5 (NEEDLE) ×1 IMPLANT
NEEDLE HYPO 18GX1.5 SHARP (NEEDLE) ×1
NEEDLE SPNL 20GX3.5 QUINCKE YW (NEEDLE) ×4 IMPLANT
NS IRRIG 1000ML POUR BTL (IV SOLUTION) ×2 IMPLANT
PACK HIP COMPR (MISCELLANEOUS) ×2 IMPLANT
SCALPEL PROTECTED #10 DISP (BLADE) ×4 IMPLANT
SHELL ACETABULAR SZ 52 DM (Shell) ×2 IMPLANT
SOL PREP PVP 2OZ (MISCELLANEOUS) ×2
SOLUTION PREP PVP 2OZ (MISCELLANEOUS) ×1 IMPLANT
SPONGE DRAIN TRACH 4X4 STRL 2S (GAUZE/BANDAGES/DRESSINGS) ×2 IMPLANT
SPONGE T-LAP 18X18 ~~LOC~~+RFID (SPONGE) ×4 IMPLANT
STAPLER SKIN PROX 35W (STAPLE) ×2 IMPLANT
STRAP SAFETY 5IN WIDE (MISCELLANEOUS) ×2 IMPLANT
SUT DVC 2 QUILL PDO  T11 36X36 (SUTURE) ×1
SUT DVC 2 QUILL PDO T11 36X36 (SUTURE) ×1 IMPLANT
SUT SILK 0 (SUTURE) ×1
SUT SILK 0 30XBRD TIE 6 (SUTURE) ×1 IMPLANT
SUT V-LOC 90 ABS DVC 3-0 CL (SUTURE) ×2 IMPLANT
SUT VIC AB 1 CT1 36 (SUTURE) ×2 IMPLANT
SYR 20ML LL LF (SYRINGE) ×2 IMPLANT
SYR 30ML LL (SYRINGE) ×2 IMPLANT
SYR 50ML LL SCALE MARK (SYRINGE) ×4 IMPLANT
SYR BULB IRRIG 60ML STRL (SYRINGE) ×2 IMPLANT
TAPE MICROFOAM 4IN (TAPE) ×2 IMPLANT
TOWEL OR 17X26 4PK STRL BLUE (TOWEL DISPOSABLE) ×2 IMPLANT
TRAY FOLEY MTR SLVR 16FR STAT (SET/KITS/TRAYS/PACK) ×2 IMPLANT

## 2021-04-01 NOTE — Progress Notes (Signed)
Discussed purpose of Type/Screen -Patient verbalized consent to receive blood products when and if needed during surgery.  Obtained a written consent to be added to chart.

## 2021-04-01 NOTE — Plan of Care (Signed)
  Problem: Health Behavior/Discharge Planning: Goal: Ability to manage health-related needs will improve Outcome: Progressing   Problem: Clinical Measurements: Goal: Ability to maintain clinical measurements within normal limits will improve Outcome: Progressing   Problem: Activity: Goal: Risk for activity intolerance will decrease Outcome: Progressing   Problem: Nutrition: Goal: Adequate nutrition will be maintained Outcome: Progressing   Problem: Coping: Goal: Level of anxiety will decrease Outcome: Progressing   Problem: Elimination: Goal: Will not experience complications related to bowel motility Outcome: Progressing Goal: Will not experience complications related to urinary retention Outcome: Progressing   Problem: Pain Managment: Goal: General experience of comfort will improve Outcome: Progressing   Problem: Safety: Goal: Ability to remain free from injury will improve Outcome: Progressing   Problem: Skin Integrity: Goal: Risk for impaired skin integrity will decrease Outcome: Progressing   Problem: Activity: Goal: Ability to avoid complications of mobility impairment will improve Outcome: Progressing Goal: Ability to tolerate increased activity will improve Outcome: Progressing   Problem: Clinical Measurements: Goal: Postoperative complications will be avoided or minimized Outcome: Progressing   Problem: Skin Integrity: Goal: Will show signs of wound healing Outcome: Progressing   Problem: Education: Goal: Ability to describe self-care measures that may prevent or decrease complications (Diabetes Survival Skills Education) will improve Outcome: Progressing   Problem: Coping: Goal: Ability to adjust to condition or change in health will improve Outcome: Progressing   Problem: Health Behavior/Discharge Planning: Goal: Ability to manage health-related needs will improve Outcome: Progressing   Problem: Metabolic: Goal: Ability to maintain  appropriate glucose levels will improve Outcome: Progressing   Problem: Nutritional: Goal: Progress toward achieving an optimal weight will improve Outcome: Progressing   Problem: Skin Integrity: Goal: Risk for impaired skin integrity will decrease Outcome: Progressing   Problem: Tissue Perfusion: Goal: Adequacy of tissue perfusion will improve Outcome: Progressing

## 2021-04-01 NOTE — Transfer of Care (Signed)
Immediate Anesthesia Transfer of Care Note  Patient: Gina Tate  Procedure(s) Performed: TOTAL HIP ARTHROPLASTY ANTERIOR APPROACH (Left: Hip)  Patient Location: PACU  Anesthesia Type:General  Level of Consciousness: drowsy and patient cooperative  Airway & Oxygen Therapy: Patient Spontanous Breathing and Patient connected to face mask oxygen  Post-op Assessment: Report given to RN and Post -op Vital signs reviewed and stable  Post vital signs: Reviewed and stable  Last Vitals:  Vitals Value Taken Time  BP 123/60 04/01/21 1346  Temp 36.6 C 04/01/21 1344  Pulse 85 04/01/21 1349  Resp 11 04/01/21 1349  SpO2 99 % 04/01/21 1349  Vitals shown include unvalidated device data.  Last Pain:  Vitals:   04/01/21 1035  TempSrc: Temporal  PainSc: 0-No pain      Patients Stated Pain Goal: 0 (04/01/21 1035)  Complications: No notable events documented.

## 2021-04-01 NOTE — Op Note (Signed)
04/01/2021  1:39 PM  PATIENT:  Gina Tate  70 y.o. 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.052 Acute pain of left hip M25.552 Hip pain M25.559  POST-OPERATIVE DIAGNOSIS:  Avascular necrosis of left femur   PROCEDURE:  Procedure(s): TOTAL HIP ARTHROPLASTY ANTERIOR APPROACH (Left)  SURGEON: Leitha Schuller, MD  ASSISTANTS: None  ANESTHESIA:   general  EBL:  Total I/O In: 700 [I.V.:700] Out: 100 [Blood:100]  BLOOD ADMINISTERED:none  DRAINS:  Incisional wound VAC    LOCAL MEDICATIONS USED:  MARCAINE    and OTHER Exparel  SPECIMEN: Left femoral head  DISPOSITION OF SPECIMEN:  PATHOLOGY  COUNTS:  YES  TOURNIQUET:  * No tourniquets in log *  IMPLANTS: Medacta Master lock 5 lateralized with 52 mm Mpact DM cup and liner with XL metal 28 mm head  DICTATION: .Dragon Dictation   The patient was brought to the operating room and after general anesthesia was obtained patient was placed on the operative table with the ipsilateral foot into the Medacta attachment, contralateral leg on a well-padded table. C-arm was brought in and preop template x-ray taken. After prepping and draping in usual sterile fashion appropriate patient identification and timeout procedures were completed. Anterior approach to the hip was obtained and centered over the greater trochanter and TFL muscle. The subcutaneous tissue was incised hemostasis being achieved by electrocautery. TFL fascia was incised and the muscle retracted laterally deep retractor placed. The lateral femoral circumflex vessels were identified and ligated. The anterior capsule was exposed and a capsulotomy performed. The neck was identified and a femoral neck cut carried out with a saw. The head was removed without difficulty and showed sclerotic femoral head and acetabulum. Reaming was carried out to 52 mm and a 52 mm cup trial gave appropriate tightness to the acetabular component a 52 DM cup was impacted  into position. The leg was then externally rotated and ischiofemoral and pubofemoral releases carried out. The femur was sequentially broached to a size 5, size 5S then XL head trials were placed and the final components chosen. The 5 lateralized stem was inserted along with a XL metal 28 mm head and 52 mm liner. The hip was reduced and was stable the wound was thoroughly irrigated with fibrillar placed along the posterior capsule and medial neck. The deep fascia ws closed using a heavy Quill after infiltration of 30 cc of quarter percent Sensorcaine with epinephrine diluted with Exparel throughout the case .3-0 V-loc to close the skin with skin staples.  Incisional wound VAC applied and patient was sent to recovery in stable condition.   PLAN OF CARE: Admit to inpatient

## 2021-04-01 NOTE — H&P (Signed)
Chief Complaint Chief Complaint  Patient presents with   Left Hip - Follow-up  H&P for Left THA with Sugey Trevathan on 04/01/21   Reason for Visit Gina Tate is a 70 y.o. who presents today for a history and physical. She is scheduled to undergo a left total hip arthroplasty on 04/01/2021. Last seen in our clinic on 01/31/2021. There is been no change in her condition since that time.  The patient locates her pain to the left groin, entire left leg, and some of her back. She has a limp, and she notes it depends on how long she sits. When she lays down at night, it hurts for her to lay on her left side, so she normally sleeps on her stomach or on her right side. If she is sitting on the couch and puts her legs up, she can not put her right leg over on her left one because it is very painful.   The patient states she sees Dr. Marcelino Duster for her blood glucose. The patient's last A1c was 7.4. She states went on a diet and lost 38 pounds, and reduced her blood glucose to 6.9. She has gained 12 pounds back due to stress over family issues. She does not eat sweets.   The patient does not have a pacemaker  Past Medical History Past Medical History:  Diagnosis Date   Depression   DVT of leg (deep venous thrombosis) (CMS-HCC) 03/2017  Left   Elevated transaminase level  chronic, probably on basis of steatorrhea syndrome   Fracture of right patella  not operated   GERD (gastroesophageal reflux disease)   Headache, variant migraine   Hiatal hernia   History of cervical dysplasia  status post two normal Pap smears, 02/2002 and 05/2002, status post LEEP   Hyperlipidemia   Hypertension   Morton's neuroma, left   Obesity   Osteoarthritis   Ruptured cervical disc   Thyroid disease   TIA (transient ischemic attack)   Type 2 diabetes mellitus (CMS-HCC)   Past Surgical History Past Surgical History:  Procedure Laterality Date   ARTHROPLASTY TOTAL KNEE Left 04/22/2017   CATARACT EXTRACTION W/  INTRAOCULAR LENS IMPLANT AND ENDOCYCLOPHOTOCOAGULATION   CERVICAL BIOPSY W/ LOOP ELECTRODE EXCISION 12/07/2001   EGD 04/28/1993  Revealed moderate grade 2 distal esophagitis and small, intermittent hiatal hernia.   EGD 10/07/2015  GERD/No Repeat/PYO   EGD 01/28/2021   EGD 01/28/2021  Esophagitis/Repeat as needed/CTL   INCISION TENDON SHEATH FOR TRIGGER FINGER Right   INCISION TENDON SHEATH FOR TRIGGER FINGER Right 11/20/2019  Dr. Rosita Kea   Right long finger trigger release 04/29/2015  Surgeon: Dr. Rosita Kea   SPINE SURGERY   TONSILLECTOMY   TUBAL LIGATION   Past Family History Family History  Problem Relation Age of Onset   Myocardial Infarction (Heart attack) Father   Colon polyps Father   Alzheimer's disease Father   Anemia Mother   Alzheimer's disease Mother   Osteoporosis (Thinning of bones) Mother   Cervical cancer Sister  in her 56s, S/P hysterectomy   Dementia Sister   Cancer Brother   Diabetes Brother   Stomach cancer Maternal Grandmother  COD   No Known Problems Maternal Grandfather   No Known Problems Paternal Grandmother   No Known Problems Paternal Grandfather   Heart disease Brother   Diabetes Brother   Heart disease Sister   Heart disease Sister   Diabetes Sister   Heart disease Brother   Thyroid cancer Daughter   Melanoma Daughter  stage  III   No Known Problems Daughter   Medications Current Outpatient Medications Ordered in Epic  Medication Sig Dispense Refill   aspirin 81 MG EC tablet Take 81 mg by mouth once daily   atenoloL (TENORMIN) 25 MG tablet Take 2 tablets by mouth once daily 180 tablet 0   atorvastatin (LIPITOR) 10 MG tablet Take 1 tablet by mouth once daily 90 tablet 2   ferrous sulfate 325 (65 FE) MG tablet Take 325 mg by mouth daily with breakfast   levothyroxine (SYNTHROID) 137 MCG tablet Take 1 tablet (137 mcg total) by mouth once daily Take on an empty stomach with a glass of water at least 30-60 minutes before breakfast. 90 tablet 3    losartan (COZAAR) 100 MG tablet Take 1 tablet by mouth once daily 90 tablet 3   meloxicam (MOBIC) 15 MG tablet Take 1 tablet (15 mg total) by mouth once daily 30 tablet 0   metFORMIN (GLUCOPHAGE) 1000 MG tablet Take 1 tablet (1,000 mg total) by mouth 2 (two) times daily 180 tablet 3   nortriptyline (PAMELOR) 25 MG capsule Take 1 capsule (25 mg total) by mouth nightly 90 capsule 1   venlafaxine (EFFEXOR-XR) 75 MG XR capsule Take 1 capsule (75 mg total) by mouth once daily 90 capsule 3   escitalopram oxalate (LEXAPRO) 20 MG tablet Take 1 tablet (20 mg total) by mouth once daily for 90 days 90 tablet 1   No current Epic-ordered facility-administered medications on file.   Allergies No Known Allergies   Review of Systems A comprehensive 14 point ROS was performed, reviewed, and the pertinent orthopaedic findings are documented in the HPI.  Exam BP 124/86 (BP Location: Left upper arm, Patient Position: Sitting, BP Cuff Size: Adult)  Ht 165.1 cm (5\' 5" )  Wt 100.3 kg (221 lb 3.2 oz)  LMP (LMP Unknown)  BMI 36.81 kg/m   General: Well-developed well-nourished female seen in no acute distress.   HEENT: Atraumatic,normocephalic. Pupils are equal and reactive to light. Oropharynx is clear with moist mucosa  Lungs: Clear to auscultation bilaterally   Cardiovascular: Regular rate and rhythm. Normal S1, S2. No murmurs. No appreciable gallops or rubs. Peripheral pulses are palpable.  Abdomen: Soft, non-tender, nondistended. Bowel sounds present  Extremity: Left hip has 10 degrees internal rotation and 30 degrees external. Right hip has 40 degrees internal rotation and 60 degrees external. Antalgic gait.  Neurological:  The patient is alert and oriented Sensation to light touch appears to be intact and within normal limits Gross motor strength appeared to be equal to 5/5  Vascular :  Peripheral pulses felt to be palpable. Capillary refill appears to be intact and within normal  limits  X-ray  X-rays taken on 01/31/2021 in [facility] showed significant central hip osteoarthritis with some sclerosis of the femoral head. Appeared to possibly be related to avascular necrosis. Cyst formation was also noted to the femoral head.  Impression  1. 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 Patient has already discontinued any stopped any anticoagulation medication 2. Did discuss postop rehab 3. Patient return to clinic 6 weeks postop  This note was generated in part with voice recognition software and I apologize for any typographical errors that were not detected and corrected   [place] PA  Reviewed  H+P. No changes noted.

## 2021-04-01 NOTE — Anesthesia Procedure Notes (Signed)
Procedure Name: Intubation Date/Time: 04/01/2021 12:18 PM Performed by: Lorine Bears, RN Pre-anesthesia Checklist: Patient identified, Patient being monitored, Timeout performed, Emergency Drugs available and Suction available Patient Re-evaluated:Patient Re-evaluated prior to induction Oxygen Delivery Method: Circle system utilized Preoxygenation: Pre-oxygenation with 100% oxygen Induction Type: IV induction Ventilation: Mask ventilation without difficulty and Oral airway inserted - appropriate to patient size Laryngoscope Size: 3 and McGraph Grade View: Grade I Tube type: Oral Tube size: 7.0 mm Number of attempts: 1 Airway Equipment and Method: Stylet Placement Confirmation: ETT inserted through vocal cords under direct vision, positive ETCO2 and breath sounds checked- equal and bilateral Secured at: 21 cm Tube secured with: Tape Dental Injury: Teeth and Oropharynx as per pre-operative assessment

## 2021-04-01 NOTE — Anesthesia Preprocedure Evaluation (Addendum)
Anesthesia Evaluation  Patient identified by MRN, date of birth, ID band Patient awake    Reviewed: Allergy & Precautions, NPO status , Patient's Chart, lab work & pertinent test results, reviewed documented beta blocker date and time   Airway Mallampati: III  TM Distance: >3 FB Neck ROM: Full    Dental  (+) Missing, Poor Dentition,    Pulmonary shortness of breath and with exertion,  CT Chest 2022 July: IMPRESSION: 1. No acute cardiopulmonary disease. 2. Multiple bilateral pulmonary nodules similar in appearance to the prior examination of 08/21/2014. No further evaluation recommended   Pulmonary exam normal        Cardiovascular Exercise Tolerance: Poor METS: < 3 Mets hypertension, Pt. on home beta blockers and Pt. on medications + DOE   Rhythm:Regular Rate:Normal  MPS in 2017. States symptoms have been similar to then   Neuro/Psych  Headaches, PSYCHIATRIC DISORDERS Depression Dementia TIA (Remote)   GI/Hepatic Neg liver ROS, hiatal hernia (Small), GERD  Controlled and Medicated,dysphagia to liquid and solids. EGD 12/2020 with no findings   Endo/Other  diabetes, Poorly Controlled, Type 2, Oral Hypoglycemic Agents, Insulin DependentHypothyroidism   Renal/GU negative Renal ROS     Musculoskeletal  (+) Arthritis  (Avascular necrosis of left femur), Osteoarthritis,    Abdominal (+) + obese,   Peds  Hematology  (+) anemia ,   Anesthesia Other Findings   Reproductive/Obstetrics                            Anesthesia Physical Anesthesia Plan  ASA: 3  Anesthesia Plan: General   Post-op Pain Management:    Induction: Intravenous  PONV Risk Score and Plan: Ondansetron, Dexamethasone and Treatment may vary due to age or medical condition  Airway Management Planned: Oral ETT  Additional Equipment:   Intra-op Plan:   Post-operative Plan: Extubation in OR  Informed Consent: I have  reviewed the patients History and Physical, chart, labs and discussed the procedure including the risks, benefits and alternatives for the proposed anesthesia with the patient or authorized representative who has indicated his/her understanding and acceptance.     Dental advisory given  Plan Discussed with: CRNA and Anesthesiologist  Anesthesia Plan Comments:         Anesthesia Quick Evaluation

## 2021-04-01 NOTE — Evaluation (Signed)
Physical Therapy Evaluation Patient Details Name: Gina Tate MRN: [id number] DOB: 1951-01-09 Today's Date: 04/01/2021   History of Present Illness  Pt is a 70 yo F diagnosed with avascular necrosis of left femur and is s/p elective L THA.  PMH includes SOB, HTN, HA, depression, dementia, L TKA, hiatal hernia, and anemia.   Clinical Impression  Pt was pleasant and motivated to participate during the session. Pt performed well during functional tasks especially considering POD#0 status but was limited by orthostatic symptoms once in standing.  Pt's seated BP was 130/76 with HR 96 bpm and standing BP was 69/56 with HR 99 bpm.  Once back in supine pt's BP taken at 110/65 with HR 88 bpm and with all symptoms resolved, nursing notified.  Pt is expected to make good progress while in acute care and will have 24/7 supervision at discharge from her daughter who was a CNA as well as from her spouse.  Pt will benefit from HHPT upon discharge to safely address deficits listed in patient problem list for decreased caregiver assistance and eventual return to PLOF.      Follow Up Recommendations Home health PT;Supervision for mobility/OOB    Equipment Recommendations  None recommended by PT    Recommendations for Other Services       Precautions / Restrictions Precautions Precautions: Anterior Hip Precaution Booklet Issued: Yes (comment) Restrictions Weight Bearing Restrictions: Yes LLE Weight Bearing: Weight bearing as tolerated      Mobility  Bed Mobility Overal bed mobility: Modified Independent             General bed mobility comments: Extra time and effort only    Transfers Overall transfer level: Needs assistance Equipment used: Rolling walker (2 wheeled) Transfers: Sit to/from Stand Sit to Stand: Min assist         General transfer comment: Min to mod verbal cues for sequencing with min A to come to standing  Ambulation/Gait Ambulation/Gait assistance: Min  guard Gait Distance (Feet): 1 Feet Assistive device: Rolling walker (2 wheeled) Gait Pattern/deviations: Step-to pattern;Decreased stance time - left Gait velocity: decreased   General Gait Details: Pt able to take several small shuffling steps at the EOB, limited by lightheadedness; BP in sitting 130/76, standing 69/56 both with HR in the 90s; BP retaken in supine at 110/65 with HR 85 bpm  Stairs            Wheelchair Mobility    Modified Rankin (Stroke Patients Only)       Balance Overall balance assessment: Needs assistance   Sitting balance-Leahy Scale: Normal     Standing balance support: Bilateral upper extremity supported;During functional activity Standing balance-Leahy Scale: Good                               Pertinent Vitals/Pain Pain Assessment: 0-10 Pain Score: 2  Pain Location: L hip Pain Descriptors / Indicators: Sore Pain Intervention(s): Premedicated before session;Monitored during session    Home Living Family/patient expects to be discharged to:: Private residence Living Arrangements: Spouse/significant other;Children Available Help at Discharge: Family;Available 24 hours/day Type of Home: House Home Access: Stairs to enter Entrance Stairs-Rails: Right;Left;Can reach both Entrance Stairs-Number of Steps: 4 Home Layout: One level Home Equipment: Walker - 2 wheels;Cane - single point;Crutches;Bedside commode;Shower seat      Prior Function Level of Independence: Independent         Comments: Ind amb community distances without an AD,  Ind with ADLs, one fall in the last year secondary to LOB on a hill     Hand Dominance   Dominant Hand: Right    Extremity/Trunk Assessment   Upper Extremity Assessment Upper Extremity Assessment: Overall WFL for tasks assessed    Lower Extremity Assessment Lower Extremity Assessment: Generalized weakness;LLE deficits/detail LLE Deficits / Details: BLE ankle AROM, sensation to light  touch, and strength WNL LLE: Unable to fully assess due to pain LLE Sensation: WNL       Communication   Communication: No difficulties  Cognition Arousal/Alertness: Awake/alert Behavior During Therapy: WFL for tasks assessed/performed Overall Cognitive Status: Within Functional Limits for tasks assessed                                        General Comments      Exercises Total Joint Exercises Ankle Circles/Pumps: AROM;Strengthening;Both;10 reps Quad Sets: 10 reps;Both;Strengthening Gluteal Sets: Strengthening;Both;10 reps Heel Slides: AROM;Strengthening;Both;5 reps Long Arc Quad: AROM;Strengthening;Both;10 reps Knee Flexion: 10 reps;Both;Strengthening;AROM Other Exercises Other Exercises: Anterior hip precaution education provided (avoiding fencer position) Other Exercises: HEP education per handout   Assessment/Plan    PT Assessment Patient needs continued PT services  PT Problem List Decreased strength;Decreased activity tolerance;Decreased balance;Decreased mobility;Decreased knowledge of use of DME;Pain       PT Treatment Interventions DME instruction;Gait training;Stair training;Functional mobility training;Therapeutic activities;Therapeutic exercise;Balance training;Patient/family education    PT Goals (Current goals can be found in the Care Plan section)  Acute Rehab PT Goals Patient Stated Goal: To walk a mile without getting tired PT Goal Formulation: With patient Time For Goal Achievement: 04/14/21 Potential to Achieve Goals: Good    Frequency BID   Barriers to discharge        Co-evaluation               AM-PAC PT "6 Clicks" Mobility  Outcome Measure Help needed turning from your back to your side while in a flat bed without using bedrails?: A Little Help needed moving from lying on your back to sitting on the side of a flat bed without using bedrails?: A Little Help needed moving to and from a bed to a chair (including a  wheelchair)?: A Little Help needed standing up from a chair using your arms (e.g., wheelchair or bedside chair)?: A Little Help needed to walk in hospital room?: A Lot Help needed climbing 3-5 steps with a railing? : A Lot 6 Click Score: 16    End of Session Equipment Utilized During Treatment: Gait belt;Oxygen Activity Tolerance: Other (comment) (limited by orthostatic hypotension) Patient left: in bed;with call bell/phone within reach;with bed alarm set;with SCD's reapplied Nurse Communication: Mobility status;Other (comment) (Symptomatic orthostatic BPs) PT Visit Diagnosis: Muscle weakness (generalized) (M62.81);Other abnormalities of gait and mobility (R26.89);Pain Pain - Right/Left: Left Pain - part of body: Hip    Time: 5681-2751 PT Time Calculation (min) (ACUTE ONLY): 58 min   Charges:   PT Evaluation $PT Eval Moderate Complexity: 1 Mod PT Treatments $Therapeutic Exercise: 8-22 mins $Therapeutic Activity: 8-22 mins        D. Elly Modena PT, DPT 04/01/21, 5:26 PM

## 2021-04-02 ENCOUNTER — Encounter: Payer: Self-pay | Admitting: Orthopedic Surgery

## 2021-04-02 LAB — CBC
HCT: 30.5 % — ABNORMAL LOW (ref 36.0–46.0)
Hemoglobin: 10 g/dL — ABNORMAL LOW (ref 12.0–15.0)
MCH: 30 pg (ref 26.0–34.0)
MCHC: 32.8 g/dL (ref 30.0–36.0)
MCV: 91.6 fL (ref 80.0–100.0)
Platelets: 260 10*3/uL (ref 150–400)
RBC: 3.33 MIL/uL — ABNORMAL LOW (ref 3.87–5.11)
RDW: 13.2 % (ref 11.5–15.5)
WBC: 13.8 10*3/uL — ABNORMAL HIGH (ref 4.0–10.5)
nRBC: 0 % (ref 0.0–0.2)

## 2021-04-02 LAB — GLUCOSE, CAPILLARY
Glucose-Capillary: 181 mg/dL — ABNORMAL HIGH (ref 70–99)
Glucose-Capillary: 150 mg/dL — ABNORMAL HIGH (ref 70–99)
Glucose-Capillary: 156 mg/dL — ABNORMAL HIGH (ref 70–99)
Glucose-Capillary: 179 mg/dL — ABNORMAL HIGH (ref 70–99)

## 2021-04-02 LAB — BASIC METABOLIC PANEL
Anion gap: 6 (ref 5–15)
BUN: 14 mg/dL (ref 8–23)
CO2: 24 mmol/L (ref 22–32)
Calcium: 8.1 mg/dL — ABNORMAL LOW (ref 8.9–10.3)
Chloride: 103 mmol/L (ref 98–111)
Creatinine, Ser: 0.88 mg/dL (ref 0.44–1.00)
GFR, Estimated: >60 mL/min (ref >60)
Glucose, Bld: 236 mg/dL — ABNORMAL HIGH (ref 70–99)
Potassium: 4.6 mmol/L (ref 3.5–5.1)
Sodium: 133 mmol/L — ABNORMAL LOW (ref 135–145)

## 2021-04-02 MED ORDER — LOSARTAN POTASSIUM 50 MG PO TABS
100.0000 mg | ORAL_TABLET | ORAL | Status: DC
  Administered 2021-04-04: 100 mg via ORAL
  Filled 2021-04-02 (×2): qty 2

## 2021-04-02 MED ORDER — SODIUM CHLORIDE 0.9 % IV BOLUS
500.0000 mL | Freq: Once | INTRAVENOUS | Status: AC
  Administered 2021-04-02: 500 mL via INTRAVENOUS

## 2021-04-02 MED ORDER — ENOXAPARIN SODIUM 60 MG/0.6ML IJ SOSY
0.5000 mg/kg | PREFILLED_SYRINGE | INTRAMUSCULAR | Status: DC
Start: 2021-04-03 — End: 2021-04-05
  Administered 2021-04-03 – 2021-04-05 (×3): 50 mg via SUBCUTANEOUS
  Filled 2021-04-02 (×3): qty 0.6

## 2021-04-02 MED FILL — Bupivacaine Inj 0.25% w/ Epinephrine 1:200000: INTRAMUSCULAR | Qty: 30 | Status: AC

## 2021-04-02 NOTE — Plan of Care (Signed)
Patient alert and oriented x4, complains of pain and spasms to left lower extremity, relieved with prn pain medications and muscle relaxer's. NPWT remains clean, dry and intact to left hip at 125 mmHg, continuous with no drainage noted to canister. Cap refill >3 seconds to the extremity with adequate sensation, per patient. Good PO intake, consumed greater than water during shift, PIV stopped. Vitals stable, oxygen removed due to stable oxygen saturations. Encouraged to call for assistance with all transfers. Will continue to monitor.  Problem: Clinical Measurements: Goal: Ability to maintain clinical measurements within normal limits will improve Outcome: Progressing Goal: Will remain free from infection Outcome: Progressing   Problem: Activity: Goal: Risk for activity intolerance will decrease Outcome: Progressing   Problem: Nutrition: Goal: Adequate nutrition will be maintained Outcome: Progressing   Problem: Coping: Goal: Level of anxiety will decrease Outcome: Progressing   Problem: Elimination: Goal: Will not experience complications related to bowel motility Outcome: Progressing Goal: Will not experience complications related to urinary retention Outcome: Progressing   Problem: Pain Managment: Goal: General experience of comfort will improve Outcome: Progressing   Problem: Safety: Goal: Ability to remain free from injury will improve Outcome: Progressing   Problem: Skin Integrity: Goal: Risk for impaired skin integrity will decrease Outcome: Progressing   Problem: Skin Integrity: Goal: Will show signs of wound healing Outcome: Progressing

## 2021-04-02 NOTE — Anesthesia Postprocedure Evaluation (Signed)
Anesthesia Post Note  Patient: Gina Tate  Procedure(s) Performed: TOTAL HIP ARTHROPLASTY ANTERIOR APPROACH (Left: Hip)  Patient location during evaluation: PACU Anesthesia Type: General Level of consciousness: awake and alert Pain management: pain level controlled Vital Signs Assessment: post-procedure vital signs reviewed and stable Respiratory status: spontaneous breathing, nonlabored ventilation and respiratory function stable Cardiovascular status: blood pressure returned to baseline and stable Postop Assessment: no apparent nausea or vomiting Anesthetic complications: no   No notable events documented.   Last Vitals:  Vitals:   04/02/21 1123 04/02/21 1540  BP: 110/73 (!) 116/40  Pulse: 89 94  Resp: 18 16  Temp: 37.2 C 37.1 C  SpO2: 99% 98%    Last Pain:  Vitals:   04/02/21 0900  TempSrc:   PainSc: 0-No pain                 Foye Deer

## 2021-04-02 NOTE — Progress Notes (Signed)
Met with the patient at the bedside, she will be staying with her daughter, She has DME at home and does not need additional, She is set up with [facility] prior to surgery by the Surgeons office She has transportation and can afford her medications

## 2021-04-02 NOTE — Progress Notes (Signed)
PHARMACIST - PHYSICIAN COMMUNICATION  CONCERNING:  Enoxaparin (Lovenox) for DVT Prophylaxis    RECOMMENDATION: Patient was prescribed enoxaprin 40mg  q24 hours for VTE prophylaxis.   Filed Weights   04/01/21 1035  Weight: 100.5 kg (221 lb 9 oz)    Body mass index is 36.87 kg/m.  Estimated Creatinine Clearance: 68.9 mL/min (by C-G formula based on SCr of 0.88 mg/dL).   Based on [facility] policy patient is candidate for enoxaparin 0.5mg /kg TBW SQ every 24 hours based on BMI being >30.  DESCRIPTION: Pharmacy has adjusted enoxaparin dose per [facility] policy.  Patient is now receiving enoxaparin 50 mg every 24 hours    [facility], PharmD, BCPS Clinical Pharmacist 04/02/2021 2:48 PM

## 2021-04-02 NOTE — Progress Notes (Signed)
Physical Therapy Treatment Patient Details Name: Gina Tate MRN: [id number] DOB: Dec 07, 1950 Today's Date: 04/02/2021    History of Present Illness Pt is a 70 yo F diagnosed with avascular necrosis of left femur and is s/p elective L THA.  PMH includes SOB, HTN, HA, depression, dementia, L TKA, hiatal hernia, and anemia.    PT Comments    Pt was pleasant and motivated to participate during the session. Pt gave good effort throughout session. She still reported light headedness with standing activity. Pt's BP was assess seated at 119/47 and in standing at 80/47. Pt was able to walk within room with seated rest breaks but still limited by orthostatic symptoms. Further ambulation and OOB activities will be attempted next visit if appropriate. Pt will benefit from HHPT upon discharge to safely address deficits listed in patient problem list for decreased caregiver assistance and eventual return to PLOF.     Follow Up Recommendations  Home health PT;Supervision for mobility/OOB     Equipment Recommendations  None recommended by PT    Recommendations for Other Services       Precautions / Restrictions Precautions Precautions: Anterior Hip Precaution Booklet Issued: Yes (comment) Restrictions Weight Bearing Restrictions: Yes LLE Weight Bearing: Weight bearing as tolerated    Mobility  Bed Mobility Overal bed mobility: Needs Assistance Bed Mobility: Supine to Sit     Supine to sit: Min assist;HOB elevated     General bed mobility comments: Min A for LLE    Transfers Overall transfer level: Needs assistance Equipment used: Rolling walker (2 wheeled) Transfers: Sit to/from Stand Sit to Stand: Min assist         General transfer comment: Min A to initiate standing.  Ambulation/Gait Ambulation/Gait assistance: Min guard Gait Distance (Feet): 5 Feet x3 Assistive device: Rolling walker (2 wheeled) Gait Pattern/deviations: Step-to pattern;Decreased stance time -  left;Decreased step length - right;Decreased stride length Gait velocity: decreased   General Gait Details: Min verbal cues for sequencing. Limited by orthostatic symptoms.   Stairs             Wheelchair Mobility    Modified Rankin (Stroke Patients Only)       Balance Overall balance assessment: Needs assistance Sitting-balance support: Feet unsupported;No upper extremity supported Sitting balance-Leahy Scale: Normal     Standing balance support: Bilateral upper extremity supported;During functional activity Standing balance-Leahy Scale: Good Standing balance comment: Able to stand with single UE support through RW                            Cognition Arousal/Alertness: Awake/alert Behavior During Therapy: [facility] for tasks assessed/performed Overall Cognitive Status: Within Functional Limits for tasks assessed                                        Exercises Total Joint Exercises Long Arc Quad: AROM;Strengthening;Both;10 reps;Seated Marching in Standing: AROM;Strengthening;Both;10 reps;Standing Other Exercises Other Exercises: Static sitting 2-3 min x2 with supervision-min guard for improved balance and trunk control. Other Exercises: Static standing 1-68min x2 with min guard for improved activity tolerance.    General Comments        Pertinent Vitals/Pain Pain Assessment: 0-10 Pain Score: 10-Worst pain ever (1 at rest and 10/10 with activity) Pain Location: L hip/abdominal Pain Descriptors / Indicators: Sore;Aching Pain Intervention(s): Monitored during session;Repositioned;Limited activity within patient's tolerance  Home Living                      Prior Function            PT Goals (current goals can now be found in the care plan section) Progress towards PT goals: Progressing toward goals    Frequency    BID      PT Plan Current plan remains appropriate    Co-evaluation              AM-PAC PT  "6 Clicks" Mobility   Outcome Measure  Help needed turning from your back to your side while in a flat bed without using bedrails?: A Little Help needed moving from lying on your back to sitting on the side of a flat bed without using bedrails?: A Little Help needed moving to and from a bed to a chair (including a wheelchair)?: A Little Help needed standing up from a chair using your arms (e.g., wheelchair or bedside chair)?: A Little Help needed to walk in hospital room?: A Little Help needed climbing 3-5 steps with a railing? : A Lot 6 Click Score: 17    End of Session Equipment Utilized During Treatment: Gait belt Activity Tolerance: Other (comment) (limited by orthostatic hypotension) Patient left: in chair;with call bell/phone within reach;with SCD's reapplied Nurse Communication: Mobility status;Other (comment) (Symptomatic orthostatic Bps; Doesn't have a functional chair alarm, needs replacement) PT Visit Diagnosis: Muscle weakness (generalized) (M62.81);Other abnormalities of gait and mobility (R26.89);Pain Pain - Right/Left: Left Pain - part of body: Hip     Time: 8127-5170 PT Time Calculation (min) (ACUTE ONLY): 31 min  Charges:                        Desiree Hane SPT 04/02/21, 5:38 PM

## 2021-04-02 NOTE — Progress Notes (Signed)
Subjective: 1 Day Post-Op Procedure(s) (LRB): TOTAL HIP ARTHROPLASTY ANTERIOR APPROACH (Left) Patient reports pain as moderate.   Patient is well, and has had no acute complaints or problems Denies any CP, SOB, ABD pain. We will continue therapy today.  Plan is to go Home after hospital stay.  Objective: Vital signs in last 24 hours: Temp:  [97 F (36.1 C)-98.6 F (37 C)] 98.6 F (37 C) (08/03 0650) Pulse Rate:  [75-96] 88 (08/03 0650) Resp:  [11-18] 18 (08/02 2039) BP: (117-156)/(58-78) 117/64 (08/03 0650) SpO2:  [93 %-100 %] 100 % (08/02 2040) Weight:  [100.5 kg] 100.5 kg (08/02 1035)  Intake/Output from previous day: 08/02 0701 - 08/03 0700 In: 2011.7 [P.O.:50; I.V.:1711.7; IV Piggyback:250] Out: 100 [Blood:100] Intake/Output this shift: No intake/output data recorded.  Recent Labs    04/01/21 1535 04/02/21 0419  HGB 11.0* 10.0*   Recent Labs    04/01/21 1535 04/02/21 0419  WBC 16.5* 13.8*  RBC 3.62* 3.33*  HCT 33.5* 30.5*  PLT 269 260   Recent Labs    04/01/21 1535 04/02/21 0419  NA  --  133*  K  --  4.6  CL  --  103  CO2  --  24  BUN  --  14  CREATININE 0.80 0.88  GLUCOSE  --  236*  CALCIUM  --  8.1*   No results for input(s): LABPT, INR in the last 72 hours.  EXAM General - Patient is Alert, Appropriate, and Oriented Extremity - Sensation intact distally Intact pulses distally Dorsiflexion/Plantar flexion intact No cellulitis present Compartment soft Dressing - dressing C/D/I, no drainage, and Praveena intact without drainage Motor Function - intact, moving foot and toes well on exam.   Past Medical History:  Diagnosis Date   Anemia    Atypical chest pain    Bronchitis    chronic   Chronic cough    Dementia (HCC)    Depression    unspecified   Diabetes mellitus type 2, uncomplicated (HCC)    Diabetes mellitus, type II (HCC)    DVT (deep venous thrombosis) (HCC) 2018   after left total knee replacement   Dyspnea    since  having covid in January 2022-Sees Dr Karna Christmas   Dysrhythmia    tachycardia   Elevated transaminase level    chronic, probably on basis of steatorrhea syndrome   Environmental and seasonal allergies    Fracture of right patella    not operated   GERD (gastroesophageal reflux disease)    Headache    Headache, variant migraine    Hiatal hernia    History of cervical dysplasia    status post two normal pap smears, 02/2002 and 05/2002, status post LEEP   History of hiatal hernia    HOH (hard of hearing)    Hypercholesteremia    Hyperlipidemia, unspecified    Hypertension    Hypothyroidism    Morton's neuroma, left    Obesity, unspecified    Osteoarthritis    Pneumonia    Stroke (HCC) 2008   TIA   Thyroid disease    TIA (transient ischemic attack)    Wheezing     Assessment/Plan:   1 Day Post-Op Procedure(s) (LRB): TOTAL HIP ARTHROPLASTY ANTERIOR APPROACH (Left) Active Problems:   Status post total hip replacement, left  Estimated body mass index is 36.87 kg/m as calculated from the following:   Height as of this encounter: 5\' 5"  (1.651 m).   Weight as of this encounter: 100.5 kg.  Advance diet Up with therapy Work on bowel movement Acute postop blood loss anemia with hemoglobin of 10.0.  Start iron supplement. Recheck labs in the morning Vital signs stable, blood pressure soft.  Reports of orthostatic hypotension yesterday.  We will hold losartan today. Care management to assist with discharge.  Patient prefers to go home with home health PT.  Patient will be discharging home to sister's home.  DVT Prophylaxis - Lovenox, TED hose, and SCDs Weight-Bearing as tolerated to left leg   T. Cranston Neighbor, PA-C [facility] 04/02/2021, 7:46 AM

## 2021-04-02 NOTE — Progress Notes (Addendum)
Inpatient Diabetes Program Recommendations  AACE/ADA: New Consensus Statement on Inpatient Glycemic Control (2015)  Target Ranges:  Prepandial:   less than 140 mg/dL      Peak postprandial:   less than 180 mg/dL (1-2 hours)      Critically ill patients:  140 - 180 mg/dL   Results for SHIFRA, SWARTZENTRUBER (MRN [id number]) as of 04/02/2021 07:34  Ref. Range 04/01/2021 10:37 04/01/2021 13:48 04/01/2021 17:55 04/01/2021 21:43  Glucose-Capillary Latest Ref Range: 70 - 99 mg/dL 326 (H)  5 units NOVOLOG @1155   5 mg Decadron @1233  184 (H) 263 (H)  8 units NOVOLOG  275 (H)    Admit with: Avascular necrosis of left femur/ Total Hip  History: DM  Home DM Meds: 70/30 Insulin 10 units AM/ 25-40 units PM       Metformin 1000 mg BID  Current Orders: 70/30 Insulin 10 units AM      Novolog Moderate Correction Scale/ SSI (0-15 units) TID AC    MD- Note AM dose of 70/30 Insulin to start this AM  Patient also takes 70/30 Insulin at home in the Evening at Dinner time per her own sliding scale:  If sugar less than 100: none If sugar 100-150: 25 units If sugar 151-200: 30 units If sugar 201-250: 35 units If sugar over 251: 40 units  Please consider placing orders for 70/30 Insulin with Dinner (5pm) using pt's home sliding scale (see above)    --Will follow patient during hospitalization--  RN, MSN, CDE Diabetes Coordinator Inpatient Glycemic Control Team Team Pager: [phone] (8a-5p)

## 2021-04-02 NOTE — Progress Notes (Signed)
Physical Therapy Treatment Patient Details Name: Gina Tate MRN: [id number] DOB: 12/26/1950 Today's Date: 04/02/2021    History of Present Illness Pt is a 70 yo F diagnosed with avascular necrosis of left femur and is s/p elective L THA.  PMH includes SOB, HTN, HA, depression, dementia, L TKA, hiatal hernia, and anemia.    PT Comments    Pt was pleasant and motivated to participate during the session. Pt gave good effort during session. She reported light headedness and vision getting dark upon standing requiring her to sit down with symptoms quickly improving. BP assessed at 127/61 seated at EOB and 107/87 in standing. Pt able to walk short distances with seated rest breaks and was overall steady with min verbal cueing for sequencing. BP assessed in standing one more time after minimal standing activity at 80/54. Further OOB activities deferred secondary to orthostatic symptoms and will be attempted next session if appropriate. Pt will benefit from HHPT upon discharge to safely address deficits listed in patient problem list for decreased caregiver assistance and eventual return to PLOF.     Follow Up Recommendations  Home health PT;Supervision for mobility/OOB     Equipment Recommendations  None recommended by PT    Recommendations for Other Services       Precautions / Restrictions Precautions Precautions: Anterior Hip Precaution Booklet Issued: Yes (comment) Restrictions Weight Bearing Restrictions: Yes LLE Weight Bearing: Weight bearing as tolerated    Mobility  Bed Mobility               General bed mobility comments: NT, Pt sitting on EOB upon arrival    Transfers Overall transfer level: Needs assistance Equipment used: Rolling walker (2 wheeled) Transfers: Sit to/from Stand Sit to Stand: Min guard         General transfer comment: Min verbal cues for sequencing.  Ambulation/Gait Ambulation/Gait assistance: Min guard Gait Distance (Feet): 5 Feet  x2 Assistive device: Rolling walker (2 wheeled) Gait Pattern/deviations: Step-to pattern;Decreased stance time - left;Decreased step length - right;Decreased stride length Gait velocity: decreased   General Gait Details: Min verbal cues for sequencing. Limited by orthostatic symptoms.   Stairs             Wheelchair Mobility    Modified Rankin (Stroke Patients Only)       Balance Overall balance assessment: Needs assistance Sitting-balance support: Feet unsupported;No upper extremity supported Sitting balance-Leahy Scale: Normal     Standing balance support: Bilateral upper extremity supported;During functional activity Standing balance-Leahy Scale: Good Standing balance comment: Able to stand with single UE support through RW                            Cognition Arousal/Alertness: Awake/alert Behavior During Therapy: [facility] for tasks assessed/performed Overall Cognitive Status: Within Functional Limits for tasks assessed                                        Exercises Total Joint Exercises Long Arc Quad: AROM;Strengthening;Both;10 reps;Seated Marching in Standing: AROM;Strengthening;Both;10 reps;Seated;Standing (x10 seated; x10 sitting) Other Exercises Other Exercises: Static sitting 2-3 min x2 with supervision-min guard for improved balance and trunk control. Other Exercises: Static standing 1-28min x2 with min guard for improved activity tolerance.    General Comments        Pertinent Vitals/Pain Pain Assessment: 0-10 Pain Score: 10-Worst pain ever (  0 at rest and 10 with L LE movement) Pain Location: L hip Pain Descriptors / Indicators: Sore;Aching Pain Intervention(s): Monitored during session;Repositioned    Home Living                      Prior Function            PT Goals (current goals can now be found in the care plan section) Progress towards PT goals: Progressing toward goals    Frequency     BID      PT Plan Current plan remains appropriate    Co-evaluation              AM-PAC PT "6 Clicks" Mobility   Outcome Measure  Help needed turning from your back to your side while in a flat bed without using bedrails?: A Little Help needed moving from lying on your back to sitting on the side of a flat bed without using bedrails?: A Little Help needed moving to and from a bed to a chair (including a wheelchair)?: A Little Help needed standing up from a chair using your arms (e.g., wheelchair or bedside chair)?: A Little Help needed to walk in hospital room?: A Little Help needed climbing 3-5 steps with a railing? : A Lot 6 Click Score: 17    End of Session Equipment Utilized During Treatment: Gait belt Activity Tolerance: Other (comment) (limited by orthostatic hypotension) Patient left: in chair;with call bell/phone within reach;with SCD's reapplied Nurse Communication: Mobility status;Other (comment) (Symptomatic orthostatic BPs) PT Visit Diagnosis: Muscle weakness (generalized) (M62.81);Other abnormalities of gait and mobility (R26.89);Pain Pain - Right/Left: Left Pain - part of body: Hip     Time: 4270-6237 PT Time Calculation (min) (ACUTE ONLY): 39 min  Charges:                        Desiree Hane SPT 04/02/21, 1:32 PM

## 2021-04-03 LAB — CBC
HCT: 28.1 % — ABNORMAL LOW (ref 36.0–46.0)
Hemoglobin: 9.3 g/dL — ABNORMAL LOW (ref 12.0–15.0)
MCH: 30 pg (ref 26.0–34.0)
MCHC: 33.1 g/dL (ref 30.0–36.0)
MCV: 90.6 fL (ref 80.0–100.0)
Platelets: 239 10*3/uL (ref 150–400)
RBC: 3.1 MIL/uL — ABNORMAL LOW (ref 3.87–5.11)
RDW: 13.6 % (ref 11.5–15.5)
WBC: 11.1 10*3/uL — ABNORMAL HIGH (ref 4.0–10.5)
nRBC: 0 % (ref 0.0–0.2)

## 2021-04-03 LAB — GLUCOSE, CAPILLARY
Glucose-Capillary: 222 mg/dL — ABNORMAL HIGH (ref 70–99)
Glucose-Capillary: 171 mg/dL — ABNORMAL HIGH (ref 70–99)
Glucose-Capillary: 194 mg/dL — ABNORMAL HIGH (ref 70–99)
Glucose-Capillary: 192 mg/dL — ABNORMAL HIGH (ref 70–99)

## 2021-04-03 LAB — SURGICAL PATHOLOGY

## 2021-04-03 MED ORDER — DOCUSATE SODIUM 100 MG PO CAPS: 100.0000 mg | ORAL_CAPSULE | Freq: Two times a day (BID) | ORAL | 0 refills | Status: AC

## 2021-04-03 MED ORDER — ENOXAPARIN SODIUM 40 MG/0.4ML IJ SOSY: 40.0000 mg | PREFILLED_SYRINGE | INTRAMUSCULAR | 0 refills | Status: AC

## 2021-04-03 MED ORDER — HYDROCODONE-ACETAMINOPHEN 5-325 MG PO TABS: 1.0000 | ORAL_TABLET | ORAL | 0 refills | Status: DC | PRN

## 2021-04-03 MED ORDER — METHOCARBAMOL 500 MG PO TABS: 500.0000 mg | ORAL_TABLET | Freq: Four times a day (QID) | ORAL | 0 refills | Status: DC | PRN

## 2021-04-03 MED ORDER — TRAMADOL HCL 50 MG PO TABS: 50.0000 mg | ORAL_TABLET | Freq: Four times a day (QID) | ORAL | 0 refills | Status: DC | PRN

## 2021-04-03 NOTE — Discharge Summary (Addendum)
Physician Discharge Summary  Patient ID: Gina Tate MRN: [id number] DOB/AGE: 71-Aug-1952 71 y.o.  Admit date: 04/01/2021 Discharge date: 04/05/2021  Admission Diagnoses:  Status post total hip replacement, left [Z96.642]   Discharge Diagnoses: Patient Active Problem List   Diagnosis Date Noted   Status post total hip replacement, left 04/01/2021   DVT (deep venous thrombosis) (HCC) 05/14/2017   Protein-calorie malnutrition (HCC) 05/11/2017   Primary localized osteoarthritis of left knee 04/22/2017   Clinical depression 03/11/2015   Type 2 diabetes mellitus treated with insulin (HCC) 06/04/2014   Adult BMI 30+ 06/04/2014   Difficulty in walking 03/05/2014   Amnesia 03/05/2014   Adiposity 03/05/2014   Disordered sleep 03/05/2014   Acquired hypothyroidism 12/07/2013   Absolute anemia 12/07/2013   Benign essential HTN 12/07/2013   Arthritis, degenerative 12/07/2013   Pure hypercholesterolemia 12/07/2013   Type 2 diabetes mellitus (HCC) 12/07/2013   Diabetes mellitus (HCC) 12/07/2013    Past Medical History:  Diagnosis Date   Anemia    Atypical chest pain    Bronchitis    chronic   Chronic cough    Dementia (HCC)    Depression    unspecified   Diabetes mellitus type 2, uncomplicated (HCC)    Diabetes mellitus, type II (HCC)    DVT (deep venous thrombosis) (HCC) 2018   after left total knee replacement   Dyspnea    since having covid in January 2022-Sees Dr Karna Christmas   Dysrhythmia    tachycardia   Elevated transaminase level    chronic, probably on basis of steatorrhea syndrome   Environmental and seasonal allergies    Fracture of right patella    not operated   GERD (gastroesophageal reflux disease)    Headache    Headache, variant migraine    Hiatal hernia    History of cervical dysplasia    status post two normal pap smears, 02/2002 and 05/2002, status post LEEP   History of hiatal hernia    HOH (hard of hearing)    Hypercholesteremia    Hyperlipidemia,  unspecified    Hypertension    Hypothyroidism    Morton's neuroma, left    Obesity, unspecified    Osteoarthritis    Pneumonia    Stroke (HCC) 2008   TIA   Thyroid disease    TIA (transient ischemic attack)    Wheezing      Transfusion: none   Consultants (if any):   Discharged Condition: Improved  Hospital Course: Gina Tate is an 70 y.o. female who was admitted 04/01/2021 with a diagnosis of <principal problem not specified> and went to the operating room on 04/01/2021 and underwent the above named procedures.    Surgeries: Procedure(s): TOTAL HIP ARTHROPLASTY ANTERIOR APPROACH on 04/01/2021 Patient tolerated the surgery well. Taken to PACU where she was stabilized and then transferred to the orthopedic floor.  Started on Lovenox 40 mg q 24 hrs. Foot pumps applied bilaterally at 80 mm. Heels elevated on bed with rolled towels. No evidence of DVT. Negative Homan. Physical therapy started on day #1 for gait training and transfer. OT started day #1 for ADL and assisted devices.  Patient's foley was d/c on day #1. Patient's IV  was d/c on day #2.  On post op day #3 patient had orthostatic hypotension.  Blood pressure medications were held, she was given IV fluids and narcotics were cut back.  On postop day 4, patient made good progress of physical therapy, no orthostatic hypotension.  She is stable  and ready for discharge to home with home health PT.  Implants: Medacta Master lock 5 lateralized with 52 mm Mpact DM cup and liner with XL metal 28 mm head  She was given perioperative antibiotics:  Anti-infectives (From admission, onward)    Start     Dose/Rate Route Frequency Ordered Stop   04/01/21 1900  ceFAZolin (ANCEF) IVPB 2g/100 mL premix        2 g 200 mL/hr over 30 Minutes Intravenous Every 6 hours 04/01/21 1522 04/03/21 0223   04/01/21 1031  ceFAZolin (ANCEF) 2-4 GM/100ML-% IVPB       Note to Pharmacy: Rayann Heman   : cabinet override      04/01/21 1031 04/02/21  0512   04/01/21 0600  ceFAZolin (ANCEF) IVPB 2g/100 mL premix        2 g 200 mL/hr over 30 Minutes Intravenous On call to O.R. 03/31/21 2228 04/01/21 1225     .  She was given sequential compression devices, early ambulation, and Lovenox for DVT prophylaxis.  She benefited maximally from the hospital stay and there were no complications.    Recent vital signs:  Vitals:   04/05/21 0642 04/05/21 0808  BP: 121/66 128/63  Pulse: 94 (!) 104  Resp:  18  Temp:  98.6 F (37 C)  SpO2:  99%    Recent laboratory studies:  Lab Results  Component Value Date   HGB 10.1 (L) 04/05/2021   HGB 9.3 (L) 04/03/2021   HGB 10.0 (L) 04/02/2021   Lab Results  Component Value Date   WBC 8.4 04/05/2021   PLT 281 04/05/2021   Lab Results  Component Value Date   INR 1.05 04/15/2017   Lab Results  Component Value Date   NA 133 (L) 04/02/2021   K 4.6 04/02/2021   CL 103 04/02/2021   CO2 24 04/02/2021   BUN 14 04/02/2021   CREATININE 0.88 04/02/2021   GLUCOSE 236 (H) 04/02/2021    Discharge Medications:   Allergies as of 04/05/2021   No Known Allergies      Medication List     STOP taking these medications    aspirin EC 81 MG tablet   meloxicam 15 MG tablet Commonly known as: MOBIC       TAKE these medications    acetaminophen 500 MG tablet Commonly known as: TYLENOL Take 500 mg by mouth every 6 (six) hours as needed for moderate pain.   atenolol 25 MG tablet Commonly known as: TENORMIN Take 25 mg by mouth daily with supper.   atorvastatin 10 MG tablet Commonly known as: LIPITOR Take 10 mg by mouth daily at 6 PM.   docusate sodium 100 MG capsule Commonly known as: COLACE Take 1 capsule (100 mg total) by mouth 2 (two) times daily.   enoxaparin 40 MG/0.4ML injection Commonly known as: LOVENOX Inject 0.4 mLs (40 mg total) into the skin daily for 14 days.   escitalopram 20 MG tablet Commonly known as: LEXAPRO Take 20 mg by mouth every morning.   Euthyrox 137  MCG tablet Generic drug: levothyroxine Take 137 mcg by mouth daily before breakfast.   ferrous sulfate 325 (65 FE) MG tablet Take 325 mg by mouth daily with breakfast.   HYDROcodone-acetaminophen 5-325 MG tablet Commonly known as: NORCO/VICODIN Take 1-2 tablets by mouth every 4 (four) hours as needed for moderate pain (pain score 4-6).   insulin NPH-regular Human (70-30) 100 UNIT/ML injection Inject 10-40 Units into the skin See admin instructions. Inject 10  units in the morning and 25-40 units in the evening   lansoprazole 15 MG capsule Commonly known as: PREVACID Take 15 mg by mouth daily with supper.   losartan 100 MG tablet Commonly known as: COZAAR Take 100 mg by mouth every morning.   metFORMIN 1000 MG tablet Commonly known as: GLUCOPHAGE Take 1,000 mg by mouth 2 (two) times daily with a meal.   methocarbamol 500 MG tablet Commonly known as: ROBAXIN Take 1 tablet (500 mg total) by mouth every 6 (six) hours as needed for muscle spasms.   nortriptyline 25 MG capsule Commonly known as: PAMELOR Take 25 mg by mouth at bedtime.   traMADol 50 MG tablet Commonly known as: ULTRAM Take 1 tablet (50 mg total) by mouth every 6 (six) hours as needed.   venlafaxine XR 75 MG 24 hr capsule Commonly known as: EFFEXOR-XR Take 75 mg by mouth at bedtime.               Durable Medical Equipment  (From admission, onward)           Start     Ordered   04/01/21 1522  DME Bedside commode  Once       Question:  Patient needs a bedside commode to treat with the following condition  Answer:  Status post total hip replacement, left   04/01/21 1522   04/01/21 1522  DME 3 n 1  Once        04/01/21 1522   04/01/21 1522  DME Walker rolling  Once       Question Answer Comment  Walker: With 5 Inch Wheels   Patient needs a walker to treat with the following condition Status post total hip replacement, left      04/01/21 1522            Diagnostic Studies: CT CHEST WO  CONTRAST  Result Date: 03/24/2021 CLINICAL DATA:  History of COVID pneumonia earlier this year. Shortness of breath. EXAM: CT CHEST WITHOUT CONTRAST TECHNIQUE: Multidetector CT imaging of the chest was performed following the standard protocol without IV contrast. COMPARISON:  08/21/2014, 11/26/2020 FINDINGS: Cardiovascular: No significant vascular findings. Normal heart size. No pericardial effusion. Mild thoracic aortic atherosclerosis. Mediastinum/Nodes: No enlarged mediastinal or axillary lymph nodes. Thyroid gland, trachea, and esophagus demonstrate no significant findings. Lungs/Pleura: No focal consolidation, pleural effusion or pneumothorax. 4.6 mm left lower lobe pulmonary nodule unchanged compared with 08/21/2014. Adjacent 1-2 mm pulmonary nodule in the left lower lobe also unchanged compared with 08/21/2014. 3 mm right lower lobe pulmonary nodule unchanged compared with 08/21/2014. Stable 3 mm right middle lobe pulmonary nodule unchanged from 08/21/2014. Stable 3 mm right upper lobe pulmonary nodule unchanged compared with 08/21/2014. 2 mm right upper lobe pulmonary nodule (image 49/series 3) similar in appearance to the prior examination of 11/26/2020. Upper Abdomen: No acute upper abdominal abnormality. Musculoskeletal: No acute osseous abnormality. No aggressive osseous lesion. IMPRESSION: 1. No acute cardiopulmonary disease. 2. Multiple bilateral pulmonary nodules similar in appearance to the prior examination of 08/21/2014. No further evaluation recommended. Electronically Signed   By: Elige KoHetal  Patel   On: 03/24/2021 11:50   DG HIP OPERATIVE UNILAT W OR W/O PELVIS LEFT  Result Date: 04/01/2021 CLINICAL DATA:  Left anterior hip. EXAM: OPERATIVE LEFT HIP (WITH PELVIS IF PERFORMED) 2 VIEWS TECHNIQUE: Fluoroscopic spot image(s) were submitted for interpretation post-operatively. COMPARISON:  Left hip MRI 02/12/2021 FINDINGS: C-arm fluoroscopy provided in the operating room. 12 seconds of fluoroscopy  time. AP view following  arthroplasty demonstrates the hardware to be well positioned. No evidence of acute fracture or dislocation. The distal end of the femoral prosthesis is not imaged. IMPRESSION: Intraoperative views during left hip arthroplasty. Electronically Signed   By: Carey Bullocks M.D.   On: 04/01/2021 14:41   DG HIP UNILAT W OR W/O PELVIS 2-3 VIEWS LEFT  Result Date: 04/01/2021 CLINICAL DATA:  Postop pain left EXAM: DG HIP (WITH OR WITHOUT PELVIS) 2-3V LEFT COMPARISON:  Intraoperative left hip radiographs same date. Left hip MRI 02/12/2021. FINDINGS: Portable AP and cross-table lateral views of the left hip demonstrate interval left hip arthroplasty (per operative note, total hip arthroplasty). The hardware is intact. No evidence of acute fracture or dislocation. There is gas within the anterior soft tissues, and skin staples are in place. IMPRESSION: No demonstrated complication following left hip arthroplasty. Electronically Signed   By: Carey Bullocks M.D.   On: 04/01/2021 14:39    Disposition: Discharge disposition: 06-Home-Health Care Svc         Follow-up Information     Evon Slack, PA-C Follow up in 2 week(s).   Specialties: Orthopedic Surgery, Emergency Medicine Why: aug 17th 915am Contact information: [address] [place] [address] [phone]                  Signed: Patience Musca 04/05/2021, 9:09 AM

## 2021-04-03 NOTE — Progress Notes (Signed)
Subjective: 2 Days Post-Op Procedure(s) (LRB): TOTAL HIP ARTHROPLASTY ANTERIOR APPROACH (Left) Patient reports pain as moderate.   Patient with orthostatic hypotension.  Denies any CP, SOB, ABD pain. We will continue therapy today.  Plan is to go Home after hospital stay.  Objective: Vital signs in last 24 hours: Temp:  [98.3 F (36.8 C)-98.9 F (37.2 C)] 98.3 F (36.8 C) (08/04 0757) Pulse Rate:  [89-107] 107 (08/04 0904) Resp:  [15-18] 15 (08/04 0757) BP: (107-119)/(40-73) 114/40 (08/04 0904) SpO2:  [97 %-99 %] 98 % (08/04 0904)  Intake/Output from previous day: 08/03 0701 - 08/04 0700 In: 430.6 [P.O.:240; I.V.:190.6] Out: 0  Intake/Output this shift: No intake/output data recorded.  Recent Labs    04/01/21 1535 04/02/21 0419 04/03/21 0403  HGB 11.0* 10.0* 9.3*   Recent Labs    04/02/21 0419 04/03/21 0403  WBC 13.8* 11.1*  RBC 3.33* 3.10*  HCT 30.5* 28.1*  PLT 260 239   Recent Labs    04/01/21 1535 04/02/21 0419  NA  --  133*  K  --  4.6  CL  --  103  CO2  --  24  BUN  --  14  CREATININE 0.80 0.88  GLUCOSE  --  236*  CALCIUM  --  8.1*   No results for input(s): LABPT, INR in the last 72 hours.  EXAM General - Patient is Alert, Appropriate, and Oriented Extremity - Sensation intact distally Intact pulses distally Dorsiflexion/Plantar flexion intact No cellulitis present Compartment soft Dressing - dressing C/D/I, no drainage, and Praveena intact without drainage Motor Function - intact, moving foot and toes well on exam.   Past Medical History:  Diagnosis Date   Anemia    Atypical chest pain    Bronchitis    chronic   Chronic cough    Dementia (HCC)    Depression    unspecified   Diabetes mellitus type 2, uncomplicated (HCC)    Diabetes mellitus, type II (HCC)    DVT (deep venous thrombosis) (HCC) 2018   after left total knee replacement   Dyspnea    since having covid in January 2022-Sees Dr Karna Christmas   Dysrhythmia     tachycardia   Elevated transaminase level    chronic, probably on basis of steatorrhea syndrome   Environmental and seasonal allergies    Fracture of right patella    not operated   GERD (gastroesophageal reflux disease)    Headache    Headache, variant migraine    Hiatal hernia    History of cervical dysplasia    status post two normal pap smears, 02/2002 and 05/2002, status post LEEP   History of hiatal hernia    HOH (hard of hearing)    Hypercholesteremia    Hyperlipidemia, unspecified    Hypertension    Hypothyroidism    Morton's neuroma, left    Obesity, unspecified    Osteoarthritis    Pneumonia    Stroke (HCC) 2008   TIA   Thyroid disease    TIA (transient ischemic attack)    Wheezing     Assessment/Plan:   2 Days Post-Op Procedure(s) (LRB): TOTAL HIP ARTHROPLASTY ANTERIOR APPROACH (Left) Active Problems:   Status post total hip replacement, left  Estimated body mass index is 36.87 kg/m as calculated from the following:   Height as of this encounter: 5\' 5"  (1.651 m).   Weight as of this encounter: 100.5 kg. Advance diet Up with therapy Work on bowel movement Acute postop blood loss  anemia with hemoglobin of 9.3.  Continue with iron supplement. Recheck labs in the morning Vital signs stable,Orthostatic with PT. Continue to hold losartan and atenolol. Care management to assist with discharge.  Patient prefers to go home with home health PT.  Patient will be discharging home to sister's home.  DVT Prophylaxis - Lovenox, TED hose, and SCDs Weight-Bearing as tolerated to left leg   T. Cranston Neighbor, PA-C [facility] 04/03/2021, 10:25 AM

## 2021-04-03 NOTE — Progress Notes (Signed)
Physical Therapy Treatment Patient Details Name: Gina Tate MRN: [id number] DOB: Apr 06, 1951 Today's Date: 04/03/2021    History of Present Illness 70 yo F diagnosed with avascular necrosis of left femur and is s/p elective L THA.  PMH includes SOB, HTN, HA, depression, dementia, L TKA, hiatal hernia, and anemia.    PT Comments    Pt continues to improve with PT and though she is still having some BP issues and did have one episode of dizziness after negotiating steps, but otherwise was relatively symptoms free.  She continues to need some light assist with L LE during mobility in/out of bed, but is showing improved mobility and was able to circumambulate the nurses' station with slow, deliberate but safe gait, showing increased cadence and good tolerance during L WBing.    Follow Up Recommendations  Home health PT;Supervision for mobility/OOB     Equipment Recommendations  None recommended by PT    Recommendations for Other Services       Precautions / Restrictions Precautions Precautions: Anterior Hip Restrictions Weight Bearing Restrictions: Yes LLE Weight Bearing: Weight bearing as tolerated    Mobility  Bed Mobility Overal bed mobility: Needs Assistance Bed Mobility: Supine to Sit;Sit to Supine     Supine to sit: Min assist Sit to supine: Min assist   General bed mobility comments: Pt able to do most of the transition to/from supine/sit with heavy rail use.  Could not lift L LE fully up into bed but with R LE hooking under she did do a portion of the lifting    Transfers Overall transfer level: Needs assistance Equipment used: Rolling walker (2 wheeled) Transfers: Sit to/from Stand Sit to Stand: Min guard         General transfer comment: able to rise to  Ambulation/Gait Ambulation/Gait assistance: Min guard Gait Distance (Feet): 200 Feet Assistive device: Rolling walker (2 wheeled)       General Gait Details: Pt was able to progress ambulation very  well this afternoon. She did have some low grade dizziness and relatively low BP but was able to fully circumambulate the nurses' station and showed improved cadence and ability to maintain consistent walker motion and nearly symmetrical cadence for much of the effort.   Stairs Stairs: Yes Stairs assistance: Min guard Stair Management: Two rails Number of Stairs: 4 General stair comments: Pt was slow, deliberate, but safe with stair negotiation.  Reports steps at daughter's home are much shorter than those she did today, feeling confident about being able to manage them.   Wheelchair Mobility    Modified Rankin (Stroke Patients Only)       Balance Overall balance assessment: Needs assistance Sitting-balance support: Feet unsupported;No upper extremity supported Sitting balance-Leahy Scale: Normal     Standing balance support: Bilateral upper extremity supported;During functional activity Standing balance-Leahy Scale: Good                              Cognition Arousal/Alertness: Awake/alert Behavior During Therapy: WFL for tasks assessed/performed Overall Cognitive Status: Within Functional Limits for tasks assessed                                        Exercises Total Joint Exercises Quad Sets: Strengthening;10 reps Short Arc Quad: AAROM;10 reps Heel Slides: AROM;10 reps;AAROM Hip ABduction/ADduction: 10 reps;AROM;AAROM    General Comments  General comments (skin integrity, edema, etc.): similar to this AM pt has some drop in BP with orthostatic position changes, but did not have much lightheaded/dizzy/etc symptoms. Supine on arrival: 103/53, sitting 95/53, standing 83/61, in sitting post steps (and some lighthedeness) 102/61      Pertinent Vitals/Pain Pain Assessment: 0-10 Pain Score:  (pt reports 0/10 at rest, reports "10/10" with most activity that was not congruent with her responses and facial indication) Pain Location: L hip     Home Living                      Prior Function            PT Goals (current goals can now be found in the care plan section) Progress towards PT goals: Progressing toward goals    Frequency    BID      PT Plan Current plan remains appropriate    Co-evaluation              AM-PAC PT "6 Clicks" Mobility   Outcome Measure  Help needed turning from your back to your side while in a flat bed without using bedrails?: A Little Help needed moving from lying on your back to sitting on the side of a flat bed without using bedrails?: A Little Help needed moving to and from a bed to a chair (including a wheelchair)?: A Little Help needed standing up from a chair using your arms (e.g., wheelchair or bedside chair)?: A Little Help needed to walk in hospital room?: A Little Help needed climbing 3-5 steps with a railing? : A Lot 6 Click Score: 17    End of Session Equipment Utilized During Treatment: Gait belt Activity Tolerance: Patient tolerated treatment well Patient left: in chair;with call bell/phone within reach;with SCD's reapplied   PT Visit Diagnosis: Muscle weakness (generalized) (M62.81);Other abnormalities of gait and mobility (R26.89);Pain Pain - Right/Left: Left Pain - part of body: Hip     Time: 1400-1510 PT Time Calculation (min) (ACUTE ONLY): 70 min  Charges:  $Gait Training: 23-37 mins $Therapeutic Exercise: 23-37 mins $Therapeutic Activity: 8-22 mins                     Malachi Pro, DPT 04/03/2021, 3:52 PM

## 2021-04-03 NOTE — Discharge Instructions (Signed)

## 2021-04-03 NOTE — Plan of Care (Signed)
Patient alert and oriented x 4, complains of pain to surgical extremity relieved by prn pain medications. NPWT therapy dressing intact with no drainage during shift, continuous suction at . Vitals stable during shift, remains on room air via nasal canula. Stable condition at end of shift, will continue to monitor.  Problem: Health Behavior/Discharge Planning: Goal: Ability to manage health-related needs will improve Outcome: Progressing   Problem: Clinical Measurements: Goal: Ability to maintain clinical measurements within normal limits will improve Outcome: Progressing Goal: Will remain free from infection Outcome: Progressing   Problem: Activity: Goal: Risk for activity intolerance will decrease Outcome: Progressing   Problem: Coping: Goal: Level of anxiety will decrease Outcome: Progressing   Problem: Pain Managment: Goal: General experience of comfort will improve Outcome: Progressing   Problem: Skin Integrity: Goal: Risk for impaired skin integrity will decrease Outcome: Progressing

## 2021-04-03 NOTE — Progress Notes (Signed)
Physical Therapy Treatment Patient Details Name: Gina Tate MRN: [id number] DOB: 05-Dec-1950 Today's Date: 04/03/2021    History of Present Illness 70 yo F diagnosed with avascular necrosis of left femur and is s/p elective L THA.  PMH includes SOB, HTN, HA, depression, dementia, L TKA, hiatal hernia, and anemia.    PT Comments    Pt feeling better today, eager to try and do some ambulation and overall had bet best functional session yet.  She did not have orthostatic symptoms that has been a big issue thus far.  Supine: 112/62, sitting 110/57, standing 101/50 all w/o subjective symptoms.  Pt was able to ambulate 65 ft with gradually improving cadence and heavy cuing to keep going as she did not feel confident with prolonged ambulation.  Follow Up Recommendations  Home health PT;Supervision for mobility/OOB     Equipment Recommendations  None recommended by PT    Recommendations for Other Services       Precautions / Restrictions Precautions Precautions: Anterior Hip Restrictions Weight Bearing Restrictions: Yes LLE Weight Bearing: Weight bearing as tolerated    Mobility  Bed Mobility Overal bed mobility: Needs Assistance Bed Mobility: Supine to Sit     Supine to sit: Min assist;Min guard     General bed mobility comments: Pt was able to rise to sitting EOB w/o physical assist, heavy UE use    Transfers Overall transfer level: Needs assistance Equipment used: Rolling walker (2 wheeled) Transfers: Sit to/from Stand Sit to Stand: Min guard         General transfer comment: again heavy UE reliance, but able to rise w/o assist  Ambulation/Gait Ambulation/Gait assistance: Min guard Gait Distance (Feet): 65 Feet Assistive device: Rolling walker (2 wheeled)       General Gait Details: Pt had, by far, her best bout of ambulation yet.  No realy orthostatic symptoms (and much more consistent objective BP readings).  No LOBs or overt safety issues, she initially  was rather slow and deliberate but did increase cadence and confidence during the effort.   Stairs             Wheelchair Mobility    Modified Rankin (Stroke Patients Only)       Balance Overall balance assessment: Needs assistance Sitting-balance support: Feet unsupported;No upper extremity supported Sitting balance-Leahy Scale: Normal     Standing balance support: Bilateral upper extremity supported;During functional activity Standing balance-Leahy Scale: Good                              Cognition Arousal/Alertness: Awake/alert Behavior During Therapy: WFL for tasks assessed/performed Overall Cognitive Status: Within Functional Limits for tasks assessed                                        Exercises Total Joint Exercises Quad Sets: Strengthening;10 reps Short Arc Quad: AAROM;10 reps Heel Slides: AROM;10 reps;AAROM (lightly resisted extensions) Hip ABduction/ADduction: 10 reps;AROM;AAROM    General Comments General comments (skin integrity, edema, etc.): orhtostatic BPs were stable t/o the session, only minimal lightheadedness with nothing even close to the severity of need to quickly sit during prolonged standing acts      Pertinent Vitals/Pain Pain Assessment: 0-10 Pain Score: 6  Pain Location: L hip    Home Living  Prior Function            PT Goals (current goals can now be found in the care plan section) Progress towards PT goals: Progressing toward goals    Frequency    BID      PT Plan Current plan remains appropriate    Co-evaluation              AM-PAC PT "6 Clicks" Mobility   Outcome Measure  Help needed turning from your back to your side while in a flat bed without using bedrails?: A Little Help needed moving from lying on your back to sitting on the side of a flat bed without using bedrails?: A Little Help needed moving to and from a bed to a chair (including a  wheelchair)?: A Little Help needed standing up from a chair using your arms (e.g., wheelchair or bedside chair)?: A Little Help needed to walk in hospital room?: A Little Help needed climbing 3-5 steps with a railing? : A Lot 6 Click Score: 17    End of Session Equipment Utilized During Treatment: Gait belt Activity Tolerance: Patient tolerated treatment well Patient left: in chair;with call bell/phone within reach;with SCD's reapplied   PT Visit Diagnosis: Muscle weakness (generalized) (M62.81);Other abnormalities of gait and mobility (R26.89);Pain Pain - Right/Left: Left Pain - part of body: Hip     Time: 0912-1000 PT Time Calculation (min) (ACUTE ONLY): 48 min  Charges:  $Gait Training: 8-22 mins $Therapeutic Exercise: 8-22 mins $Therapeutic Activity: 8-22 mins                     Malachi Pro, DPT 04/03/2021, 12:58 PM

## 2021-04-04 LAB — GLUCOSE, CAPILLARY
Glucose-Capillary: 174 mg/dL — ABNORMAL HIGH (ref 70–99)
Glucose-Capillary: 139 mg/dL — ABNORMAL HIGH (ref 70–99)
Glucose-Capillary: 144 mg/dL — ABNORMAL HIGH (ref 70–99)
Glucose-Capillary: 138 mg/dL — ABNORMAL HIGH (ref 70–99)

## 2021-04-04 MED ORDER — SODIUM CHLORIDE 0.9 % IV BOLUS
1000.0000 mL | Freq: Once | INTRAVENOUS | Status: AC
  Administered 2021-04-04: 1000 mL via INTRAVENOUS

## 2021-04-04 NOTE — Progress Notes (Signed)
Physical Therapy Treatment Patient Details Name: Gina Tate MRN: [id number] DOB: Apr 11, 1951 Today's Date: 04/04/2021    History of Present Illness 70 yo F diagnosed with avascular necrosis of left femur and is s/p elective L THA.  PMH includes SOB, HTN, HA, depression, dementia, L TKA, hiatal hernia, and anemia.    PT Comments    Pt was pleasant and motivated to participate during the session but ambulation was limited by symptomatic hypotension with vitals as follow: supine 115/58 96 bpm, sitting 127/67 98 bpm, standing 0 min 92/58 114 bpm mildly symptomatic, and standing after minimal ambulation 91/55 120 bpm moderately symptomatic primarily with light headedness.  Pt put forth good effort throughout the session and functionally was able to demonstrate good control and stability with tasks. Pt required only minimal physical assist during bed mobility tasks  and no physical assistance with transfers and gait. Once BP stabilizes pt will be appropriate for discharge home with HHPT services to safely address deficits listed in patient problem list for decreased caregiver assistance and eventual return to PLOF.      Follow Up Recommendations  Home health PT;Supervision for mobility/OOB     Equipment Recommendations  None recommended by PT    Recommendations for Other Services       Precautions / Restrictions Precautions Precautions: Anterior Hip Precaution Booklet Issued: Yes (comment) Restrictions Weight Bearing Restrictions: Yes LLE Weight Bearing: Weight bearing as tolerated    Mobility  Bed Mobility Overal bed mobility: Needs Assistance       Supine to sit: Min assist Sit to supine: Supervision   General bed mobility comments: Min A during sup to sit for trunk management but no physical assist needed during sit to sup    Transfers Overall transfer level: Needs assistance Equipment used: Rolling walker (2 wheeled) Transfers: Sit to/from Stand Sit to Stand: Min  guard         General transfer comment: Good control and stability with no cues for sequencing needed  Ambulation/Gait Ambulation/Gait assistance: Min guard Gait Distance (Feet): 10 Feet x 1, 5 Feet x 1 Assistive device: Rolling walker (2 wheeled) Gait Pattern/deviations: Step-to pattern;Decreased stance time - left;Decreased step length - right Gait velocity: decreased   General Gait Details: Pt steady with amb without LOB with distance limited by orthostatics per above   Stairs             Wheelchair Mobility    Modified Rankin (Stroke Patients Only)       Balance Overall balance assessment: Needs assistance Sitting-balance support: Feet unsupported;No upper extremity supported Sitting balance-Leahy Scale: Normal     Standing balance support: Bilateral upper extremity supported;During functional activity Standing balance-Leahy Scale: Good                              Cognition Arousal/Alertness: Awake/alert Behavior During Therapy: WFL for tasks assessed/performed Overall Cognitive Status: Within Functional Limits for tasks assessed                                        Exercises Total Joint Exercises Ankle Circles/Pumps: AROM;Strengthening;Both;10 reps Quad Sets: Strengthening;10 reps;Both Gluteal Sets: Strengthening;Both;10 reps Towel Squeeze: Strengthening;Both;10 reps Hip ABduction/ADduction: 10 reps;AROM;AAROM;Both Long Arc Quad: AROM;Strengthening;Both;10 reps Knee Flexion: 10 reps;Both;Strengthening;AROM Marching in Standing: AROM;Strengthening;Both;10 reps;Standing Other Exercises Other Exercises: Static sitting at EOB x 8 min for  improved activity tolerance and BP management Other Exercises: Static standing at EOB 2 x 3-5 min for improved activity tolerance and BP management    General Comments        Pertinent Vitals/Pain Pain Assessment: 0-10 Pain Score: 5  Pain Location: L hip Pain Descriptors /  Indicators: Sore;Aching Pain Intervention(s): Premedicated before session;Monitored during session;Repositioned    Home Living                      Prior Function            PT Goals (current goals can now be found in the care plan section) Progress towards PT goals: PT to reassess next treatment    Frequency    BID      PT Plan Current plan remains appropriate    Co-evaluation              AM-PAC PT "6 Clicks" Mobility   Outcome Measure  Help needed turning from your back to your side while in a flat bed without using bedrails?: A Little Help needed moving from lying on your back to sitting on the side of a flat bed without using bedrails?: A Little Help needed moving to and from a bed to a chair (including a wheelchair)?: A Little Help needed standing up from a chair using your arms (e.g., wheelchair or bedside chair)?: A Little Help needed to walk in hospital room?: A Little Help needed climbing 3-5 steps with a railing? : A Lot 6 Click Score: 17    End of Session Equipment Utilized During Treatment: Gait belt Activity Tolerance: Other (comment) (limited by orthostatic hypotension) Patient left: with call bell/phone within reach;with SCD's reapplied;in bed;with bed alarm set Nurse Communication: Mobility status;Other (comment) (orthostatic BPs) PT Visit Diagnosis: Muscle weakness (generalized) (M62.81);Other abnormalities of gait and mobility (R26.89);Pain Pain - Right/Left: Left Pain - part of body: Hip     Time: 1010-1103 PT Time Calculation (min) (ACUTE ONLY): 53 min  Charges:  $Gait Training: 8-22 mins $Therapeutic Exercise: 8-22 mins $Therapeutic Activity: 23-37 mins                     D. Scott Jakyrah Holladay PT, DPT 04/04/21, 11:33 AM

## 2021-04-04 NOTE — Progress Notes (Signed)
Subjective: 3 Days Post-Op Procedure(s) (LRB): TOTAL HIP ARTHROPLASTY ANTERIOR APPROACH (Left) Patient reports pain as mild.   Patient with orthostatic hypotension.  Denies any CP, SOB, ABD pain. We will continue therapy today.  Plan is to go Home after hospital stay.  Objective: Vital signs in last 24 hours: Temp:  [98 F (36.7 C)-99.3 F (37.4 C)] 99.1 F (37.3 C) (08/05 0755) Pulse Rate:  [98-107] 98 (08/05 0755) Resp:  [15-17] 16 (08/05 0755) BP: (102-139)/(40-72) 119/72 (08/05 0755) SpO2:  [98 %-100 %] 100 % (08/05 0755)  Intake/Output from previous day: No intake/output data recorded. Intake/Output this shift: No intake/output data recorded.  Recent Labs    04/01/21 1535 04/02/21 0419 04/03/21 0403  HGB 11.0* 10.0* 9.3*   Recent Labs    04/02/21 0419 04/03/21 0403  WBC 13.8* 11.1*  RBC 3.33* 3.10*  HCT 30.5* 28.1*  PLT 260 239   Recent Labs    04/01/21 1535 04/02/21 0419  NA  --  133*  K  --  4.6  CL  --  103  CO2  --  24  BUN  --  14  CREATININE 0.80 0.88  GLUCOSE  --  236*  CALCIUM  --  8.1*   No results for input(s): LABPT, INR in the last 72 hours.  EXAM General - Patient is Alert, Appropriate, and Oriented Extremity - Sensation intact distally Intact pulses distally Dorsiflexion/Plantar flexion intact No cellulitis present Compartment soft Dressing - dressing C/D/I, no drainage, and Praveena intact without drainage Motor Function - intact, moving foot and toes well on exam.   Past Medical History:  Diagnosis Date   Anemia    Atypical chest pain    Bronchitis    chronic   Chronic cough    Dementia (HCC)    Depression    unspecified   Diabetes mellitus type 2, uncomplicated (HCC)    Diabetes mellitus, type II (HCC)    DVT (deep venous thrombosis) (HCC) 2018   after left total knee replacement   Dyspnea    since having covid in January 2022-Sees Dr Karna Christmas   Dysrhythmia    tachycardia   Elevated transaminase level     chronic, probably on basis of steatorrhea syndrome   Environmental and seasonal allergies    Fracture of right patella    not operated   GERD (gastroesophageal reflux disease)    Headache    Headache, variant migraine    Hiatal hernia    History of cervical dysplasia    status post two normal pap smears, 02/2002 and 05/2002, status post LEEP   History of hiatal hernia    HOH (hard of hearing)    Hypercholesteremia    Hyperlipidemia, unspecified    Hypertension    Hypothyroidism    Morton's neuroma, left    Obesity, unspecified    Osteoarthritis    Pneumonia    Stroke (HCC) 2008   TIA   Thyroid disease    TIA (transient ischemic attack)    Wheezing     Assessment/Plan:   3 Days Post-Op Procedure(s) (LRB): TOTAL HIP ARTHROPLASTY ANTERIOR APPROACH (Left) Active Problems:   Status post total hip replacement, left  Estimated body mass index is 36.87 kg/m as calculated from the following:   Height as of this encounter: 5\' 5"  (1.651 m).   Weight as of this encounter: 100.5 kg. Advance diet Up with therapy Acute postop blood loss anemia with hemoglobin of 9.3.  Continue with iron supplement. Vital signs  stable Care management to assist with discharge.  Patient prefers to go home with home health PT.  Patient will be discharging home to sister's home.  DVT Prophylaxis - Lovenox, TED hose, and SCDs Weight-Bearing as tolerated to left leg   T. Cranston Neighbor, PA-C [facility] 04/04/2021, 8:00 AM

## 2021-04-04 NOTE — Progress Notes (Signed)
Physical Therapy Treatment Patient Details Name: Gina Tate MRN: [id number] DOB: [birth year] Today's Date: 04/04/2021    History of Present Illness 70 yo F diagnosed with avascular necrosis of left femur and is s/p elective L THA.  PMH includes SOB, HTN, HA, depression, dementia, L TKA, hiatal hernia, and anemia.    PT Comments    Pt was pleasant and motivated to participate during the session. Pt gave good effort throughout session. She reported intensity of light headedness with standing activity from "medium- a lot". Pt's BP and HR was assessed in supine at 114/52, 94bpm; in sitting at 127/72,105bpm; and in standing at 86/51, 112bpm . Pt was able to walk very limited distance within room limited by orthostatic symptoms. Further ambulation and OOB activities will be attempted next visit if appropriate. Pt will benefit from HHPT upon discharge to safely address deficits listed in patient problem list for decreased caregiver assistance and eventual return to PLOF.   Follow Up Recommendations  Home health PT;Supervision for mobility/OOB     Equipment Recommendations  None recommended by PT    Recommendations for Other Services       Precautions / Restrictions Precautions Precautions: Anterior Hip Precaution Booklet Issued: Yes (comment) Restrictions Weight Bearing Restrictions: Yes LLE Weight Bearing: Weight bearing as tolerated    Mobility  Bed Mobility Overal bed mobility: Needs Assistance Bed Mobility: Supine to Sit;Sit to Supine     Supine to sit: Supervision Sit to supine: Supervision   General bed mobility comments: No physical assist needed    Transfers Overall transfer level: Needs assistance Equipment used: Rolling walker (2 wheeled) Transfers: Sit to/from Stand Sit to Stand: Min guard         General transfer comment: Good concentric/eccentric control for elevated surface.  Ambulation/Gait   Gait Distance (Feet): 3 Feet x2 Assistive device: Rolling  walker (2 wheeled) Gait Pattern/deviations: Step-to pattern;Decreased stance time - left;Decreased step length - right Gait velocity: decreased   General Gait Details: Pt steady with amb without LOB; Distance limited by orthostatics per above   Stairs             Wheelchair Mobility    Modified Rankin (Stroke Patients Only)       Balance Overall balance assessment: Needs assistance Sitting-balance support: Feet unsupported;No upper extremity supported Sitting balance-Leahy Scale: Normal     Standing balance support: Bilateral upper extremity supported;During functional activity Standing balance-Leahy Scale: Good Standing balance comment: Able to stand with single UE support through RW                            Cognition Arousal/Alertness: Awake/alert Behavior During Therapy: WFL for tasks assessed/performed Overall Cognitive Status: Within Functional Limits for tasks assessed                                        Exercises Total Joint Exercises Ankle Circles/Pumps: AROM;Strengthening;Both;10 reps Quad Sets: Strengthening;Left;10 reps;Supine Heel Slides: AROM;Strengthening;Left;10 reps;Supine Straight Leg Raises: AROM;Right;AAROM;Left;10 reps;Supine;Strengthening Long Arc Quad: AROM;Strengthening;Both;10 reps;Seated Other Exercises Other Exercises: Static sitting at EOB 3-4 min for improved trunk control and balance Other Exercises: Static standing at EOB 2-3 min for improved activity tolerance and BP management    General Comments        Pertinent Vitals/Pain Pain Assessment: 0-10 Pain Score: 7  (with activity) Pain Location: L hip Pain  Descriptors / Indicators: Sore;Aching Pain Intervention(s): Monitored during session    Home Living                      Prior Function            PT Goals (current goals can now be found in the care plan section) Progress towards PT goals: Progressing toward goals     Frequency    BID      PT Plan Current plan remains appropriate    Co-evaluation              AM-PAC PT "6 Clicks" Mobility   Outcome Measure  Help needed turning from your back to your side while in a flat bed without using bedrails?: None Help needed moving from lying on your back to sitting on the side of a flat bed without using bedrails?: A Little Help needed moving to and from a bed to a chair (including a wheelchair)?: A Little Help needed standing up from a chair using your arms (e.g., wheelchair or bedside chair)?: A Little Help needed to walk in hospital room?: A Little Help needed climbing 3-5 steps with a railing? : A Lot 6 Click Score: 18    End of Session Equipment Utilized During Treatment: Gait belt Activity Tolerance: Other (comment) (limited by orthostatic hypotension) Patient left: in bed;with call bell/phone within reach;with bed alarm set;with SCD's reapplied Nurse Communication: Mobility status;Other (comment) (orthostatic BPs) PT Visit Diagnosis: Muscle weakness (generalized) (M62.81);Other abnormalities of gait and mobility (R26.89);Pain Pain - Right/Left: Left Pain - part of body: Hip     Time: 7622-6333 PT Time Calculation (min) (ACUTE ONLY): 39 min  Charges:                        Desiree Hane SPT 04/04/21, 5:06 PM

## 2021-04-04 NOTE — Progress Notes (Signed)
Pt continues with orthostatic hypotension after 1,000 cc bolus. Supine 114/52 94 bpm, sitting 127/72 105 bpm, standing 86/51 112 bpm with significant lightheadedness. MD notified; d/c pending at this time

## 2021-04-04 NOTE — Care Management Important Message (Signed)
Important Message  Patient Details  Name: Gina Tate MRN: [id number] Date of Birth: 1951/08/12   Medicare Important Message Given:  N/A - LOS <3 / Initial given by admissions     Olegario Messier A [name] 04/04/2021, 8:22 AM

## 2021-04-04 NOTE — Plan of Care (Signed)
  Problem: Health Behavior/Discharge Planning: Goal: Ability to manage health-related needs will improve Outcome: Progressing   Problem: Clinical Measurements: Goal: Ability to maintain clinical measurements within normal limits will improve Outcome: Progressing Goal: Will remain free from infection Outcome: Progressing   Problem: Activity: Goal: Risk for activity intolerance will decrease Outcome: Progressing   Problem: Nutrition: Goal: Adequate nutrition will be maintained Outcome: Progressing   Problem: Coping: Goal: Level of anxiety will decrease Outcome: Progressing   Problem: Elimination: Goal: Will not experience complications related to bowel motility Outcome: Progressing Goal: Will not experience complications related to urinary retention Outcome: Progressing   Problem: Pain Managment: Goal: General experience of comfort will improve Outcome: Progressing   Problem: Safety: Goal: Ability to remain free from injury will improve Outcome: Progressing   Problem: Skin Integrity: Goal: Risk for impaired skin integrity will decrease Outcome: Progressing   Problem: Activity: Goal: Ability to avoid complications of mobility impairment will improve Outcome: Progressing Goal: Ability to tolerate increased activity will improve Outcome: Progressing   Problem: Clinical Measurements: Goal: Postoperative complications will be avoided or minimized Outcome: Progressing   Problem: Skin Integrity: Goal: Will show signs of wound healing Outcome: Progressing   Problem: Education: Goal: Ability to describe self-care measures that may prevent or decrease complications (Diabetes Survival Skills Education) will improve Outcome: Progressing   Problem: Coping: Goal: Ability to adjust to condition or change in health will improve Outcome: Progressing   Problem: Metabolic: Goal: Ability to maintain appropriate glucose levels will improve Outcome: Progressing

## 2021-04-05 LAB — CBC
HCT: 29.9 % — ABNORMAL LOW (ref 36.0–46.0)
Hemoglobin: 10.1 g/dL — ABNORMAL LOW (ref 12.0–15.0)
MCH: 30.5 pg (ref 26.0–34.0)
MCHC: 33.8 g/dL (ref 30.0–36.0)
MCV: 90.3 fL (ref 80.0–100.0)
Platelets: 281 10*3/uL (ref 150–400)
RBC: 3.31 MIL/uL — ABNORMAL LOW (ref 3.87–5.11)
RDW: 13.3 % (ref 11.5–15.5)
WBC: 8.4 10*3/uL (ref 4.0–10.5)
nRBC: 0 % (ref 0.0–0.2)

## 2021-04-05 LAB — GLUCOSE, CAPILLARY
Glucose-Capillary: 169 mg/dL — ABNORMAL HIGH (ref 70–99)
Glucose-Capillary: 147 mg/dL — ABNORMAL HIGH (ref 70–99)

## 2021-04-05 NOTE — Progress Notes (Signed)
Physical Therapy Treatment Patient Details Name: Gina Tate MRN: [id number] DOB: [birth year] Today's Date: 04/05/2021    History of Present Illness 70 yo F diagnosed with avascular necrosis of left femur and is s/p elective L THA.  PMH includes SOB, HTN, HA, depression, dementia, L TKA, hiatal hernia, and anemia.    PT Comments    Pt was long sitting in bed upon arriving. Agrees to PT session and is cooperative throughout. BP checked throughout session without signs of orthostatic hypotension however pt reports symptoms. PA is aware. Pt will need continued skilled PT at DC to progress strength, mobility, and safety with ADLs. Author will return for 2nd session prior to pt Dcing home.    Follow Up Recommendations  Home health PT;Supervision for mobility/OOB     Equipment Recommendations  None recommended by PT       Precautions / Restrictions Precautions Precautions: Anterior Hip Precaution Booklet Issued: Yes (comment) Restrictions Weight Bearing Restrictions: Yes LLE Weight Bearing: Weight bearing as tolerated    Mobility  Bed Mobility Overal bed mobility: Needs Assistance Bed Mobility: Supine to Sit;Sit to Supine     Supine to sit: Supervision Sit to supine: Min assist   General bed mobility comments: did require min assist to progress LE back into bed form EOB short sit    Transfers Overall transfer level: Needs assistance Equipment used: Rolling walker (2 wheeled) Transfers: Sit to/from Stand Sit to Stand: Supervision         General transfer comment: Pt demonstrated safe ability to STS EOB without physical assistance  Ambulation/Gait Ambulation/Gait assistance: Min guard Gait Distance (Feet): 8 Feet Assistive device: Rolling walker (2 wheeled) Gait Pattern/deviations: Step-to pattern;Decreased stance time - left;Decreased step length - right Gait velocity: decreased   General Gait Details: Distance limited by pt reporting dizziness. PA aware       Balance Overall balance assessment: Needs assistance Sitting-balance support: Feet unsupported;No upper extremity supported Sitting balance-Leahy Scale: Normal     Standing balance support: Bilateral upper extremity supported;During functional activity Standing balance-Leahy Scale: Good Standing balance comment: Able to stand with single UE support through RW        Cognition Arousal/Alertness: Awake/alert Behavior During Therapy: WFL for tasks assessed/performed Overall Cognitive Status: Within Functional Limits for tasks assessed        General Comments: A and O x 4      Exercises Total Joint Exercises Ankle Circles/Pumps: AROM;Strengthening;Both;10 reps Quad Sets: Strengthening;Left;10 reps;Supine Gluteal Sets: Strengthening;Both;10 reps Towel Squeeze: Strengthening;Both;10 reps Heel Slides: AROM;Strengthening;Left;10 reps;Supine Hip ABduction/ADduction: 10 reps;AROM;AAROM;Both Straight Leg Raises: AROM;Right;AAROM;Left;10 reps;Supine;Strengthening        Pertinent Vitals/Pain Pain Assessment: 0-10 Pain Score: 6  Pain Location: L hip Pain Descriptors / Indicators: Sore;Aching Pain Intervention(s): Limited activity within patient's tolerance;Monitored during session;Premedicated before session;Repositioned     PT Goals (current goals can now be found in the care plan section) Acute Rehab PT Goals Patient Stated Goal: none stated Progress towards PT goals: Progressing toward goals    Frequency    BID      PT Plan Current plan remains appropriate       AM-PAC PT "6 Clicks" Mobility   Outcome Measure  Help needed turning from your back to your side while in a flat bed without using bedrails?: None Help needed moving from lying on your back to sitting on the side of a flat bed without using bedrails?: A Little Help needed moving to and from a bed to a chair (including  a wheelchair)?: A Little Help needed standing up from a chair using your arms (e.g.,  wheelchair or bedside chair)?: A Little Help needed to walk in hospital room?: A Little Help needed climbing 3-5 steps with a railing? : A Little 6 Click Score: 19    End of Session Equipment Utilized During Treatment: Gait belt Activity Tolerance: Other (comment) Patient left: in bed;with call bell/phone within reach;with bed alarm set;with SCD's reapplied Nurse Communication: Mobility status PT Visit Diagnosis: Muscle weakness (generalized) (M62.81);Other abnormalities of gait and mobility (R26.89);Pain Pain - Right/Left: Left Pain - part of body: Hip     Time: 0835-0900 PT Time Calculation (min) (ACUTE ONLY): 25 min  Charges:  $Therapeutic Exercise: 8-22 mins $Therapeutic Activity: 8-22 mins                    Jetta Lout PTA 04/05/21, 1:16 PM

## 2021-04-05 NOTE — TOC Transition Note (Signed)
Transition of Care [facility]) - CM/SW Discharge Note   Patient Details  Name: Gina Tate MRN: [id number] Date of Birth: July 08, 1951  [facility] Care [facility] CM/SW Contact:  Liliana Cline, LCSW Phone Number: 04/05/2021, 11:08 AM   Clinical Narrative:   Patient has orders to discharge home today. Notified [place] with [facility].    Final next level of care: Home w Home Health Services Barriers to Discharge: Barriers Resolved   Patient Goals and CMS Choice Patient states their goals for this hospitalization and ongoing recovery are:: go home      Discharge Placement                       Discharge Plan and Services   Discharge Planning Services: CM Consult            DME Arranged: N/A         HH Arranged: PT HH Agency: [facility] Date HH Agency Contacted: 04/05/21 Time HH Agency Contacted: [id number] Representative spoke with at [facility] Agency: [place]  Social Determinants of Health (SDOH) Interventions     Readmission Risk Interventions No flowsheet data found.

## 2021-04-05 NOTE — Progress Notes (Signed)
Subjective: 4 Days Post-Op Procedure(s) (LRB): TOTAL HIP ARTHROPLASTY ANTERIOR APPROACH (Left) Patient reports pain as mild.   Patient with orthostatic hypotension yesterday Denies any CP, SOB, ABD pain. We will continue therapy today.  Plan is to go Home after hospital stay.  Objective: Vital signs in last 24 hours: Temp:  [98.7 F (37.1 C)-99.1 F (37.3 C)] 98.7 F (37.1 C) (08/06 0300) Pulse Rate:  [94-102] 94 (08/06 0642) Resp:  [16-19] 19 (08/06 0300) BP: (115-133)/(41-72) 121/66 (08/06 0642) SpO2:  [96 %-100 %] 96 % (08/06 0300)  Intake/Output from previous day: 08/05 0701 - 08/06 0700 In: 480 [P.O.:480] Out: -  Intake/Output this shift: No intake/output data recorded.  Recent Labs    04/03/21 0403  HGB 9.3*   Recent Labs    04/03/21 0403  WBC 11.1*  RBC 3.10*  HCT 28.1*  PLT 239   No results for input(s): NA, K, CL, CO2, BUN, CREATININE, GLUCOSE, CALCIUM in the last 72 hours.  No results for input(s): LABPT, INR in the last 72 hours.  EXAM General - Patient is Alert, Appropriate, and Oriented Extremity - Sensation intact distally Intact pulses distally Dorsiflexion/Plantar flexion intact No cellulitis present Compartment soft Dressing - dressing C/D/I, no drainage, and Praveena intact without drainage Motor Function - intact, moving foot and toes well on exam.   Past Medical History:  Diagnosis Date   Anemia    Atypical chest pain    Bronchitis    chronic   Chronic cough    Dementia (HCC)    Depression    unspecified   Diabetes mellitus type 2, uncomplicated (HCC)    Diabetes mellitus, type II (HCC)    DVT (deep venous thrombosis) (HCC) 2018   after left total knee replacement   Dyspnea    since having covid in January 2022-Sees Dr Karna Christmas   Dysrhythmia    tachycardia   Elevated transaminase level    chronic, probably on basis of steatorrhea syndrome   Environmental and seasonal allergies    Fracture of right patella    not  operated   GERD (gastroesophageal reflux disease)    Headache    Headache, variant migraine    Hiatal hernia    History of cervical dysplasia    status post two normal pap smears, 02/2002 and 05/2002, status post LEEP   History of hiatal hernia    HOH (hard of hearing)    Hypercholesteremia    Hyperlipidemia, unspecified    Hypertension    Hypothyroidism    Morton's neuroma, left    Obesity, unspecified    Osteoarthritis    Pneumonia    Stroke (HCC) 2008   TIA   Thyroid disease    TIA (transient ischemic attack)    Wheezing     Assessment/Plan:   4 Days Post-Op Procedure(s) (LRB): TOTAL HIP ARTHROPLASTY ANTERIOR APPROACH (Left) Active Problems:   Status post total hip replacement, left  Estimated body mass index is 36.87 kg/m as calculated from the following:   Height as of this encounter: 5\' 5"  (1.651 m).   Weight as of this encounter: 100.5 kg. Advance diet Up with therapy Acute postop blood loss anemia with hemoglobin of 9.3.  Continue with iron supplement. Vital signs stable Orthostatic hypotension - BP meds dc. Cut back on narcotics. Encourage po fluids. Will check progress with PT today Care management to assist with discharge.  Patient prefers to go home with home health PT.  Patient will be discharging home to sister's  home.  DVT Prophylaxis - Lovenox, TED hose, and SCDs Weight-Bearing as tolerated to left leg   T. Cranston Neighbor, PA-C [facility] 04/05/2021, 7:40 AM

## 2021-04-07 LAB — GLUCOSE, CAPILLARY: Glucose-Capillary: 146 mg/dL — ABNORMAL HIGH (ref 70–99)

## 2021-05-07 ENCOUNTER — Encounter: Payer: Self-pay | Admitting: Orthopedic Surgery

## 2021-10-04 ENCOUNTER — Encounter: Payer: Self-pay | Admitting: Intensive Care

## 2021-10-04 ENCOUNTER — Emergency Department: Payer: Medicare Other

## 2021-10-04 ENCOUNTER — Other Ambulatory Visit: Payer: Self-pay

## 2021-10-04 ENCOUNTER — Emergency Department
Admission: EM | Admit: 2021-10-04 | Discharge: 2021-10-04 | Disposition: A | Discharge status: Home / Self Care | Payer: Medicare Other | Attending: Emergency Medicine | Admitting: Emergency Medicine

## 2021-10-04 DIAGNOSIS — S8002XA Contusion of left knee, initial encounter: Secondary | ICD-10-CM | POA: Diagnosis not present

## 2021-10-04 DIAGNOSIS — W010XXA Fall on same level from slipping, tripping and stumbling without subsequent striking against object, initial encounter: Secondary | ICD-10-CM | POA: Insufficient documentation

## 2021-10-04 DIAGNOSIS — S7002XA Contusion of left hip, initial encounter: Secondary | ICD-10-CM | POA: Diagnosis not present

## 2021-10-04 DIAGNOSIS — Z96642 Presence of left artificial hip joint: Secondary | ICD-10-CM | POA: Insufficient documentation

## 2021-10-04 DIAGNOSIS — M25512 Pain in left shoulder: Secondary | ICD-10-CM | POA: Diagnosis not present

## 2021-10-04 DIAGNOSIS — W19XXXA Unspecified fall, initial encounter: Secondary | ICD-10-CM

## 2021-10-04 DIAGNOSIS — S79912A Unspecified injury of left hip, initial encounter: Secondary | ICD-10-CM | POA: Diagnosis present

## 2021-10-04 DIAGNOSIS — Z96652 Presence of left artificial knee joint: Secondary | ICD-10-CM | POA: Diagnosis not present

## 2021-10-04 DIAGNOSIS — E119 Type 2 diabetes mellitus without complications: Secondary | ICD-10-CM | POA: Insufficient documentation

## 2021-10-04 MED ORDER — NAPROXEN 500 MG PO TABS
500.0000 mg | ORAL_TABLET | Freq: Once | ORAL | Status: AC
  Administered 2021-10-04: 500 mg via ORAL
  Filled 2021-10-04: qty 1

## 2021-10-04 MED ORDER — HYDROCODONE-ACETAMINOPHEN 5-325 MG PO TABS
1.0000 | ORAL_TABLET | Freq: Once | ORAL | Status: DC
Start: 2021-10-04 — End: 2021-10-04
  Filled 2021-10-04: qty 1

## 2021-10-04 MED ORDER — NAPROXEN 500 MG PO TABS: 500.0000 mg | ORAL_TABLET | Freq: Two times a day (BID) | ORAL | 2 refills | Status: DC

## 2021-10-04 NOTE — ED Triage Notes (Signed)
Arrived by EMS from home. Patient had mechanical fall on front porch. C/o left leg, hip, buttock pain. Denies LOC or hitting head. No blood thinners. Able to ambulate with cane. 156/80 blood pressure, 80HR, 175 glucose, 98% RA. HX diabetes.

## 2021-10-04 NOTE — ED Provider Notes (Signed)
[facility] Provider Note    Event Date/Time   First MD Initiated Contact with Patient 10/04/21 0825     (approximate)   History   Fall   HPI  Gina Tate is a 71 y.o. female with a history of diabetes, DVT, arthritis who presents after a fall.  Patient reports she slipped and landed on her left buttock.  She complains of pain in her tailbone, left hip, left knee and some mild pain in her left shoulder.  She was able to ambulate afterwards.  She reports her knee and hip has been replaced on the left.  No head injury.  No abdominal pain.     Physical Exam   Triage Vital Signs: ED Triage Vitals  Enc Vitals Group     BP 10/04/21 0806 (!) 157/87     Pulse Rate 10/04/21 0806 74     Resp 10/04/21 0806 16     Temp 10/04/21 0806 98.1 F (36.7 C)     Temp Source 10/04/21 0806 Oral     SpO2 10/04/21 0806 96 %     Weight 10/04/21 0807 101.2 kg (223 lb)     Height 10/04/21 0807 1.651 m (5\' 5" )     Head Circumference --      Peak Flow --      Pain Score 10/04/21 0807 8     Pain Loc --      Pain Edu? --      Excl. in [place]? --     Most recent vital signs: Vitals:   10/04/21 0806  BP: (!) 157/87  Pulse: 74  Resp: 16  Temp: 98.1 F (36.7 C)  SpO2: 96%     General: Awake, no distress.  CV:  Good peripheral perfusion.  No chest wall pain Resp:  Normal effort.  Abd:  No distention.  No tenderness palpation Other:  Extremities: No significant pain with axial load on the left hip, is able to extend at the hip and at the knee without significant pain.  Normal range of motion of the left upper extremities.,  No bony abnormalities, no vertebral tenderness to palpation   ED Results / Procedures / Treatments   Labs (all labs ordered are listed, but only abnormal results are displayed) Labs Reviewed - No data to display   EKG     RADIOLOGY X-ray of the hip and pelvis reviewed by me Survey of the left knee reviewed by  me    PROCEDURES:  Critical Care performed:   Procedures   MEDICATIONS ORDERED IN ED: Medications  naproxen (NAPROSYN) tablet 500 mg (500 mg Oral Given 10/04/21 0937)     IMPRESSION / MDM / ASSESSMENT AND PLAN / ED COURSE  I reviewed the triage vital signs and the nursing notes.  Patient presents after mechanical fall as detailed above.  No head injury.  Primary pain is to the left buttocks and left hip, will give p.o. Vicodin.  Patient refused p.o. Vicodin, will give Naprosyn instead  X-rays reviewed by me, no evidence of acute fracture.  Appropriate for discharge with outpatient follow-up with Ortho/PCP if continued discomfort  Will Rx analgesics          FINAL CLINICAL IMPRESSION(S) / ED DIAGNOSES   Final diagnoses:  Fall, initial encounter  Contusion of left hip, initial encounter  Contusion of left knee, initial encounter     Rx / DC Orders   ED Discharge Orders     None  Note:  This document was prepared using Dragon voice recognition software and may include unintentional dictation errors.   Jene Every, MD 10/04/21 [phone]

## 2022-05-06 ENCOUNTER — Other Ambulatory Visit: Payer: Self-pay | Admitting: Student

## 2022-05-06 ENCOUNTER — Other Ambulatory Visit: Payer: Self-pay | Admitting: Neurosurgery

## 2022-05-06 DIAGNOSIS — R519 Headache, unspecified: Secondary | ICD-10-CM

## 2022-05-06 DIAGNOSIS — R200 Anesthesia of skin: Secondary | ICD-10-CM

## 2022-05-06 DIAGNOSIS — R413 Other amnesia: Secondary | ICD-10-CM

## 2022-05-10 ENCOUNTER — Ambulatory Visit
Admission: RE | Admit: 2022-05-10 | Discharge: 2022-05-10 | Disposition: A | Discharge status: Home / Self Care | Payer: Medicare Other | Source: Ambulatory Visit | Attending: Student | Admitting: Student

## 2022-05-10 DIAGNOSIS — R200 Anesthesia of skin: Secondary | ICD-10-CM | POA: Insufficient documentation

## 2022-05-10 DIAGNOSIS — R413 Other amnesia: Secondary | ICD-10-CM | POA: Diagnosis present

## 2022-05-10 DIAGNOSIS — R519 Headache, unspecified: Secondary | ICD-10-CM | POA: Diagnosis present

## 2022-05-11 NOTE — Progress Notes (Signed)
MRI brain essentially normal for age. Please share with neurology

## 2022-05-11 NOTE — Progress Notes (Signed)
No significant MRI abnormality. Essentially normal. Her neurologist to review on her visit in Dec

## 2022-05-13 NOTE — Progress Notes (Signed)
Called and spoke to patient she voiced understanding

## 2022-11-27 ENCOUNTER — Emergency Department: Payer: Medicare Other

## 2022-11-27 ENCOUNTER — Emergency Department
Admission: EM | Admit: 2022-11-27 | Discharge: 2022-11-27 | Disposition: A | Discharge status: Home / Self Care | Payer: Medicare Other | Attending: Emergency Medicine | Admitting: Emergency Medicine

## 2022-11-27 ENCOUNTER — Encounter: Payer: Self-pay | Admitting: Intensive Care

## 2022-11-27 ENCOUNTER — Other Ambulatory Visit: Payer: Self-pay

## 2022-11-27 DIAGNOSIS — Z203 Contact with and (suspected) exposure to rabies: Secondary | ICD-10-CM | POA: Insufficient documentation

## 2022-11-27 DIAGNOSIS — Z2914 Encounter for prophylactic rabies immune globin: Secondary | ICD-10-CM | POA: Insufficient documentation

## 2022-11-27 DIAGNOSIS — W540XXA Bitten by dog, initial encounter: Secondary | ICD-10-CM | POA: Diagnosis not present

## 2022-11-27 DIAGNOSIS — S61451A Open bite of right hand, initial encounter: Secondary | ICD-10-CM | POA: Diagnosis not present

## 2022-11-27 DIAGNOSIS — M79641 Pain in right hand: Secondary | ICD-10-CM | POA: Diagnosis present

## 2022-11-27 DIAGNOSIS — Z23 Encounter for immunization: Secondary | ICD-10-CM | POA: Diagnosis not present

## 2022-11-27 MED ORDER — RABIES IMMUNE GLOBULIN 150 UNIT/ML IM INJ
450.0000 [IU] | INJECTION | Freq: Once | INTRAMUSCULAR | Status: AC
  Administered 2022-11-27: 450 [IU]
  Filled 2022-11-27: qty 4

## 2022-11-27 MED ORDER — RABIES VACCINE, PCEC IM SUSR
1.0000 mL | Freq: Once | INTRAMUSCULAR | Status: AC
  Administered 2022-11-27: 1 mL via INTRAMUSCULAR
  Filled 2022-11-27: qty 1

## 2022-11-27 MED ORDER — RABIES IMMUNE GLOBULIN 150 UNIT/ML IM INJ: 20.0000 [IU]/kg | INJECTION | Freq: Once | INTRAMUSCULAR | Status: DC

## 2022-11-27 MED ORDER — AMOXICILLIN-POT CLAVULANATE 875-125 MG PO TABS: 1.0000 | ORAL_TABLET | Freq: Two times a day (BID) | ORAL | 0 refills | Status: AC

## 2022-11-27 MED ORDER — RABIES IMMUNE GLOBULIN 150 UNIT/ML IM INJ
1500.0000 [IU] | INJECTION | Freq: Once | INTRAMUSCULAR | Status: AC
  Administered 2022-11-27: 1500 [IU] via INTRAMUSCULAR
  Filled 2022-11-27: qty 10

## 2022-11-27 NOTE — ED Notes (Signed)
Spoke with [facility], Police officer wanting to confirm the address the dog lives. Spoke with pt, and confirmed that it is [address]. Police updated on the address.

## 2022-11-27 NOTE — ED Notes (Signed)
Pt given discharge instructions, pt voiced understanding of instructions. Pt unable to sign due to pen pad not working. 

## 2022-11-27 NOTE — ED Provider Notes (Signed)
[facility] Provider Note    None    (approximate)   History   Animal Bite   HPI  Gina Tate is a 72 y.o. female presents to the ED with complaint of right hand pain.  Patient was bitten by her neighbors dog yesterday, unprovoked.  Patient did not report to animal control and states that neighbor stated the dog is up-to-date on immunizations.  She reports that she is up-to-date on tetanus immunization less than 5 years.     Physical Exam   Triage Vital Signs: ED Triage Vitals  Enc Vitals Group     BP 11/27/22 0813 (!) 169/98     Pulse Rate 11/27/22 0813 77     Resp 11/27/22 0813 16     Temp 11/27/22 0809 98.6 F (37 C)     Temp Source 11/27/22 0809 Oral     SpO2 11/27/22 0813 99 %     Weight 11/27/22 0810 219 lb (99.3 kg)     Height 11/27/22 0810 5\' 5"  (1.651 m)     Head Circumference --      Peak Flow --      Pain Score 11/27/22 0810 10     Pain Loc --      Pain Edu? --      Excl. in [place]? --     Most recent vital signs: Vitals:   11/27/22 0809 11/27/22 0813  BP:  (!) 169/98  Pulse:  77  Resp:  16  Temp: 98.6 F (37 C)   SpO2:  99%     General: Awake, no distress.  CV:  Good peripheral perfusion.  Resp:  Normal effort.  Abd:  No distention.  Other:  Right hand dorsal aspect with ecchymosis and soft tissue edema.  Mid second, third and fourth metacarpal area.  Patient is still able to move digits without any difficulty.  There are a few areas that are concerning for either a scratch or puncture from the dog's tooth.  No drainage from these areas and no active bleeding.  Motor or sensory function intact.  Capillary refills less than 3 seconds.   ED Results / Procedures / Treatments   Labs (all labs ordered are listed, but only abnormal results are displayed) Labs Reviewed - No data to display    RADIOLOGY Right hand x-ray images were reviewed by myself independent of the radiologist and no fracture or opaque foreign body  was noted.    PROCEDURES:  Critical Care performed:   Procedures   MEDICATIONS ORDERED IN ED: Medications  rabies vaccine (RABAVERT) injection 1 mL (1 mL Intramuscular Given 11/27/22 1017)  rabies immune globulin (HYPERRAB/KEDRAB) injection 1,500 Units (1,500 Units Intramuscular Given 11/27/22 1025)    And  rabies immune globulin (HYPERRAB/KEDRAB) injection 450 Units (450 Units Infiltration Given 11/27/22 1025)     IMPRESSION / MDM / ASSESSMENT AND PLAN / ED COURSE  I reviewed the triage vital signs and the nursing notes.   Differential diagnosis includes, but is not limited to, dog bite right hand, infection, contusion, possible need for prophylactic rabies immunizations.  72 year old female presents to the ED with a dog bite to her right hand that occurred yesterday.  This was reported to animal control by ED staff and initially animal control stated that the dog was up-to-date on immunizations.  He then called back prior to discharge to say that animal came from a shelter and it is uncertain whether the dog had a 1 year  rabies vaccine or a 3-year rabies vaccine and he will be unable to get this information until possibly Monday at the earliest.  This would be exceeded a 72-hour, and I discussed with patient the need for getting rabies prophylaxis since the history of the dog is somewhat in question.  A schedule of rabies vaccine dates was given to her.  She is aware that she needs to watch the area closely for any signs of infection and clean daily with mild soap and water.  A prescription for Augmentin was sent to the pharmacy for her to begin taking today until completely finished.      Patient's presentation is most consistent with acute complicated illness / injury requiring diagnostic workup.  FINAL CLINICAL IMPRESSION(S) / ED DIAGNOSES   Final diagnoses:  Dog bite of right hand, initial encounter  Need for post exposure prophylaxis for rabies     Rx / DC Orders   ED  Discharge Orders          Ordered    amoxicillin-clavulanate (AUGMENTIN) 875-125 MG tablet  2 times daily        11/27/22 0926             Note:  This document was prepared using Dragon voice recognition software and may include unintentional dictation errors.   Johnn Hai, PA-C 11/27/22 1122    Naaman Plummer, MD 11/27/22 [phone]

## 2022-11-27 NOTE — ED Notes (Addendum)
Pt bitten by neighbors dog. According to pt, the last time the dog was out of the yard and unleashed, the pt called the police, who told the neighbor to keep the dog on a leash when it is out. Pt bitten 3/28. [place] non- emergency line contacted by this RN to report the dog bite incident.

## 2022-11-27 NOTE — ED Notes (Signed)
Spoke with officer Terrell of [place] [employer] again, and was informed that the dog was bought from a shelter, but the owner is unsure of the timeline. Officer Enid Derry says that the Shelter is closed until Monday, so there is no way to get an exact date on the dogs vaccinations. PA Summers notified, and will update pt on the remaining plan of care.

## 2022-11-27 NOTE — ED Triage Notes (Signed)
Patient has swollen right hand from animal bite yesterday. She states it was her neighbors dog

## 2022-11-27 NOTE — ED Notes (Signed)
Spoke with officer Terrell of [place] [employer], He asked that the pt come by the station after she leaves the hospital with her discharge paper work to give her statement. He also asked this RN to inform pt that the he spoke with the owners of the dg that bit her, and it is up to date on all of its vaccinations. Pt notified of above information, and PA Summers updated as well.

## 2022-11-27 NOTE — Discharge Instructions (Addendum)
Follow-up with your primary care provider if any continued problems.  Clean the area daily with mild soap and water and watch for any signs of infection.  Begin taking the antibiotic today until completely finished.  If any signs of infection such as pus, fever, redness return to the emergency department immediately.  I also take Tylenol or ibuprofen as needed for inflammation or pain.  Elevate your hand often to reduce swelling.  Return for remaining rabies injection here or [facility] or [facility]  4/1 4/5 4/12

## 2022-11-30 ENCOUNTER — Emergency Department
Admission: EM | Admit: 2022-11-30 | Discharge: 2022-11-30 | Disposition: A | Discharge status: Home / Self Care | Payer: Medicare Other | Attending: Emergency Medicine | Admitting: Emergency Medicine

## 2022-11-30 ENCOUNTER — Other Ambulatory Visit: Payer: Self-pay

## 2022-11-30 DIAGNOSIS — Z23 Encounter for immunization: Secondary | ICD-10-CM | POA: Insufficient documentation

## 2022-11-30 DIAGNOSIS — Z2914 Encounter for prophylactic rabies immune globin: Secondary | ICD-10-CM | POA: Diagnosis not present

## 2022-11-30 DIAGNOSIS — Z4800 Encounter for change or removal of nonsurgical wound dressing: Secondary | ICD-10-CM | POA: Insufficient documentation

## 2022-11-30 DIAGNOSIS — Z5189 Encounter for other specified aftercare: Secondary | ICD-10-CM

## 2022-11-30 DIAGNOSIS — Z203 Contact with and (suspected) exposure to rabies: Secondary | ICD-10-CM | POA: Diagnosis not present

## 2022-11-30 MED ORDER — RABIES VACCINE, PCEC IM SUSR
1.0000 mL | Freq: Once | INTRAMUSCULAR | Status: AC
  Administered 2022-11-30: 1 mL via INTRAMUSCULAR
  Filled 2022-11-30: qty 1

## 2022-11-30 NOTE — ED Provider Notes (Signed)
[facility] Provider Note  Patient Contact: 7:01 PM (approximate)   History   No chief complaint on file.   HPI  Gina Tate is a 72 y.o. female who presents here for her second rabies shot.  See previous note for details regarding original injury.  She denies any complications with the bite site on the right hand.  Still has some bruising on mild edema around the site but no signs of infection.  Patient is here for second rabies vaccine only.     Physical Exam   Triage Vital Signs: ED Triage Vitals  Enc Vitals Group     BP 11/30/22 1701 (!) 174/87     Pulse Rate 11/30/22 1701 81     Resp 11/30/22 1701 18     Temp 11/30/22 1701 98.5 F (36.9 C)     Temp Source 11/30/22 1701 Oral     SpO2 11/30/22 1701 96 %     Weight 11/30/22 1701 219 lb (99.3 kg)     Height 11/30/22 1701 5\' 5"  (1.651 m)     Head Circumference --      Peak Flow --      Pain Score 11/30/22 1704 7     Pain Loc --      Pain Edu? --      Excl. in [place]? --     Most recent vital signs: Vitals:   11/30/22 1701  BP: (!) 174/87  Pulse: 81  Resp: 18  Temp: 98.5 F (36.9 C)  SpO2: 96%     General: Alert and in no acute distress.  Cardiovascular:  Good peripheral perfusion Respiratory: Normal respiratory effort without tachypnea or retractions. Lungs CTAB.  Musculoskeletal: Full range of motion to all extremities.  Visualization of the right hand reveals scabbing consistent with a bite to the right dorsal hand.  Mild surrounding edema and ecchymosis but no erythema and no warmth.  Full range of motion still to all digits. Neurologic:  No gross focal neurologic deficits are appreciated.  Skin:   No rash noted Other:   ED Results / Procedures / Treatments   Labs (all labs ordered are listed, but only abnormal results are displayed) Labs Reviewed - No data to display   EKG     RADIOLOGY    No results found.  PROCEDURES:  Critical Care performed:  No  Procedures   MEDICATIONS ORDERED IN ED: Medications - No data to display   IMPRESSION / MDM / [place] / ED COURSE  I reviewed the triage vital signs and the nursing notes.                                 Differential diagnosis includes, but is not limited to, encounter for rabies vaccine, wound check, wound complication, wound infection.    Patient's presentation is most consistent with acute presentation with potential threat to life or bodily function.   Patient's diagnosis is consistent with encounter for second rabies vaccine. Patient presents emergency department for second rabies vaccine.no evidence of infection or complication to the original bite site.  See previous note for additional details of the original injury.  Patient will have rabies vaccine, will follow-up in 3 days here or at urgent care for third rabies vaccine.. Patient is given ED precautions to return to the ED for any worsening or new symptoms.     FINAL CLINICAL IMPRESSION(S) / ED  DIAGNOSES   Final diagnoses:  Need for rabies vaccination  Visit for wound check     Rx / DC Orders   ED Discharge Orders     None        Note:  This document was prepared using Dragon voice recognition software and may include unintentional dictation errors.   Brynda Peon 11/30/22 1906    Harvest Dark, MD 11/30/22 2224

## 2022-11-30 NOTE — ED Triage Notes (Signed)
Pt to ED via POV from home. Pt to ED for 2nd round of rabies injection. Pt has animal bite to right hand. Pt states does not seem worse but is still unable to close hand.

## 2022-12-04 ENCOUNTER — Ambulatory Visit
Admission: EM | Admit: 2022-12-04 | Discharge: 2022-12-04 | Disposition: A | Discharge status: Home / Self Care | Payer: Medicare Other | Attending: Emergency Medicine | Admitting: Emergency Medicine

## 2022-12-04 DIAGNOSIS — Z203 Contact with and (suspected) exposure to rabies: Secondary | ICD-10-CM | POA: Diagnosis not present

## 2022-12-04 MED ORDER — RABIES VACCINE, PCEC IM SUSR
1.0000 mL | Freq: Once | INTRAMUSCULAR | Status: AC
  Administered 2022-12-04: 1 mL via INTRAMUSCULAR

## 2022-12-04 NOTE — ED Notes (Signed)
Patient scheduled appointment for final rabies vaccine 4/12 @4pm .

## 2022-12-04 NOTE — ED Triage Notes (Signed)
Patient to Urgent Care for follow up rabies vaccine.   Patient received 1st and 2nd vaccines at [facility] after an animal bite to her right hand.

## 2022-12-11 ENCOUNTER — Ambulatory Visit
Admission: EM | Admit: 2022-12-11 | Discharge: 2022-12-11 | Disposition: A | Discharge status: Home / Self Care | Payer: Medicare Other | Attending: Urgent Care | Admitting: Urgent Care

## 2022-12-11 DIAGNOSIS — Z203 Contact with and (suspected) exposure to rabies: Secondary | ICD-10-CM | POA: Diagnosis not present

## 2022-12-11 MED ORDER — RABIES VACCINE, PCEC IM SUSR
1.0000 mL | Freq: Once | INTRAMUSCULAR | Status: AC
  Administered 2022-12-11: 1 mL via INTRAMUSCULAR

## 2022-12-11 NOTE — ED Triage Notes (Signed)
Patient presents to UC for day 14 rabies vaccine dose.

## 2023-01-04 LAB — COLOGUARD: COLOGUARD: NEGATIVE

## 2023-01-04 LAB — EXTERNAL GENERIC LAB PROCEDURE: COLOGUARD: NEGATIVE

## 2023-08-23 ENCOUNTER — Other Ambulatory Visit
Admission: RE | Admit: 2023-08-23 | Discharge: 2023-08-23 | Disposition: A | Discharge status: Home / Self Care | Payer: Medicare Other | Source: Ambulatory Visit | Attending: Specialist | Admitting: Specialist

## 2023-08-23 DIAGNOSIS — R058 Other specified cough: Secondary | ICD-10-CM | POA: Insufficient documentation

## 2023-08-23 DIAGNOSIS — R0602 Shortness of breath: Secondary | ICD-10-CM | POA: Insufficient documentation

## 2023-08-23 LAB — BRAIN NATRIURETIC PEPTIDE: B Natriuretic Peptide: 189.1 pg/mL — ABNORMAL HIGH (ref 0.0–100.0)

## 2023-11-02 ENCOUNTER — Ambulatory Visit (INDEPENDENT_AMBULATORY_CARE_PROVIDER_SITE_OTHER): Payer: Medicare Other | Admitting: Dermatology

## 2023-11-02 ENCOUNTER — Encounter: Payer: Self-pay | Admitting: Dermatology

## 2023-11-02 DIAGNOSIS — L821 Other seborrheic keratosis: Secondary | ICD-10-CM | POA: Diagnosis not present

## 2023-11-02 DIAGNOSIS — L918 Other hypertrophic disorders of the skin: Secondary | ICD-10-CM | POA: Diagnosis not present

## 2023-11-02 DIAGNOSIS — L820 Inflamed seborrheic keratosis: Secondary | ICD-10-CM

## 2023-11-02 DIAGNOSIS — W098XXA Fall on or from other playground equipment, initial encounter: Secondary | ICD-10-CM

## 2023-11-02 DIAGNOSIS — D229 Melanocytic nevi, unspecified: Secondary | ICD-10-CM

## 2023-11-02 DIAGNOSIS — D225 Melanocytic nevi of trunk: Secondary | ICD-10-CM | POA: Diagnosis not present

## 2023-11-02 DIAGNOSIS — L578 Other skin changes due to chronic exposure to nonionizing radiation: Secondary | ICD-10-CM

## 2023-11-02 DIAGNOSIS — D1801 Hemangioma of skin and subcutaneous tissue: Secondary | ICD-10-CM

## 2023-11-02 NOTE — Patient Instructions (Signed)
Cryotherapy Aftercare  Wash gently with soap and water everyday.   Apply Vaseline Jelly daily until healed.    Recommend daily broad spectrum sunscreen SPF 30+ to sun-exposed areas, reapply every 2 hours as needed. Call for new or changing lesions.  Staying in the shade or wearing long sleeves, sun glasses (UVA+UVB protection) and wide brim hats (4-inch brim around the entire circumference of the hat) are also recommended for sun protection.    Seborrheic Keratosis  What causes seborrheic keratoses? Seborrheic keratoses are harmless, common skin growths that first appear during adult life.  As time goes by, more growths appear.  Some people may develop a large number of them.  Seborrheic keratoses appear on both covered and uncovered body parts.  They are not caused by sunlight.  The tendency to develop seborrheic keratoses can be inherited.  They vary in color from skin-colored to gray, brown, or even black.  They can be either smooth or have a rough, warty surface.   Seborrheic keratoses are superficial and look as if they were stuck on the skin.  Under the microscope this type of keratosis looks like layers upon layers of skin.  That is why at times the top layer may seem to fall off, but the rest of the growth remains and re-grows.    Treatment Seborrheic keratoses do not need to be treated, but can easily be removed in the office.  Seborrheic keratoses often cause symptoms when they rub on clothing or jewelry.  Lesions can be in the way of shaving.  If they become inflamed, they can cause itching, soreness, or burning.  Removal of a seborrheic keratosis can be accomplished by freezing, burning, or surgery. If any spot bleeds, scabs, or grows rapidly, please return to have it checked, as these can be an indication of a skin cancer.  Due to recent changes in healthcare laws, you may see results of your pathology and/or laboratory studies on MyChart before the doctors have had a chance to  review them. We understand that in some cases there may be results that are confusing or concerning to you. Please understand that not all results are received at the same time and often the doctors may need to interpret multiple results in order to provide you with the best plan of care or course of treatment. Therefore, we ask that you please give [place] 2 business days to thoroughly review all your results before contacting the office for clarification. Should we see a critical lab result, you will be contacted sooner.   If You Need Anything After Your Visit  If you have any questions or concerns for your doctor, please call our main line at [phone] and press option 4 to reach your doctor's medical assistant. If no one answers, please leave a voicemail as directed and we will return your call as soon as possible. Messages left after 4 pm will be answered the following business day.   You may also send [place] a message via MyChart. We typically respond to MyChart messages within 1-2 business days.  For prescription refills, please ask your pharmacy to contact our office. Our fax number is [phone].  If you have an urgent issue when the clinic is closed that cannot wait until the next business day, you can page your doctor at the number below.    Please note that while we do our best to be available for urgent issues outside of office hours, we are not available 24/7.   If  you have an urgent issue and are unable to reach [place], you may choose to seek medical care at your doctor's office, retail clinic, urgent care center, or emergency room.  If you have a medical emergency, please immediately call 911 or go to the emergency department.  Pager Numbers  - Dr. Gwen Pounds: [phone]  - Dr. Roseanne Reno: [phone]  - Dr. Katrinka Blazing: [phone]   In the event of inclement weather, please call our main line at [phone] for an update on the status of any delays or closures.  Dermatology  Medication Tips: Please keep the boxes that topical medications come in in order to help keep track of the instructions about where and how to use these. Pharmacies typically print the medication instructions only on the boxes and not directly on the medication tubes.   If your medication is too expensive, please contact our office at [phone] option 4 or send [place] a message through MyChart.   We are unable to tell what your co-pay for medications will be in advance as this is different depending on your insurance coverage. However, we may be able to find a substitute medication at lower cost or fill out paperwork to get insurance to cover a needed medication.   If a prior authorization is required to get your medication covered by your insurance company, please allow [place] 1-2 business days to complete this process.  Drug prices often vary depending on where the prescription is filled and some pharmacies may offer cheaper prices.  The website www.goodrx.com contains coupons for medications through different pharmacies. The prices here do not account for what the cost may be with help from insurance (it may be cheaper with your insurance), but the website can give you the price if you did not use any insurance.  - You can print the associated coupon and take it with your prescription to the pharmacy.  - You may also stop by our office during regular business hours and pick up a GoodRx coupon card.  - If you need your prescription sent electronically to a different pharmacy, notify our office through [facility] or by phone at [phone] option 4.     Si Usted Necesita Algo Despus de Su Visita  Tambin puede enviarnos un mensaje a travs de Clinical cytogeneticist. Por lo general respondemos a los mensajes de MyChart en el transcurso de 1 a 2 das hbiles.  Para renovar recetas, por favor pida a su farmacia que se ponga en contacto con nuestra oficina. Annie Sable de fax es [place] [phone].  Si  tiene un asunto urgente cuando la clnica est cerrada y que no puede esperar hasta el siguiente da hbil, puede llamar/localizar a su doctor(a) al nmero que aparece a continuacin.   Por favor, tenga en cuenta que aunque hacemos todo lo posible para estar disponibles para asuntos urgentes fuera del horario de [place], no estamos disponibles las 24 horas del da, los 7 [address]  [address] de la [place].   Si tiene un problema urgente y no puede comunicarse con nosotros, puede optar por buscar atencin mdica  en el consultorio de su doctor(a), en una clnica privada, en un centro de atencin urgente o en una sala de emergencias.  Si tiene Engineer, drilling, por favor llame inmediatamente al 911 o vaya a la sala de emergencias.  Nmeros de bper  - Dr. Gwen Pounds: [phone]  - Dra. Roseanne Reno: [phone]  - Dr. Katrinka Blazing: [phone]   En caso de inclemencias del tiempo, por favor llame a nuestra lnea  principal al [phone] para una actualizacin sobre el [place] de cualquier retraso o cierre.  Consejos para la medicacin en dermatologa: Por favor, guarde las cajas en las que vienen los medicamentos de uso tpico para ayudarle a seguir las instrucciones sobre dnde y cmo usarlos. Las farmacias generalmente imprimen las instrucciones del medicamento slo en las cajas y no directamente en los tubos del [place].   Si su medicamento es muy caro, por favor, pngase en contacto con Rolm [place] llamando al [phone] y presione la opcin 4 o envenos un mensaje a travs de Clinical cytogeneticist.   No podemos decirle cul ser su copago por los medicamentos por adelantado ya que esto es diferente dependiendo de la cobertura de su seguro. Sin embargo, es posible que podamos encontrar un medicamento sustituto a Audiological scientist un formulario para que el seguro cubra el medicamento que se considera necesario.   Si se requiere una autorizacin previa para que su compaa de seguros [place] su medicamento, por  favor permtanos de 1 a 2 das hbiles para completar [address]  Los precios de los medicamentos varan con frecuencia dependiendo del Environmental consultant de dnde se surte la receta y alguna farmacias pueden ofrecer precios ms baratos.  El sitio web www.goodrx.com tiene cupones para medicamentos de Health and safety inspector. Los precios aqu no tienen en cuenta lo que podra costar con la ayuda del seguro (puede ser ms barato con su seguro), pero el sitio web puede darle el precio si no utiliz Tourist information centre manager.  - Puede imprimir el cupn correspondiente y llevarlo con su receta a la farmacia.  - Tambin puede pasar por nuestra oficina durante el horario de atencin regular y Education officer, museum una tarjeta de cupones de GoodRx.  - Si necesita que su receta se enve electrnicamente a una farmacia diferente, informe a nuestra oficina a travs de MyChart de [place] o por telfono llamando al [phone] y presione la opcin 4.

## 2023-11-02 NOTE — Progress Notes (Signed)
 New Patient Visit   Subjective  Gina Tate is a 73 y.o. female who presents for the following: Spots. Patient C/O several "moles" she would like removed. She states they area itchy all the time. Face, back, breasts, neck.   The patient has spots, moles and lesions to be evaluated, some may be new or changing and the patient may have concern these could be cancer.   The following portions of the chart were reviewed this encounter and updated as appropriate: medications, allergies, medical history  Review of Systems:  No other skin or systemic complaints except as noted in HPI or Assessment and Plan.  Objective  Well appearing patient in no apparent distress; mood and affect are within normal limits.  A focused examination was performed of the following areas: Face, chest, back, abdomen, neck  Relevant physical exam findings are noted in the Assessment and Plan.  Back x9, abdomen x3, R temple x2, R cheek x1, L neck x1 (16) Erythematous keratotic or waxy stuck-on papule or plaque.  Assessment & Plan   SEBORRHEIC KERATOSIS - Stuck-on, waxy, tan-brown papules and/or plaques at torso - Benign-appearing - Discussed benign etiology and prognosis. - Observe - Call for any changes  HEMANGIOMA Exam: red papules at torso Discussed benign nature. Recommend observation. Call for changes.  MELANOCYTIC NEVUS Exam: 3 mm tan thin papule at spinal mid upper back, 5 x 3 mm speckled brown macule at left posterior shoulder  Treatment Plan: Benign appearing on exam today. Recommend observation. Call clinic for new or changing moles. Recommend daily use of broad spectrum spf 30+ sunscreen to sun-exposed areas.   Acrochordons (Skin Tags) - Fleshy, skin-colored pedunculated papules at neck and axillae - Benign appearing.  - Observe. - If desired, they can be removed with an in office procedure that is not covered by insurance. - Please call the clinic if you notice any new or changing  lesions. - Can remove lesions if bothersome.  ACTINIC DAMAGE - chronic, secondary to cumulative UV radiation exposure/sun exposure over time - diffuse scaly erythematous macules with underlying dyspigmentation - Recommend daily broad spectrum sunscreen SPF 30+ to sun-exposed areas, reapply every 2 hours as needed.  - Recommend staying in the shade or wearing long sleeves, sun glasses (UVA+UVB protection) and wide brim hats (4-inch brim around the entire circumference of the hat). - Call for new or changing lesions.   INFLAMED SEBORRHEIC KERATOSIS (16) Back x9, abdomen x3, R temple x2, R cheek x1, L neck x1 (16) Symptomatic, irritating, patient would like treated.   Advised will treat selected symptomatic lesions at a time. Cannot treat all at once Destruction of lesion - Back x9, abdomen x3, R temple x2, R cheek x1, L neck x1 (16)  Destruction method: cryotherapy   Informed consent: discussed and consent obtained   Lesion destroyed using liquid nitrogen: Yes   Region frozen until ice ball extended beyond lesion: Yes   Outcome: patient tolerated procedure well with no complications   Post-procedure details: wound care instructions given   Additional details:  Prior to procedure, discussed risks of blister formation, small wound, skin dyspigmentation, or rare scar following cryotherapy. Recommend Vaseline ointment to treated areas while healing.  SEBORRHEIC KERATOSIS   HEMANGIOMA OF SKIN   NEVUS   SKIN TAG   ACTINIC SKIN DAMAGE     Return in about 2 months (around 01/02/2024) for ISK Follow Up . Or skin tag removal  I, Lawson Radar, CMA, am acting as scribe for [employer]  Roseanne Reno, MD.   Documentation: I have reviewed the above documentation for accuracy and completeness, and I agree with the above.  Willeen Niece, MD

## 2023-12-14 ENCOUNTER — Other Ambulatory Visit: Payer: Self-pay | Admitting: Physician Assistant

## 2023-12-14 DIAGNOSIS — R413 Other amnesia: Secondary | ICD-10-CM

## 2023-12-16 ENCOUNTER — Ambulatory Visit
Admission: RE | Admit: 2023-12-16 | Discharge: 2023-12-16 | Disposition: A | Discharge status: Home / Self Care | Source: Ambulatory Visit | Attending: Physician Assistant | Admitting: Physician Assistant

## 2023-12-16 DIAGNOSIS — R413 Other amnesia: Secondary | ICD-10-CM | POA: Diagnosis present

## 2024-01-10 ENCOUNTER — Ambulatory Visit: Payer: Self-pay | Admitting: Dermatology

## 2024-01-10 DIAGNOSIS — L820 Inflamed seborrheic keratosis: Secondary | ICD-10-CM

## 2024-01-10 DIAGNOSIS — L821 Other seborrheic keratosis: Secondary | ICD-10-CM | POA: Diagnosis not present

## 2024-01-10 DIAGNOSIS — L918 Other hypertrophic disorders of the skin: Secondary | ICD-10-CM

## 2024-01-10 DIAGNOSIS — L578 Other skin changes due to chronic exposure to nonionizing radiation: Secondary | ICD-10-CM | POA: Diagnosis not present

## 2024-01-10 DIAGNOSIS — W908XXD Exposure to other nonionizing radiation, subsequent encounter: Secondary | ICD-10-CM

## 2024-01-10 NOTE — Progress Notes (Signed)
   Follow-Up Visit   Subjective  Gina Tate is a 73 y.o. female who presents for the following: here for isk treatment reports spots at face, back, chest, under breast and at neck she would like treated. Itchy. Bothersome at neck, back.  Hx of isks previously treated with cryotherapy- they all cleared up.   The patient has spots, moles and lesions to be evaluated, some may be new or changing and the patient may have concern these could be cancer.   The following portions of the chart were reviewed this encounter and updated as appropriate: medications, allergies, medical history  Review of Systems:  No other skin or systemic complaints except as noted in HPI or Assessment and Plan.  Objective  Well appearing patient in no apparent distress; mood and affect are within normal limits.   A focused examination was performed of the following areas: Face, neck, chest, back, b/l inframammary   Relevant exam findings are noted in the Assessment and Plan.  left neck x 18, right neck x 11, right zygoma x 2, left mid back x 1, left flank x 1, left cheek x 2, left upper back x 1, right upper back x 1 (37) Erythematous stuck-on, waxy papule or plaque  Assessment & Plan    SEBORRHEIC KERATOSIS - Stuck-on, waxy, tan-brown papules and/or plaques  - Benign-appearing - Discussed benign etiology and prognosis. - Observe - Call for any changes  Acrochordons (Skin Tags) Inframammary  - Fleshy, skin-colored pedunculated papules - Benign appearing.  - Observe. - If desired, they can be removed with an in office procedure that is not covered by insurance. - Please call the clinic if you notice any new or changing lesions.   ACTINIC DAMAGE - chronic, secondary to cumulative UV radiation exposure/sun exposure over time - diffuse scaly erythematous macules with underlying dyspigmentation - Recommend daily broad spectrum sunscreen SPF 30+ to sun-exposed areas, reapply every 2 hours as  needed.  - Recommend staying in the shade or wearing long sleeves, sun glasses (UVA+UVB protection) and wide brim hats (4-inch brim around the entire circumference of the hat). - Call for new or changing lesions.  INFLAMED SEBORRHEIC KERATOSIS (37) left neck x 18, right neck x 11, right zygoma x 2, left mid back x 1, left flank x 1, left cheek x 2, left upper back x 1, right upper back x 1 (37) Symptomatic, irritating, patient would like treated. Destruction of lesion - left neck x 18, right neck x 11, right zygoma x 2, left mid back x 1, left flank x 1, left cheek x 2, left upper back x 1, right upper back x 1 (37)  Destruction method: cryotherapy   Informed consent: discussed and consent obtained   Lesion destroyed using liquid nitrogen: Yes   Region frozen until ice ball extended beyond lesion: Yes   Outcome: patient tolerated procedure well with no complications   Post-procedure details: wound care instructions given   Additional details:  Prior to procedure, discussed risks of blister formation, small wound, skin dyspigmentation, or rare scar following cryotherapy. Recommend Vaseline ointment to treated areas while healing.   Return if symptoms worsen or fail to improve, for 2 to 3 month isk follow up.  I, Randee Busing, CMA, am acting as scribe for Artemio Larry, MD.   Documentation: I have reviewed the above documentation for accuracy and completeness, and I agree with the above.  Artemio Larry, MD

## 2024-01-10 NOTE — Patient Instructions (Addendum)

## 2024-01-20 ENCOUNTER — Emergency Department
Admission: EM | Admit: 2024-01-20 | Discharge: 2024-01-20 | Discharge status: Against Medical Advice | Attending: Emergency Medicine | Admitting: Emergency Medicine

## 2024-01-20 ENCOUNTER — Other Ambulatory Visit: Payer: Self-pay

## 2024-01-20 DIAGNOSIS — R519 Headache, unspecified: Secondary | ICD-10-CM | POA: Diagnosis present

## 2024-01-20 DIAGNOSIS — Y92410 Unspecified street and highway as the place of occurrence of the external cause: Secondary | ICD-10-CM | POA: Diagnosis not present

## 2024-01-20 DIAGNOSIS — Z5321 Procedure and treatment not carried out due to patient leaving prior to being seen by health care provider: Secondary | ICD-10-CM | POA: Diagnosis not present
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
## 2024-03-10 ENCOUNTER — Other Ambulatory Visit (INDEPENDENT_AMBULATORY_CARE_PROVIDER_SITE_OTHER): Payer: Self-pay | Admitting: Vascular Surgery

## 2024-03-10 DIAGNOSIS — I739 Peripheral vascular disease, unspecified: Secondary | ICD-10-CM

## 2024-03-14 ENCOUNTER — Ambulatory Visit (INDEPENDENT_AMBULATORY_CARE_PROVIDER_SITE_OTHER): Admitting: Vascular Surgery

## 2024-03-14 ENCOUNTER — Encounter (INDEPENDENT_AMBULATORY_CARE_PROVIDER_SITE_OTHER): Payer: Self-pay | Admitting: Vascular Surgery

## 2024-03-14 ENCOUNTER — Other Ambulatory Visit (INDEPENDENT_AMBULATORY_CARE_PROVIDER_SITE_OTHER)

## 2024-03-14 VITALS — BP 145/76 | HR 69 | Resp 18 | Wt 226.8 lb

## 2024-03-14 DIAGNOSIS — E119 Type 2 diabetes mellitus without complications: Secondary | ICD-10-CM

## 2024-03-14 DIAGNOSIS — M79605 Pain in left leg: Secondary | ICD-10-CM

## 2024-03-14 DIAGNOSIS — I739 Peripheral vascular disease, unspecified: Secondary | ICD-10-CM

## 2024-03-14 DIAGNOSIS — M79609 Pain in unspecified limb: Secondary | ICD-10-CM | POA: Insufficient documentation

## 2024-03-14 DIAGNOSIS — I10 Essential (primary) hypertension: Secondary | ICD-10-CM | POA: Diagnosis not present

## 2024-03-14 DIAGNOSIS — M79604 Pain in right leg: Secondary | ICD-10-CM | POA: Diagnosis not present

## 2024-03-14 DIAGNOSIS — I82512 Chronic embolism and thrombosis of left femoral vein: Secondary | ICD-10-CM | POA: Diagnosis not present

## 2024-03-14 DIAGNOSIS — Z794 Long term (current) use of insulin: Secondary | ICD-10-CM

## 2024-03-14 NOTE — Assessment & Plan Note (Signed)
 Had appropriate treatment with anticoagulation.  No significant swelling.  Mild stasis changes but no further therapy planned at this point.

## 2024-03-14 NOTE — Progress Notes (Signed)
 Patient ID: Gina Tate, female   DOB: [birth year], 73 y.o.   MRN: [id number]  Chief Complaint  Patient presents with   New Patient (Initial Visit)    Ref Parris consult PVD and ischemia of extremity    HPI Gina Tate is a 73 y.o. female.  I am asked to see the patient by Dr. Aleskerov for evaluation of pain and discoloration of the lower extremities with concern for peripheral arterial disease.  I previously saw her about 6 to 7 years ago for left lower extremity DVT.  She was appropriately treated with anticoagulation.  She has had discoloration that is purplish in her feet and toes.  She has some pain in the bottom of her feet.  She has been diagnosed with neuropathy as well.  She does not have significant swelling.  The pain is not generally related to activity.  There was concern for possible arterial insufficiency as a contributing factor.  To evaluate her perfusion, noninvasive studies were performed today.  Her ABIs were 1.08 on the right and 1.05 on the left with triphasic waveforms and normal digital pressures and waveforms bilaterally.     Past Medical History:  Diagnosis Date   Anemia    Atypical chest pain    Bronchitis    chronic   Chronic cough    Dementia (HCC)    Depression    unspecified   Diabetes mellitus type 2, uncomplicated (HCC)    Diabetes mellitus, type II (HCC)    DVT (deep venous thrombosis) (HCC) 2018   after left total knee replacement   Dyspnea    since having covid in January 2022-Sees Dr Parris   Dysrhythmia    tachycardia   Elevated transaminase level    chronic, probably on basis of steatorrhea syndrome   Environmental and seasonal allergies    Fracture of right patella    not operated   GERD (gastroesophageal reflux disease)    Headache    Headache, variant migraine    Hiatal hernia    History of cervical dysplasia    status post two normal pap smears, 02/2002 and 05/2002, status post LEEP   History of hiatal hernia     HOH (hard of hearing)    Hypercholesteremia    Hyperlipidemia, unspecified    Hypertension    Hypothyroidism    Morton's neuroma, left    Obesity, unspecified    Osteoarthritis    Pneumonia    Stroke (HCC) 2008   TIA   Thyroid disease    TIA (transient ischemic attack)    Wheezing     Past Surgical History:  Procedure Laterality Date   BACK SURGERY     CATARACT EXTRACTION W/PHACO Right 02/15/2018   Procedure: CATARACT EXTRACTION PHACO AND INTRAOCULAR LENS PLACEMENT (IOC);  Surgeon: Mittie Gaskin, MD;  Location: [facility];  Service: Ophthalmology;  Laterality: Right;  US   00:55 AP% 16.3 CDE 8.99 Fluid pack lot # [id number]   CATARACT EXTRACTION W/PHACO Left 03/10/2018   Procedure: CATARACT EXTRACTION PHACO AND INTRAOCULAR LENS PLACEMENT (IOC);  Surgeon: Mittie Gaskin, MD;  Location: [facility];  Service: Ophthalmology;  Laterality: Left;  Lot # [id number] H US  0:55.6 AP 12.5% CDE 6.95   CERVICAL BIOPSY  W/ LOOP ELECTRODE EXCISION  12/07/2001   ESOPHAGOGASTRODUODENOSCOPY N/A 10/07/2015   Procedure: ESOPHAGOGASTRODUODENOSCOPY (EGD);  Surgeon: Deward CINDERELLA Piedmont, MD;  Location: [facility]COPY;  Service: Gastroenterology;  Laterality: N/A;   ESOPHAGOGASTRODUODENOSCOPY  04/28/1993   Revealed moderate grade 2 distal  esophagitis and small, intermittent hiatal hernia.    ESOPHAGOGASTRODUODENOSCOPY (EGD) WITH PROPOFOL  N/A 01/28/2021   Procedure: ESOPHAGOGASTRODUODENOSCOPY (EGD) WITH PROPOFOL ;  Surgeon: Maryruth Ole DASEN, MD;  Location: [facility] ENDOSCOPY;  Service: Endoscopy;  Laterality: N/A;  DM   JOINT REPLACEMENT     SPINE SURGERY     x 2 lower back   TONSILLECTOMY     TOTAL HIP ARTHROPLASTY Left 04/01/2021   Procedure: TOTAL HIP ARTHROPLASTY ANTERIOR APPROACH;  Surgeon: Kathlynn Sharper, MD;  Location: [facility];  Service: Orthopedics;  Laterality: Left;   TOTAL KNEE ARTHROPLASTY Left 04/22/2017   Procedure: TOTAL KNEE ARTHROPLASTY;  Surgeon: Kathlynn Sharper, MD;  Location: [facility];   Service: Orthopedics;  Laterality: Left;   TRIGGER FINGER RELEASE Left    TRIGGER FINGER RELEASE Right 04/29/2015   right long finger  ; Dr. Kathlynn   TUBAL LIGATION       Family History  Problem Relation Age of Onset   Anemia Mother    Alzheimer's disease Mother    Osteoporosis Mother    Heart attack Father    Angina Father    Alzheimer's disease Father    Depression Father    Alcohol abuse Father    Colon polyps Father    Diabetes Sister    Heart disease Sister    Heart disease Brother    Bipolar disorder Brother    Diabetes Brother    Heart disease Sister    Cervical cancer Sister        in her 39's, S/P hysterectomy   Dementia Sister    Diabetes Brother    Heart attack Brother    Heart disease Brother    Cancer Brother    Stomach cancer Maternal Grandmother        COD   Thyroid cancer Daughter    Melanoma Daughter        stage III      Social History   Tobacco Use   Smoking status: Never   Smokeless tobacco: Never  Vaping Use   Vaping status: Never Used  Substance Use Topics   Alcohol use: No    Alcohol/week: 0.0 standard drinks of alcohol   Drug use: No     No Known Allergies  Current Outpatient Medications  Medication Sig Dispense Refill   acetaminophen  (TYLENOL ) 500 MG tablet Take 500 mg by mouth every 6 (six) hours as needed for moderate pain.     atenolol  (TENORMIN ) 25 MG tablet Take 25 mg by mouth daily with supper.  0   atorvastatin  (LIPITOR) 10 MG tablet Take 10 mg by mouth daily at 6 PM.   0   budesonide-formoterol (SYMBICORT) 160-4.5 MCG/ACT inhaler Inhale into the lungs.     docusate sodium  (COLACE) 100 MG capsule Take 1 capsule (100 mg total) by mouth 2 (two) times daily. 15 capsule 0   escitalopram  (LEXAPRO ) 20 MG tablet Take 20 mg by mouth every morning.     EUTHYROX  137 MCG tablet Take 137 mcg by mouth daily before breakfast.     ferrous sulfate  325 (65 FE) MG tablet Take 325 mg by mouth daily with breakfast.      insulin  NPH-regular  Human (NOVOLIN 70/30) (70-30) 100 UNIT/ML injection Inject 10-40 Units into the skin See admin instructions. Inject 10 units in the morning and 25-40 units in the evening     lansoprazole (PREVACID) 15 MG capsule Take 15 mg by mouth daily with supper.     losartan  (COZAAR ) 100 MG tablet Take  100 mg by mouth every morning.     metFORMIN  (GLUCOPHAGE ) 1000 MG tablet Take 1,000 mg by mouth 2 (two) times daily with a meal.      pregabalin (LYRICA) 25 MG capsule Stop gabapentin.  Start Lyrica 25 mg twice daily for neuropathy     venlafaxine  XR (EFFEXOR -XR) 75 MG 24 hr capsule Take 75 mg by mouth at bedtime.     amoxicillin -clavulanate (AUGMENTIN ) 875-125 MG tablet Take 1 tablet by mouth 2 (two) times daily. (Patient not taking: Reported on 03/14/2024) 20 tablet 0   enoxaparin  (LOVENOX ) 40 MG/0.4ML injection Inject 0.4 mLs (40 mg total) into the skin daily for 14 days. (Patient not taking: Reported on 03/14/2024) 5.6 mL 0   No current facility-administered medications for this visit.      REVIEW OF SYSTEMS (Negative unless checked)  Constitutional: [] Weight loss  [] Fever  [] Chills Cardiac: [] Chest pain   [] Chest pressure   [] Palpitations   [] Shortness of breath when laying flat   [] Shortness of breath at rest   [x] Shortness of breath with exertion. Vascular:  [x] Pain in legs with walking   [] Pain in legs at rest   [] Pain in legs when laying flat   [x] Claudication   [] Pain in feet when walking  [] Pain in feet at rest  [] Pain in feet when laying flat   [x] History of DVT   [] Phlebitis   [] Swelling in legs   [] Varicose veins   [] Non-healing ulcers Pulmonary:   [] Uses home oxygen   [] Productive cough   [] Hemoptysis   [] Wheeze  [] COPD   [] Asthma Neurologic:  [] Dizziness  [] Blackouts   [] Seizures   [] History of stroke   [] History of TIA  [] Aphasia   [] Temporary blindness   [] Dysphagia   [] Weakness or numbness in arms   [] Weakness or numbness in legs Musculoskeletal:  [x] Arthritis   [] Joint swelling   [] Joint  pain   [x] Low back pain Hematologic:  [] Easy bruising  [] Easy bleeding   [] Hypercoagulable state   [x] Anemic  [] Hepatitis Gastrointestinal:  [] Blood in stool   [] Vomiting blood  [x] Gastroesophageal reflux/heartburn   [] Abdominal pain Genitourinary:  [] Chronic kidney disease   [] Difficult urination  [] Frequent urination  [] Burning with urination   [] Hematuria Skin:  [] Rashes   [] Ulcers   [] Wounds Psychological:  [] History of anxiety   []  History of major depression.    Physical Exam BP (!) 145/76   Pulse 69   Resp 18   Wt 226 lb 12.8 oz (102.9 kg)   BMI 37.74 kg/m  Gen:  WD/WN, NAD Head: [place]/AT, No temporalis wasting.  Ear/Nose/Throat: Hearing grossly intact, nares w/o erythema or drainage, oropharynx w/o Erythema/Exudate Eyes: Conjunctiva clear, sclera non-icteric  Neck: trachea midline.  No JVD.  Pulmonary:  Good air movement, respirations not labored, no use of accessory muscles  Cardiac: RRR, no JVD Vascular:  Vessel Right Left  Radial Palpable Palpable                          DP Palpable Palpable  PT Palpable Palpable   Gastrointestinal:. No masses, surgical incisions, or scars. Musculoskeletal: M/S 5/5 throughout.  Extremities without ischemic changes.  No deformity or atrophy.  Mild venous stasis changes are present bilaterally.  No significant lower extremity edema. Neurologic: Sensation grossly intact in extremities.  Symmetrical.  Speech is fluent. Motor exam as listed above. Psychiatric: Judgment intact, Mood & affect appropriate for pt's clinical situation. Dermatologic: No rashes or ulcers noted.  No cellulitis or  open wounds.    Radiology No results found.  Labs No results found for this or any previous visit (from the past 2160 hours).  Assessment/Plan:  Pain in limb To evaluate her perfusion, noninvasive studies were performed today.  Her ABIs were 1.08 on the right and 1.05 on the left with triphasic waveforms and normal digital pressures and  waveforms bilaterally.   Given this finding, the most likely cause of her lower extremity symptoms of discoloration and pain are neuropathy.  I will defer to her primary care physician consideration for treatment with Neurontin or Lyrica going forward.  She has a history of DVT but does not have significant swelling.  She may have some venous stasis dermatitis related to that.  She can follow-up as needed.  Type 2 diabetes mellitus treated with insulin  (HCC) blood glucose control important in reducing the progression of atherosclerotic disease. Also, involved in wound healing and likely a cause of her neuropathy. On appropriate medications.   Benign essential HTN blood pressure control important in reducing the progression of atherosclerotic disease. On appropriate oral medications.   DVT (deep venous thrombosis) (HCC) Had appropriate treatment with anticoagulation.  No significant swelling.  Mild stasis changes but no further therapy planned at this point.      Selinda Gu 03/14/2024, 11:53 AM   This note was created with Dragon medical transcription system.  Any errors from dictation are unintentional.

## 2024-03-14 NOTE — Assessment & Plan Note (Signed)
 To evaluate her perfusion, noninvasive studies were performed today.  Her ABIs were 1.08 on the right and 1.05 on the left with triphasic waveforms and normal digital pressures and waveforms bilaterally.   Given this finding, the most likely cause of her lower extremity symptoms of discoloration and pain are neuropathy.  I will defer to her primary care physician consideration for treatment with Neurontin or Lyrica going forward.  She has a history of DVT but does not have significant swelling.  She may have some venous stasis dermatitis related to that.  She can follow-up as needed.

## 2024-03-14 NOTE — Assessment & Plan Note (Signed)
 blood glucose control important in reducing the progression of atherosclerotic disease. Also, involved in wound healing and likely a cause of her neuropathy. On appropriate medications.

## 2024-03-14 NOTE — Assessment & Plan Note (Signed)
 blood pressure control important in reducing the progression of atherosclerotic disease. On appropriate oral medications.

## 2024-03-15 LAB — VAS US ABI WITH/WO TBI
Left ABI: 1.05
Right ABI: 1.08

## 2024-04-18 ENCOUNTER — Ambulatory Visit: Admitting: Dermatology

## 2024-04-18 DIAGNOSIS — L814 Other melanin hyperpigmentation: Secondary | ICD-10-CM

## 2024-04-18 DIAGNOSIS — L820 Inflamed seborrheic keratosis: Secondary | ICD-10-CM | POA: Diagnosis not present

## 2024-04-18 DIAGNOSIS — L918 Other hypertrophic disorders of the skin: Secondary | ICD-10-CM

## 2024-04-18 DIAGNOSIS — L821 Other seborrheic keratosis: Secondary | ICD-10-CM | POA: Diagnosis not present

## 2024-04-18 NOTE — Patient Instructions (Addendum)

## 2024-04-18 NOTE — Progress Notes (Signed)
   Follow-Up Visit   Subjective  Gina Tate is a 73 y.o. female who presents for the following: follow-up Inflamed Sks. She has residual spots on the right zygoma. She also has itchy spots on the right forearm, left hand, back, and right abdomen. She scratches and they bleed.   The patient has spots, moles and lesions to be evaluated, some may be new or changing.   The following portions of the chart were reviewed this encounter and updated as appropriate: medications, allergies, medical history  Review of Systems:  No other skin or systemic complaints except as noted in HPI or Assessment and Plan.  Objective  Well appearing patient in no apparent distress; mood and affect are within normal limits.  A focused examination was performed of the following areas: Face, trunk, arms  Relevant physical exam findings are noted in the Assessment and Plan.  R forearm x 1, L hand x 1, R abdomen x 2, L back x 3, R lower temple x 3, R chest x 1, R flank x 2 (13) Erythematous stuck-on, waxy papule or plaque  Assessment & Plan  SEBORRHEIC KERATOSIS - Stuck-on, waxy, tan-brown papules and/or plaques  - Benign-appearing - Discussed benign etiology and prognosis. - Observe - Call for any changes  Acrochordons (Skin Tags) - Fleshy, skin-colored pedunculated papules, inframammary - Benign appearing.  - Observe. - If desired, they can be removed with an in office procedure that is not covered by insurance. - Please call the clinic if you notice any new or changing lesions.  LENTIGINES Exam: scattered tan macules Due to sun exposure Treatment Plan: Benign-appearing, observe. Recommend daily broad spectrum sunscreen SPF 30+ to sun-exposed areas, reapply every 2 hours as needed.  Call for any changes   INFLAMED SEBORRHEIC KERATOSIS (13) R forearm x 1, L hand x 1, R abdomen x 2, L back x 3, R lower temple x 3, R chest x 1, R flank x 2 (13) Symptomatic, irritating, patient would like  treated. Destruction of lesion - R forearm x 1, L hand x 1, R abdomen x 2, L back x 3, R lower temple x 3, R chest x 1, R flank x 2 (13)  Destruction method: cryotherapy   Informed consent: discussed and consent obtained   Lesion destroyed using liquid nitrogen: Yes   Region frozen until ice ball extended beyond lesion: Yes   Outcome: patient tolerated procedure well with no complications   Post-procedure details: wound care instructions given   Additional details:  Prior to procedure, discussed risks of blister formation, small wound, skin dyspigmentation, or rare scar following cryotherapy. Recommend Vaseline ointment to treated areas while healing.     Return in about 6 months (around 10/19/2024) for ISKs.  IAndrea Kerns, CMA, am acting as scribe for Rexene Rattler, MD .   Documentation: I have reviewed the above documentation for accuracy and completeness, and I agree with the above.  Rexene Rattler, MD

## 2024-10-16 ENCOUNTER — Ambulatory Visit: Admitting: Dermatology
